# Patient Record
Sex: Female | Born: 1975 | Race: Black or African American | Hispanic: No | Marital: Single | Smoking: Never smoker
Health system: Southern US, Community
[De-identification: ages and names within clinical notes are randomized; demographics above are authoritative.]

## PROBLEM LIST (undated history)

## (undated) DIAGNOSIS — E559 Vitamin D deficiency, unspecified: Principal | ICD-10-CM

## (undated) DIAGNOSIS — T7840XA Allergy, unspecified, initial encounter: Secondary | ICD-10-CM

## (undated) DIAGNOSIS — I219 Acute myocardial infarction, unspecified: Secondary | ICD-10-CM

## (undated) DIAGNOSIS — D649 Anemia, unspecified: Secondary | ICD-10-CM

## (undated) DIAGNOSIS — E119 Type 2 diabetes mellitus without complications: Secondary | ICD-10-CM

## (undated) DIAGNOSIS — N289 Disorder of kidney and ureter, unspecified: Secondary | ICD-10-CM

## (undated) DIAGNOSIS — Z87442 Personal history of urinary calculi: Secondary | ICD-10-CM

## (undated) DIAGNOSIS — Z86718 Personal history of other venous thrombosis and embolism: Secondary | ICD-10-CM

## (undated) HISTORY — DX: Type 2 diabetes mellitus without complications: E11.9

## (undated) HISTORY — DX: Personal history of urinary calculi: Z87.442

## (undated) HISTORY — PX: WISDOM TOOTH EXTRACTION: SHX21

## (undated) HISTORY — DX: Anemia, unspecified: D64.9

## (undated) HISTORY — DX: Acute myocardial infarction, unspecified: I21.9

## (undated) HISTORY — DX: Vitamin D deficiency, unspecified: E55.9

## (undated) HISTORY — DX: Allergy, unspecified, initial encounter: T78.40XA

## (undated) HISTORY — DX: Personal history of other venous thrombosis and embolism: Z86.718

---

## 1999-07-10 HISTORY — PX: CHOLECYSTECTOMY: SHX55

## 2006-07-09 DIAGNOSIS — Z5189 Encounter for other specified aftercare: Secondary | ICD-10-CM

## 2006-07-09 HISTORY — DX: Encounter for other specified aftercare: Z51.89

## 2013-09-01 ENCOUNTER — Ambulatory Visit: Payer: Self-pay | Admitting: Internal Medicine

## 2013-09-15 ENCOUNTER — Ambulatory Visit: Payer: Self-pay | Admitting: Internal Medicine

## 2013-10-06 ENCOUNTER — Encounter: Payer: Self-pay | Admitting: Internal Medicine

## 2013-10-06 ENCOUNTER — Ambulatory Visit (INDEPENDENT_AMBULATORY_CARE_PROVIDER_SITE_OTHER): Payer: 59 | Admitting: Internal Medicine

## 2013-10-06 VITALS — BP 120/88 | HR 85 | Temp 98.1°F | Ht 66.5 in | Wt 191.0 lb

## 2013-10-06 DIAGNOSIS — E1059 Type 1 diabetes mellitus with other circulatory complications: Secondary | ICD-10-CM | POA: Insufficient documentation

## 2013-10-06 DIAGNOSIS — IMO0002 Reserved for concepts with insufficient information to code with codable children: Secondary | ICD-10-CM

## 2013-10-06 DIAGNOSIS — E1065 Type 1 diabetes mellitus with hyperglycemia: Secondary | ICD-10-CM | POA: Insufficient documentation

## 2013-10-06 LAB — LIPID PANEL
Cholesterol: 200 mg/dL (ref 0–200)
HDL: 56.4 mg/dL (ref >39.00)
LDL Cholesterol: 125 mg/dL — ABNORMAL HIGH (ref 0–99)
Total CHOL/HDL Ratio: 4
Triglycerides: 91 mg/dL (ref 0.0–149.0)
VLDL: 18.2 mg/dL (ref 0.0–40.0)

## 2013-10-06 LAB — COMPREHENSIVE METABOLIC PANEL
ALT: 12 U/L (ref 0–35)
AST: 19 U/L (ref 0–37)
Albumin: 3.5 g/dL (ref 3.5–5.2)
Alkaline Phosphatase: 126 U/L — ABNORMAL HIGH (ref 39–117)
BILIRUBIN TOTAL: 0.6 mg/dL (ref 0.3–1.2)
BUN: 11 mg/dL (ref 6–23)
CO2: 23 meq/L (ref 19–32)
Calcium: 8.7 mg/dL (ref 8.4–10.5)
Chloride: 100 mEq/L (ref 96–112)
Creatinine, Ser: 0.9 mg/dL (ref 0.4–1.2)
GFR: 87.41 mL/min (ref >60.00)
GLUCOSE: 351 mg/dL — AB (ref 70–99)
Potassium: 4.2 mEq/L (ref 3.5–5.1)
SODIUM: 131 meq/L — AB (ref 135–145)
TOTAL PROTEIN: 8 g/dL (ref 6.0–8.3)

## 2013-10-06 LAB — MICROALBUMIN / CREATININE URINE RATIO
CREATININE, U: 135.4 mg/dL
Microalb Creat Ratio: 1.8 mg/g (ref 0.0–30.0)
Microalb, Ur: 2.5 mg/dL — ABNORMAL HIGH (ref 0.0–1.9)

## 2013-10-06 LAB — TSH: TSH: 0.47 u[IU]/mL (ref 0.35–5.50)

## 2013-10-06 MED ORDER — INSULIN PEN NEEDLE 32G X 4 MM MISC: Status: DC

## 2013-10-06 MED ORDER — GLUCOSE BLOOD VI STRP: ORAL_STRIP | Status: DC

## 2013-10-06 MED ORDER — INSULIN ASPART 100 UNIT/ML ~~LOC~~ SOLN: SUBCUTANEOUS | Status: DC

## 2013-10-06 MED ORDER — INSULIN GLARGINE 100 UNIT/ML ~~LOC~~ SOLN: 26.0000 [IU] | Freq: Every day | SUBCUTANEOUS | Status: DC

## 2013-10-06 MED ORDER — FREESTYLE LANCETS MISC
Status: DC
Start: 2013-10-06 — End: 2020-09-28

## 2013-10-06 NOTE — Progress Notes (Signed)
Patient ID: Abigail Moran, female   DOB: 1975-12-25, 38 y.o.   MRN: [id number]  HPI: Abigail Moran is a 38 y.o.-year-old female, referred by ObGyn Dr Linda Hedges, for management of DM1, uncontrolled, without complications. She does not have a PCP yet. She had problems with the insurance in the past >> now [facility] since 09/06/2013.  Patient has been diagnosed with diabetes in 1997; she started insulin in 2001. She was on an insulin pump when pregnant >> sugars great but lost the baby at 6 months >> would not want to restart.  Last hemoglobin A1c was: 10% in 08/2013.  She had admissions for DKA multiple times as a teenager. 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 She was on Metformin >> severe N/V. + coriander + cinnamon + slippery elm  + digestive enzymes  Pt checks her sugars 1-2x a day and they are: - am: 85-150 - 2h after b'fast: n/c - before lunch: n/c - 2h after lunch: n/c - before dinner: n/c - 2h after dinner: n/c - bedtime: 220-320-HI - nighttime: n/c No lows. Lowest sugar was 85 in last 3 mo; she has hypoglycemia awareness at 60-70.  Highest sugar was HI.  Pt's meals are mostly vegetarian. Exercise: Zumba, African dance  - no CKD, but no records available. - no lipid panel available - last eye exam was in 8 years. No DR.  - + numbness and tingling in her feet. Cayenne pepper helps.  Pt has FH of DM in father and PGM.   ROS: Constitutional: no weight gain/loss, no fatigue, no subjective hyperthermia/hypothermia Eyes: no blurry vision, no xerophthalmia ENT: no sore throat, no nodules palpated in throat, no dysphagia/odynophagia, no hoarseness Cardiovascular: no CP/SOB/palpitations/leg swelling Respiratory: no cough/SOB Gastrointestinal: no  N/V/D/C Musculoskeletal: no muscle/joint aches Skin: no rashes Neurological: no tremors/numbness/tingling/dizziness Psychiatric: no depression/anxiety  PMH:  - kidney stones  History   Social History  . Marital Status: Single    Spouse Name: N/A    Number of Children: 0   Occupational History  . 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 Smoking status: Never Smoker   . Smokeless tobacco: Not on file  . Alcohol Use: No  . Drug Use: Cannabis occasionally   Meds: only on Lantus and NovoLog - see HPI  Allergies  Allergen Reactions  . Latex     Hives   PE: BP 120/88  Pulse 85  Temp(Src) 98.1 F (36.7 C) (Oral)  Ht 5' 6.5" (1.689 m)  Wt 191 lb (86.637 kg)  BMI 30.37 kg/m2  SpO2 98% Wt Readings from Last 3 Encounters:  10/06/13 191 lb (86.637 kg)   Constitutional: overweight, in NAD Eyes: PERRLA, EOMI, no exophthalmos ENT: moist mucous membranes, no thyromegaly, no cervical lymphadenopathy Cardiovascular: RRR, No MRG Respiratory: CTA B Gastrointestinal: abdomen soft, NT, ND, BS+ Musculoskeletal: no deformities, strength intact in all 4 Skin: moist, warm, no rashes Neurological: no tremor with outstretched hands, DTR normal in all 4  ASSESSMENT: 1. DM1, insulin-dependent, uncontrolled, without complications - refuses a pump  PLAN:  1. Patient with long-standing, uncontrolled diabetes, on basal insulin, but noncompliant with bolus insulin - We discussed about options for treatment, and I suggested  to:  Patient Instructions  Please decrease Lantus from 30 to 26 units qhs Please adjust the Novolog with meals as follows: - small meal: 6 units - medium meal: 8 units  - large meal: 10 units Add the following Sliding scale of NovoLog: - 150-175: + 1 unit  - 176-200: + 2 units  - 201-225: + 3 units  - 226-250: + 4 units  - > 251: + 5 units Please return in 1 month with your sugar log.  Please do not miss insulin doses. When injecting  insulin:  Inject in the abdomen  Rotate the injection sites around the belly button  Change needle for each injection  Keep needle in for 10 sec after last unit of insulin in  Keep the insulin in use out of the fridge  - Strongly advised her to start checking sugars at different times of the day - check 3-4 times a day, rotating checks - given sugar log and advised how to fill it and to bring it at next appt  - given foot care handout and explained the principles  - given instructions for hypoglycemia management "15-15 rule"  - advised for yearly eye exams - check the following today: Orders Placed This Encounter  Procedures  . Comp Met (CMET)  . Lipid Profile  . TSH  . Microalbumin / creatinine urine ratio  - Return to clinic in 1 mo with sugar log   Letter sent: Dear Ms. Westrich,  Below are the results from your recent visit:  COMPREHENSIVE METABOLIC PANEL      Result Value Ref Range   Sodium 131 (*) 135 - 145 mEq/L   Potassium 4.2  3.5 - 5.1 mEq/L   Chloride 100  96 - 112 mEq/L   CO2 23  19 - 32 mEq/L   Glucose, Bld 351 (*) 70 - 99 mg/dL   BUN 11  6 - 23 mg/dL   Creatinine, Ser 0.9  0.4 - 1.2 mg/dL   Total Bilirubin 0.6  0.3 - 1.2 mg/dL   Alkaline Phosphatase 126 (*) 39 - 117 U/L   AST 19  0 - 37 U/L   ALT 12  0 - 35 U/L   Total Protein 8.0  6.0 - 8.3 g/dL   Albumin 3.5  3.5 - 5.2 g/dL   Calcium 8.7  8.4 - 10.5 mg/dL   GFR 87.41  >60.00 mL/min  LIPID PANEL      Result Value Ref Range   Cholesterol 200  0 - 200 mg/dL   Triglycerides 91.0  0.0 - 149.0 mg/dL   HDL 56.40  >39.00 mg/dL   VLDL 18.2  0.0 - 40.0 mg/dL   LDL Cholesterol 125 (*) 0 - 99 mg/dL   Total CHOL/HDL Ratio 4    TSH      Result Value Ref Range   TSH 0.47  0.35 - 5.50 uIU/mL  MICROALBUMIN / CREATININE URINE RATIO      Result Value Ref Range   Microalb, Ur 2.5 (*) 0.0 - 1.9 mg/dL   Creatinine,U 135.4     Microalb Creat Ratio 1.8  0.0 - 30.0 mg/g   The test results show that your diabetes  is very poorly controlled, and the sugar is high, at 351. The urinary proteins are not high, which is great! The cholesterol level is OK, with the bad cholesterol LDL a little high. The thyroid test is great.  If you have any questions or concerns, please don't hesitate to call.  Sincerely,  Philemon Kingdom, MD

## 2013-10-06 NOTE — Patient Instructions (Signed)
Please decrease Lantus from 30 to 26 units qhs Please adjust the Novolog with meals as follows: - small meal: 6 units - medium meal: 8 units  - large meal: 10 units Add the following Sliding scale of NovoLog: - 150-175: + 1 unit  - 176-200: + 2 units  - 201-225: + 3 units  - 226-250: + 4 units  - > 251: + 5 units Please return in 1 month with your sugar log.  Please do not miss insulin doses. When injecting insulin:  Inject in the abdomen  Rotate the injection sites around the belly button  Change needle for each injection  Keep needle in for 10 sec after last unit of insulin in  Keep the insulin in use out of the fridge   Basic Carbohydrate Counting Basic carbohydrate counting is a way to plan meals. It is done by counting the amount of carbohydrate in foods. Foods that have carbohydrates are starches (grains, beans, starchy vegetables) and sweets. Eating carbohydrates increases blood glucose (sugar) levels. People with diabetes use carbohydrate counting to help keep their blood glucose at a normal level.  COUNTING CARBOHYDRATES IN FOODS The first step in counting carbohydrates is to learn how many carbohydrate servings you should have in every meal. A dietitian can plan this for you. After learning the amount of carbohydrates to include in your meal plan, you can start to choose the carbohydrate-containing foods you want to eat.  There are 2 ways to identify the amount of carbohydrates in the foods you eat.  Read the Nutrition Facts panel on food labels. You need 2 pieces of information from the Nutrition Facts panel to count carbohydrates this way:  Serving size.  Total carbohydrate (in grams). Decide how many servings you will be eating. If it is 1 serving, you will be eating the amount of carbohydrate listed on the panel. If you will be eating 2 servings, you will be eating double the amount of carbohydrate listed on the panel.   Learn serving sizes. A serving size of most  carbohydrate-containing foods is about 15 grams (g). Listed below are single serving sizes of common carbohydrate-containing foods:  1 slice bread.   cup unsweetened, dry cereal.   cup hot cereal.   cup rice.   cup mashed potatoes.   cup pasta.  1 cup fresh fruit.   cup canned fruit.  1 cup milk (whole, 2%, or skim).   cup starchy vegetables (peas, corn, or potatoes). Counting carbohydrates this way is similar to looking on the Nutrition Facts panel. Decide how many servings you will eat first. Multiply the number of servings you eat by 15 g. For example, if you have 2 cups of strawberries, you had 2 servings. That means you had 30 g of carbohydrate (2 servings x 15 g = 30 g). CALCULATING CARBOHYDRATES IN A MEAL Sample dinner  3 oz chicken breast.   cup brown rice.   cup corn.  1 cup fat-free milk.  1 cup strawberries with sugar-free whipped topping. Carbohydrate calculation First, identify the foods that contain carbohydrate:  Rice.  Corn.  Milk.  Strawberries. Calculate the number of servings eaten:  2 servings rice.  1 serving corn.  1 serving milk.  1 serving strawberries. Multiply the number of servings by 15 g:  2 servings rice x 15 g = 30 g.  1 serving corn x 15 g = 15 g.  1 serving milk x 15 g = 15 g.  1 serving strawberries x  15 g = 15 g. Add the amounts to find the total carbohydrates eaten: 30 g + 15 g + 15 g + 15 g = 75 g carbohydrate eaten at dinner. Document Released: 06/25/2005 Document Revised: 09/17/2011 Document Reviewed: 05/11/2011 [facility] Patient Information 2014 [place], [place].  Reading Food Labels Foods that are in packaging or containers will often have a Nutrition Facts panel on its side or back. The Nutrition Facts panel provides the nutritional value of the food. This information is helpful when determining healthy food choices. By reading food labels, you will find out the serving size of a food and how many  servings the package has. You will also find information about the calorie and fat content, as well as the amount of carbohydrate, and vitamins and minerals. Food labels are a great reference for you to use to learn about the food you are eating. BREAKING DOWN THE FOOD LABEL Serving Size: The serving size is an amount of food and is often listed in cups, weight, or units. All of the nutrition information about the food is listed according to the serving size. If you double the serving size, you must double the amounts on the label.  Servings per [employer] FoodsContainer or Package: The number of servings in the container is listed here.  Calories: The number of calories in one serving is listed here. Everyone needs a different amount of calories each day. Having calories listed on the label is helpful information for people who would like to keep track of the number of calories they eat to stay at a healthy weight. Calories from Fat:  The number of calories that come from fat in one serving are listed here.  NUTRIENTS THAT ARE LISTED ON THE FOOD LABEL.   Percent Daily Value: The food label helps you know if you are getting the amounts of nutrients you need each day by the percent daily value. It tells you how much of your daily values of each nutrient are provided by one serving of the food. The percent daily value is based on a 2000 calorie diet. You may need more or less than 2000 calories each day.  Total Fat: The total amount of fat in one serving is listed here. The number is shown in grams (g). This information is important for people who want to keep track of the amount of fat in their diet. Foods with high amounts of fat usually have higher calories and may lead to weight gain.  Saturated Fat:  The amount of saturated fat in one serving is listed here. It is also shown in grams. Saturated fat is one type of fat that is found in food. It increases the amount of blood cholesterol more than other types of fat  found in food. So saturated fat should be limited in the diet to less than 7 percent of total calories each day for most people. This means that if a person eats 2000 calories each day, they should eat less than 140 calories from saturated fat.  Trans Fat: The amount of trans fat in one serving is listed here. It is also shown in grams. Trans fat is another type of fat that is found in food. It should also be limited to less than 2 grams per day because it increases blood cholesterol.  Cholesterol: The amount of cholesterol in one serving is listed here. It is shown in milligrams (mg). Cholesterol should be limited to no more than 200 mg each day.  Sodium: The  amount of sodium in one serving is listed here. It is shown in milligrams. American Heart Association recommends that sodium should be limited to 1500mg /day. This recommended level of sodium was recently lowered from 2400mg /day.  Total Carbohydrate: The amount of carbohydrate in one serving is listed here. It is shown in grams. This information is important for people with diabetes because they need to manage the amount of carbohydrate they eat. Carbohydrate changes the amount of glucose or sugar in the blood and diabetics do not want that amount to be too high or too low.  Dietary Fiber:  The amount of dietary fiber in one serving is listed here. It is shown in grams. Fiber is a type of carbohydrate. Most people should eat 25 grams of dietary fiber each day.  Sugars: The amount of sugar in one serving is listed here. It is shown in grams. Sugars are also a type of carbohydrate. This value includes both naturally occurring sugars from fruit and milk and added sugars such as honey or table sugar.  Protein: The amount of protein in one serving is listed here. It is shown in grams.  Vitamins and Minerals: Food labels list vitamin A, vitamin C, calcium and iron. They are all shown as a percent of the daily need one serving of the food provides.  For example, if 15% is listed next to iron it means that one serving of that food will give you 15% of the total amount of iron you need for one day.  Calories per Gram: Some food labels will list the number of calories that are in each gram or protein, carbohydrate and fat. Protein has four calories per gram, carbohydrate has four calories per gram, and fat has 9 calories per gram.  Ingredients: Food labels will list each ingredient in the food. The first ingredient listed is the ingredient that the food has the most of. The ingredients are listed in the order of their amount from highest to lowest.  Contains: Food labels may also include this portion of the label as a food allergen warning. Listed here are ingredients that can cause allergies in some people. Examples of ingredients that are listed are wheat, dairy, eggs, soy and nuts. If a person knows that are allergic to one of these ingredients they will know not to eat the food in the container. Information from www.eatright.[place] Servant Nutritional Analysis Database, ADA Nutrition Care Manual. Document Released: 06/25/2005 Document Revised: 09/17/2011 Document Reviewed: 11/08/2008 [facility] Patient Information 2014 [place], [place].

## 2013-11-03 ENCOUNTER — Ambulatory Visit: Payer: 59 | Admitting: Internal Medicine

## 2013-11-17 ENCOUNTER — Encounter: Payer: Self-pay | Admitting: Internal Medicine

## 2013-11-17 ENCOUNTER — Ambulatory Visit (INDEPENDENT_AMBULATORY_CARE_PROVIDER_SITE_OTHER): Payer: 59 | Admitting: Internal Medicine

## 2013-11-17 VITALS — BP 112/68 | HR 88 | Temp 97.9°F | Resp 12 | Wt 194.0 lb

## 2013-11-17 DIAGNOSIS — E1065 Type 1 diabetes mellitus with hyperglycemia: Secondary | ICD-10-CM

## 2013-11-17 DIAGNOSIS — IMO0002 Reserved for concepts with insufficient information to code with codable children: Secondary | ICD-10-CM

## 2013-11-17 NOTE — Patient Instructions (Signed)
Continue Lantus 26 units qhs Continue Novolog with meals as follows: - small meal: 6 units - medium meal: 8 units  - large meal: 10 units Continue Sliding scale of NovoLog: - 150-175: + 1 unit  - 176-200: + 2 units  - 201-225: + 3 units  - 226-250: + 4 units  - > 251: + 5 units Please return in 1 month with your sugar log.  Please do not miss insulin doses or meals

## 2013-11-17 NOTE — Progress Notes (Addendum)
Patient ID: Abigail Moran, female   DOB: Oct 26, 1975, 38 y.o.   MRN: [id number]  HPI: Abigail Moran is a 38 y.o.-year-old female, initially referred by ObGyn Dr Mitchel Honour, for management of DM1, dx 1997, stated insulin in 2001, uncontrolled, without complications. She does not have a PCP yet. She had problems with the insurance in the past >> now [facility] since 09/06/2013. Last visit 1.5 mo ago.  She was on an insulin pump when pregnant >> sugars great but lost the baby at 6 months >> would not want to restart.   Last hemoglobin A1c was:  10% in 08/2013.  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 - small meal: 6 units - medium meal: 8 units  - large meal: 10 units Still taking the mealtime insulin 1-2x a day, rather than  - Sliding scale of NovoLog: - 150-175: + 1 unit  - 176-200: + 2 units  - 201-225: + 3 units  - 226-250: + 4 units  - > 251: + 5 units She was on Metformin >> severe N/V. + coriander + cinnamon + slippery elm  + digestive enzymes  Pt checks her sugars 1-2x a day and they are: - am: 85-150 >> 60-100 - 2h after b'fast: n/c - before lunch: n/c - 2h after lunch: n/c - before dinner: n/c - 2h after dinner: n/c - bedtime: 220-320-HI >> 200-HI (if HI, takes 10 units of NovoLog) - nighttime: n/c No lows. Lowest sugar was 60 in last 3 mo; she has hypoglycemia awareness at 60-70.  Highest sugar was HI.  Pt's meals are mostly vegetarian. Exercise: Zumba, African dance  - no CKD: Lab Results  Component Value Date   BUN 11 10/06/2013   CREATININE 0.9 10/06/2013  Last ACR in 09/2013 >> normal. - Lipids: Lab Results  Component Value Date   CHOL 200 10/06/2013   HDL 56.40 10/06/2013   LDLCALC 125* 10/06/2013   TRIG 91.0 10/06/2013   CHOLHDL 4 10/06/2013   - last eye exam was in 8 years. No DR.  - + numbness and tingling in her  feet. Cayenne pepper helps. She had admissions for DKA multiple times as a teenager. 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   I reviewed pt's medications, allergies, PMH, social hx, family hx and no changes required, except as mentioned above.  ROS: Constitutional: no weight gain/loss, no fatigue, no subjective hyperthermia/hypothermia Eyes: no blurry vision, no xerophthalmia ENT: no sore throat, no nodules palpated in throat, no dysphagia/odynophagia, no hoarseness Cardiovascular: no CP/SOB/palpitations/leg swelling Respiratory: no cough/SOB Gastrointestinal: no N/V/D/C Musculoskeletal: no muscle/joint aches Skin: no rashes Neurological: no tremors/numbness/tingling/dizziness  PE: BP 112/68  Pulse 88  Temp(Src) 97.9 F (36.6 C) (Oral)  Resp 12  Wt 194 lb (87.998 kg)  SpO2 98% Wt Readings from Last 3 Encounters:  11/17/13 194 lb (87.998 kg)  10/06/13 191 lb (86.637 kg)   Constitutional: overweight, in NAD Eyes: PERRLA, EOMI, no exophthalmos ENT: moist mucous membranes, no thyromegaly, no cervical lymphadenopathy Cardiovascular: RRR, No MRG Respiratory: CTA B Gastrointestinal: abdomen soft, NT, ND, BS+ Musculoskeletal: no deformities, strength intact in all 4 Skin: moist, warm, no rashes Neurological: no tremor with outstretched hands, DTR normal in all 4  ASSESSMENT: 1. DM1, insulin-dependent, uncontrolled, without  complications - refuses a pump  PLAN:  1. Patient with long-standing, uncontrolled diabetes, on basal insulin, but noncompliant with bolus insulin. She tells me she now makes her own schedule and will start leaving enough time for meals and CBG checks. - I suggested to:  Patient Instructions  Continue Lantus 26 units qhs Continue Novolog with meals as follows: - small meal: 6 units - medium meal: 8 units  - large meal: 10 units Continue Sliding scale of NovoLog: - 150-175: + 1 unit  - 176-200: + 2 units  -  201-225: + 3 units  - 226-250: + 4 units  - > 251: + 5 units Please return in 1 month with your sugar log.  Please do not miss insulin doses or meals - Strongly advised her to start checking sugars at different times of the day - check 3-4 times a day, rotating checks - advised for yearly eye exams  Received labs from ObGyn: - HbA1c in 08/03/2013: 12.2% - Hb 9.6/HT 30.6, MCV 81 (78-100) - iron 47 (42-145), TIBC 378 (250-470) - CMP with Glu 263, A phos 178 (39-117), BUN/Cr 16/0.98, OTW normal - TSH 1.059

## 2013-12-15 ENCOUNTER — Ambulatory Visit: Payer: 59 | Admitting: Internal Medicine

## 2013-12-29 ENCOUNTER — Ambulatory Visit: Payer: 59 | Admitting: Internal Medicine

## 2014-01-26 ENCOUNTER — Ambulatory Visit: Payer: 59 | Admitting: Internal Medicine

## 2014-02-08 ENCOUNTER — Ambulatory Visit: Payer: 59 | Admitting: Internal Medicine

## 2014-02-26 ENCOUNTER — Telehealth: Payer: Self-pay

## 2014-02-26 NOTE — Telephone Encounter (Signed)
LVM for pt to call and schedule follow up appointment with Dr. Elvera Lennox.  Diabetic Bundle pt.

## 2014-03-18 ENCOUNTER — Telehealth: Payer: Self-pay

## 2014-03-18 NOTE — Telephone Encounter (Signed)
Pt called concerning her insulin price. Pt states that she went to her pharmacy and was advised that her Novolog required a PA and her Lantus would cost her 300$. Pharmacy did not inform pt what covered alternatives were for the Novolog.   Please advise pt, Thanks!

## 2014-03-18 NOTE — Telephone Encounter (Signed)
Called pharmacy. Waiting on return call. Going to see if there is a alternative that pt's insurance will cover.

## 2014-03-18 NOTE — Telephone Encounter (Signed)
Yes, let's send those.

## 2014-03-18 NOTE — Telephone Encounter (Signed)
Pharmacy has not returned my call. They faxed a PA for Novolog for pt. Please read notes below. Is it ok to send in an rx for Humalog and Levemir to see if insurance will cover these? Please advise.

## 2014-03-19 ENCOUNTER — Other Ambulatory Visit: Payer: Self-pay | Admitting: *Deleted

## 2014-03-19 MED ORDER — INSULIN LISPRO 100 UNIT/ML (KWIKPEN): PEN_INJECTOR | SUBCUTANEOUS | Status: DC

## 2014-03-19 MED ORDER — INSULIN DETEMIR 100 UNIT/ML FLEXPEN: PEN_INJECTOR | SUBCUTANEOUS | Status: DC

## 2014-03-19 NOTE — Telephone Encounter (Signed)
Change to Humalog and Levemir.

## 2014-05-10 ENCOUNTER — Encounter: Payer: Self-pay | Admitting: Internal Medicine

## 2014-05-10 ENCOUNTER — Ambulatory Visit (INDEPENDENT_AMBULATORY_CARE_PROVIDER_SITE_OTHER): Payer: 59 | Admitting: Internal Medicine

## 2014-05-10 VITALS — BP 102/60 | HR 86 | Temp 98.2°F | Resp 12 | Wt 181.6 lb

## 2014-05-10 DIAGNOSIS — E1065 Type 1 diabetes mellitus with hyperglycemia: Secondary | ICD-10-CM

## 2014-05-10 DIAGNOSIS — IMO0002 Reserved for concepts with insufficient information to code with codable children: Secondary | ICD-10-CM

## 2014-05-10 LAB — HEMOGLOBIN A1C: Hgb A1c MFr Bld: 13 % — ABNORMAL HIGH (ref 4.6–6.5)

## 2014-05-10 MED ORDER — INSULIN NPH (HUMAN) (ISOPHANE) 100 UNIT/ML ~~LOC~~ SUSP: SUBCUTANEOUS | Status: DC

## 2014-05-10 MED ORDER — INSULIN REGULAR HUMAN 100 UNIT/ML IJ SOLN: 6.0000 [IU] | Freq: Three times a day (TID) | INTRAMUSCULAR | Status: DC

## 2014-05-10 NOTE — Patient Instructions (Addendum)
Please stop the Lantus and Humalog and start:  Insulin Before breakfast Before lunch Before dinner  Regular - small meal: 6 units  - medium meal: 8 units  - large meal: 10 units -  small meal: 6 units  - medium meal: 8 units  - large meal: 10 units - small meal: 6 units  - medium meal: 8 units  - large meal: 10 units  NPH 15  10   Please continue the Sliding scale for Regular insulin: - 150-175: + 1 unit  - 176-200: + 2 units  - 201-225: + 3 units  - 226-250: + 4 units  - > 251: + 5 units  Please inject the insulin 30 min before meals.  Please stop at the lab.

## 2014-05-10 NOTE — Progress Notes (Signed)
Patient ID: Abigail Moran, female   DOB: 07-25-1975, 38 y.o.   MRN: [id number]  HPI: Abigail Moran is a 38 y.o.-year-old female, initially referred by ObGyn Dr Mitchel Honour, for management of DM1, dx 1997, stated insulin in 2001, uncontrolled, without complications. She does not have a PCP yet. She had problems with the insurance in the past >> now [facility] since 09/06/2013. Last visit 6 mo ago.  Last hemoglobin A1c was:  10% in 08/2013.  Pt is on a regimen of: - Lantus 30 units qhs >> 26 units >> 35 units  - she needs to pay 300$/mo only for Lantus - she has been getting samples She ran out of Humalog 1 mo ago  - was taking 10 units ac >> mostly once a day! - small meal: 6 units - medium meal: 8 units  - large meal: 10 units Still taking the mealtime insulin 1-2x a day, rather than  - Sliding scale of NovoLog: - 150-175: + 1 unit  - 176-200: + 2 units  - 201-225: + 3 units  - 226-250: + 4 units  - > 251: + 5 units She was on Metformin >> severe N/V. + coriander + cinnamon + slippery elm  + digestive enzymes  She was on an insulin pump when pregnant >> sugars great but lost the baby at 6 months >> would not want to restart.   Pt checks her sugars 1-2x a day and they are: - am: 85-150 >> 60-100 >> 60-100 - 2h after b'fast: n/c - before lunch: n/c >> 170-180 - 2h after lunch: n/c - before dinner: n/c - 2h after dinner: n/c - bedtime: 220-320-HI >> 200-HI (if HI, takes 10 units of NovoLog) >> 270-300s - nighttime: n/c No lows. Lowest sugar was 50-60 in last 6 mo; she has hypoglycemia awareness at 60-70.  Highest sugar was HI.  Pt's meals are mostly vegetarian. Exercise: Zumba, African dance  - no CKD: Lab Results  Component Value Date   BUN 11 10/06/2013   CREATININE 0.9 10/06/2013  Last ACR in 09/2013 >> normal. - Lipids: Lab Results  Component Value Date   CHOL 200 10/06/2013   HDL 56.40 10/06/2013   LDLCALC 125* 10/06/2013   TRIG 91.0 10/06/2013   CHOLHDL 4  10/06/2013   - last eye exam was 8 years ago. No DR.  - + numbness and tingling in her feet. Cayenne pepper helps. She had admissions for DKA multiple times as a teenager. 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   I reviewed pt's medications, allergies, PMH, social hx, family hx and no changes required, except as mentioned above.  ROS: Constitutional: no weight gain/loss, no fatigue, no subjective hyperthermia/hypothermia Eyes: no blurry vision, no xerophthalmia ENT: no sore throat, no nodules palpated in throat, no dysphagia/odynophagia, no hoarseness Cardiovascular: no CP/SOB/palpitations/leg swelling Respiratory: no cough/SOB Gastrointestinal: no N/V/D/C Musculoskeletal: no muscle/joint aches Skin: no rashes Neurological: no tremors/numbness/tingling/dizziness  PE: BP 102/60 mmHg  Pulse 86  Temp(Src) 98.2 F (36.8 C) (Oral)  Resp 12  Wt 181 lb 9.6 oz (82.373 kg)  SpO2 98% Wt Readings from Last 3 Encounters:  05/10/14 181 lb 9.6 oz (82.373 kg)  11/17/13 194 lb (87.998 kg)  10/06/13 191 lb (86.637 kg)   Constitutional: overweight, in NAD Eyes: PERRLA, EOMI, no exophthalmos ENT: moist mucous membranes, no thyromegaly, no cervical lymphadenopathy Cardiovascular: RRR, No MRG Respiratory: CTA B Gastrointestinal: abdomen soft, NT, ND, BS+ Musculoskeletal:  no deformities, strength intact in all 4 Skin: moist, warm, no rashes Neurological: no tremor with outstretched hands, DTR normal in all 4  ASSESSMENT: 1. DM1, insulin-dependent, uncontrolled, without complications - refuses a pump  [facility] - HbA1c: 12.2% - Hb 9.6/HT 30.6, MCV 81 (78-100) - iron 47 (42-145), TIBC 378 (250-470) - CMP with Glu 263, A phos 178 (39-117), BUN/Cr 16/0.98, OTW normal - TSH 1.059  PLAN:  1. Patient with long-standing, uncontrolled diabetes, on basal insulin, off mealtime insulin >> cannot afford it. - I suggested to  switch to NPH-regular insulin - which is not ideal for DM1, but the most accessible one...:  Patient Instructions   Please stop the Lantus and Humalog and start:  Insulin Before breakfast Before lunch Before dinner  Regular - small meal: 6 units  - medium meal: 8 units  - large meal: 10 units -  small meal: 6 units  - medium meal: 8 units  - large meal: 10 units - small meal: 6 units  - medium meal: 8 units  - large meal: 10 units  NPH 15  10   Please continue the Sliding scale for Regular insulin: - 150-175: + 1 unit  - 176-200: + 2 units  - 201-225: + 3 units  - 226-250: + 4 units  - > 251: + 5 units  Please inject the insulin 30 min before meals.  Please stop at the lab.  - will check a HbA1c - Strongly advised her to start checking sugars at different times of the day - check 3-4 times a day, rotating checks - advised for yearly eye exams >> needs one! - refuses flu vaccine today Please return in 1.5 month with your sugar log.   Office Visit on 05/10/2014  Component Date Value Ref Range Status  . Hgb A1c MFr Bld 05/10/2014 13.0* 4.6 - 6.5 % Final   Glycemic Control Guidelines for People with Diabetes:Non Diabetic:  <6%Goal of Therapy: <7%Additional Action Suggested:  >8%    HbA1c very high, as expected

## 2014-06-21 ENCOUNTER — Ambulatory Visit (INDEPENDENT_AMBULATORY_CARE_PROVIDER_SITE_OTHER): Payer: 59 | Admitting: Internal Medicine

## 2014-06-21 ENCOUNTER — Encounter: Payer: Self-pay | Admitting: Internal Medicine

## 2014-06-21 VITALS — BP 104/68 | HR 104 | Temp 98.2°F | Resp 12 | Wt 179.0 lb

## 2014-06-21 DIAGNOSIS — E1065 Type 1 diabetes mellitus with hyperglycemia: Secondary | ICD-10-CM

## 2014-06-21 DIAGNOSIS — IMO0002 Reserved for concepts with insufficient information to code with codable children: Secondary | ICD-10-CM

## 2014-06-21 NOTE — Progress Notes (Signed)
Patient ID: Abigail Moran, female   DOB: Aug 17, 1975, 38 y.o.   MRN: [id number]  HPI: Abigail Moran is a 38 y.o.-year-old female, initially referred by ObGyn Dr Mitchel Honour, for management of DM1, dx 1997, stated insulin in 2001, uncontrolled, without complications. She does not have a PCP yet. She had problems with the insurance in the past >> now [facility] since 09/06/2013. Last visit 1 mo ago.  Last hemoglobin A1c was:  Lab Results  Component Value Date   HGBA1C 13.0* 05/10/2014  HbA1c 10% in 08/2013.  Pt was on a regimen of: - Lantus 30 units qhs >> 26 units >> 35 units  - she needs to pay 300$/mo only for Lantus - she has been getting samples She ran out of Humalog 1 mo ago  - was taking 10 units ac >> mostly once a day! - small meal: 6 units - medium meal: 8 units  - large meal: 10 units Still taking the mealtime insulin 1-2x a day, rather than  - Sliding scale of NovoLog: - 150-175: + 1 unit  - 176-200: + 2 units  - 201-225: + 3 units  - 226-250: + 4 units  - > 251: + 5 units She was on Metformin >> severe N/V. + coriander + cinnamon + slippery elm  + digestive enzymes  She was on an insulin pump when pregnant >> sugars great but lost the baby at 6 months >> would not want to restart.   Due to price, we switched to:  Insulin Before breakfast Before lunch Before dinner  Regular - small meal: 6 units  - medium meal: 8 units  - large meal: 10 units - small meal: 6 units  - medium meal: 8 units  - large meal: 10-12 units - small meal: 6 units  - medium meal: 8 units  - large meal: 10 units  NPH 15  10  Please continue the Sliding scale for Regular insulin: - 150-175: + 1 unit  - 176-200: + 2 units  - 201-225: + 3 units  - 226-250: + 4 units  - > 251: + 5 units She can take her doses of insulin later as she forgets... But does take it when she remembers.  Pt checks her sugars 3x a day and they are better - but no log or meter: - am: 85-150 >> 60-100 >> 60-100 >>  80-100 - 2h after b'fast: n/c - before lunch: n/c >> 170-180 >> smoothie: 150 - 2h after lunch: n/c - before dinner: n/c >> 200s - 2h after dinner: n/c - bedtime: 220-320-HI >> 200-HI (if HI, takes 10 units of NovoLog) >> 270-300s >> 90-140 - nighttime: n/c No lows. Lowest sugar was 50-60 in last 6 mo; she has hypoglycemia awareness at 60-70.  Highest sugar was HI.  Pt's meals are mostly vegetarian. She will start a new diet, reducing portions. Exercise: Zumba, African dance  - no CKD: Lab Results  Component Value Date   BUN 11 10/06/2013   CREATININE 0.9 10/06/2013  Last ACR in 09/2013 >> normal. - Lipids: Lab Results  Component Value Date   CHOL 200 10/06/2013   HDL 56.40 10/06/2013   LDLCALC 125* 10/06/2013   TRIG 91.0 10/06/2013   CHOLHDL 4 10/06/2013   - last eye exam was 8 years ago. No DR. She will switch to a new insurance in January.  - + numbness and tingling in her feet. Cayenne pepper helps. She had admissions for DKA multiple times as a  teenager. 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   I reviewed pt's medications, allergies, PMH, social hx, family hx and no changes required, except as mentioned above.  ROS: Constitutional: no weight gain/loss, no fatigue, no subjective hyperthermia/hypothermia Eyes: no blurry vision, no xerophthalmia ENT: no sore throat, no nodules palpated in throat, no dysphagia/odynophagia, no hoarseness Cardiovascular: no CP/SOB/palpitations/leg swelling Respiratory: no cough/SOB Gastrointestinal: no N/V/D/C Musculoskeletal: no muscle/joint aches Skin: no rashes Neurological: no tremors/numbness/tingling/dizziness  PE: BP 104/68 mmHg  Pulse 104  Temp(Src) 98.2 F (36.8 C) (Oral)  Resp 12  Wt 179 lb (81.194 kg)  SpO2 98% Wt Readings from Last 3 Encounters:  06/21/14 179 lb (81.194 kg)  05/10/14 181 lb 9.6 oz (82.373 kg)  11/17/13 194 lb (87.998 kg)   Constitutional: overweight, in  NAD Eyes: PERRLA, EOMI, no exophthalmos ENT: moist mucous membranes, no thyromegaly, no cervical lymphadenopathy Cardiovascular: RRR, No MRG Respiratory: CTA B Gastrointestinal: abdomen soft, NT, ND, BS+ Musculoskeletal: no deformities, strength intact in all 4 Skin: moist, warm, no rashes Neurological: no tremor with outstretched hands, DTR normal in all 4  ASSESSMENT: 1. DM1, insulin-dependent, uncontrolled, without complications - refuses a pump  Received labs from ObGyn 08/03/2013: - HbA1c: 12.2% - Hb 9.6/HT 30.6, MCV 81 (78-100) - iron 47 (42-145), TIBC 378 (250-470) - CMP with Glu 263, A phos 178 (39-117), BUN/Cr 16/0.98, OTW normal - TSH 1.059  PLAN:  1. Patient with long-standing, uncontrolled diabetes, on basal-bolus insulin, now with better control after switching to NPH-R which she can afford. She still forgets insulin inj >> takes them later than 30 min, but mostly, her compliance increased since last visit. She still has high sugars before dinner. Will increase the R a little, but not change the regimen completely especially since she has a new meal plan and plans to reduce portions - I suggested to: Patient Instructions   Please use the following R doses (underlined)  Insulin Before breakfast Before lunch Before dinner  Regular - small meal: 6 units  - medium meal: 8 units  - large meal: 10 units - small meal: 8 units  - medium meal: 10 units  - large meal: 12-14 units - small meal: 6 units  - medium meal: 8 units  - large meal: 10 units  NPH 15  10   Please continue the Sliding scale for Regular insulin: - 150-175: + 1 unit  - 176-200: + 2 units  - 201-225: + 3 units  - 226-250: + 4 units  - > 251: + 5 units  - Strongly advised her to start checking sugars at different times of the day - check 3-4 times a day, rotating checks - advised for yearly eye exams >> needs one! >> after Jan 1 - refuses flu vaccine today Please return in 1.5 month with your sugar  log.

## 2014-06-21 NOTE — Patient Instructions (Signed)
Patient Instructions   Please use the following R doses (underlined)  Insulin Before breakfast Before lunch Before dinner  Regular - small meal: 6 units  - medium meal: 8 units  - large meal: 10 units - small meal: 8 units  - medium meal: 10 units  - large meal: 12-14 units - small meal: 6 units  - medium meal: 8 units  - large meal: 10 units  NPH 15  10   Please continue the Sliding scale for Regular insulin: - 150-175: + 1 unit  - 176-200: + 2 units  - 201-225: + 3 units  - 226-250: + 4 units  - > 251: + 5 units

## 2014-07-09 HISTORY — PX: CARDIAC CATHETERIZATION: SHX172

## 2014-08-16 ENCOUNTER — Encounter: Payer: Self-pay | Admitting: Internal Medicine

## 2014-08-16 ENCOUNTER — Ambulatory Visit (INDEPENDENT_AMBULATORY_CARE_PROVIDER_SITE_OTHER): Payer: 59 | Admitting: Internal Medicine

## 2014-08-16 VITALS — BP 108/62 | HR 96 | Temp 97.6°F | Resp 12 | Wt 183.0 lb

## 2014-08-16 DIAGNOSIS — IMO0002 Reserved for concepts with insufficient information to code with codable children: Secondary | ICD-10-CM

## 2014-08-16 DIAGNOSIS — E1065 Type 1 diabetes mellitus with hyperglycemia: Secondary | ICD-10-CM

## 2014-08-16 LAB — HEMOGLOBIN A1C: HEMOGLOBIN A1C: 12.5 % — AB (ref 4.6–6.5)

## 2014-08-16 MED ORDER — INSULIN GLARGINE 100 UNIT/ML SOLOSTAR PEN: 30.0000 [IU] | PEN_INJECTOR | Freq: Every day | SUBCUTANEOUS | Status: DC

## 2014-08-16 MED ORDER — INSULIN ASPART 100 UNIT/ML FLEXPEN: 6.0000 [IU] | PEN_INJECTOR | Freq: Three times a day (TID) | SUBCUTANEOUS | Status: DC

## 2014-08-16 NOTE — Progress Notes (Signed)
Patient ID: Abigail Moran, female   DOB: [birth year], 39 y.o.   MRN: [id number]  HPI: Abigail Moran is a 39 y.o.-year-old female, initially referred by ObGyn Dr Mitchel Honour, for management of DM1, dx 1997, stated insulin in 2001, uncontrolled, without complications. She does not have a PCP yet. She had problems with the insurance in the past >> now [facility] since 09/06/2013. Last visit 2 mo ago.  She had gastroenteritis episode >> slowly started to eat solid foods.   Last hemoglobin A1c was:  Lab Results  Component Value Date   HGBA1C 13.0* 05/10/2014  HbA1c 10% in 08/2013.  Pt was on a regimen of: - Lantus 30 units qhs >> 26 units >> 35 units  - she needs to pay 300$/mo only for Lantus - she has been getting samples She ran out of Humalog 1 mo ago  - was taking 10 units ac >> mostly once a day! - small meal: 6 units - medium meal: 8 units  - large meal: 10 units Still taking the mealtime insulin 1-2x a day, rather than  - Sliding scale of NovoLog: - 150-175: + 1 unit  - 176-200: + 2 units  - 201-225: + 3 units  - 226-250: + 4 units  - > 251: + 5 units She was on Metformin >> severe N/V. + coriander + cinnamon + slippery elm  + digestive enzymes  She was on an insulin pump when pregnant >> sugars great but lost the baby at 6 months >> would not want to restart.   Due to price, we switched to:  Insulin Before breakfast Before lunch Before dinner  Regular - small meal: 6 units  - medium meal: 8 units  - large meal: 10 units - small meal: 8 units  - medium meal: 10 units  - large meal: 12-14 units - small meal: 6 units  - medium meal: 8 units  - large meal: 10 units  NPH 15  10   Please continue the Sliding scale for Regular insulin: - 150-175: + 1 unit  - 176-200: + 2 units  - 201-225: + 3 units  - 226-250: + 4 units  - > 251: + 5 units  However, she wants to go back to lantus and Novolog now that she has a better insurance plan.  Pt was checking her sugars 3x a day  (not lately) and they were higher recently - but no log or meter. We reviewed the sugars from last visit: - am: 85-150 >> 60-100 >> 60-100 >> 80-100 - 2h after b'fast: n/c - before lunch: n/c >> 170-180 >> smoothie: 150 - 2h after lunch: n/c - before dinner: n/c >> 200s - 2h after dinner: n/c - bedtime: 220-320-HI >> 200-HI (if HI, takes 10 units of NovoLog) >> 270-300s >> 90-140 - nighttime: n/c No lows. Lowest sugar was 50-60 in last 6 mo; she has hypoglycemia awareness at 60-70.   Pt's meals are mostly vegetarian. She will start a new diet, reducing portions. Exercise: Zumba, African dance  - no CKD: Lab Results  Component Value Date   BUN 11 10/06/2013   CREATININE 0.9 10/06/2013  Last ACR in 09/2013 >> normal. - Lipids: Lab Results  Component Value Date   CHOL 200 10/06/2013   HDL 56.40 10/06/2013   LDLCALC 125* 10/06/2013   TRIG 91.0 10/06/2013   CHOLHDL 4 10/06/2013   - last eye exam was 8 years ago. No DR. Now with her new insurance >> will get  a new eye exam. - + numbness and tingling in her feet. Cayenne pepper helps.  She had admissions for DKA multiple times as a teenager. 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   I reviewed pt's medications, allergies, PMH, social hx, family hx, and changes were documented in the history of present illness. Otherwise, unchanged from my initial visit note.  ROS: Constitutional: no weight gain/loss, no fatigue, no subjective hyperthermia/hypothermia Eyes: no blurry vision, no xerophthalmia ENT: no sore throat, no nodules palpated in throat, no dysphagia/odynophagia, no hoarseness Cardiovascular: no CP/SOB/palpitations/+ B leg swelling Respiratory: + cough/no SOB Gastrointestinal: + N/+ V/no D/C Musculoskeletal: no muscle/joint aches Skin: no rashes Neurological: no tremors/numbness/tingling/dizziness  PE: BP 108/62 mmHg  Pulse 96  Temp(Src) 97.6 F (36.4 C) (Oral)  Resp 12  Wt  183 lb (83.008 kg)  SpO2 99% Wt Readings from Last 3 Encounters:  08/16/14 183 lb (83.008 kg)  06/21/14 179 lb (81.194 kg)  05/10/14 181 lb 9.6 oz (82.373 kg)   Constitutional: overweight, in NAD Eyes: PERRLA, EOMI, no exophthalmos ENT: moist mucous membranes, no thyromegaly, no cervical lymphadenopathy Cardiovascular: RRR, No MRG, ++ pitting B LE edema Respiratory: CTA B Gastrointestinal: abdomen soft, NT, ND, BS+ Musculoskeletal: no deformities, strength intact in all 4 Skin: moist, warm, no rashes Neurological: no tremor with outstretched hands, DTR normal in all 4  ASSESSMENT: 1. DM1, insulin-dependent, uncontrolled, without complications - refuses a pump  Received labs from [facility] 08/03/2013: - HbA1c: 12.2% - Hb 9.6/HT 30.6, MCV 81 (78-100) - iron 47 (42-145), TIBC 378 (250-470) - CMP with Glu 263, A phos 178 (39-117), BUN/Cr 16/0.98, OTW normal - TSH 1.059  PLAN:  1. Patient with long-standing, uncontrolled diabetes, on basal-bolus insulin, with h/o noncompliance. She mostly relies on the basal insulin and does not bolus frequently - I suggested to: Patient Instructions  Please stop N and R insulin and start: - Lantus 30 units at bedtime. - NovoLog 15 min before each meal: - small meal: 6 units  - medium meal: 8 units  - large meal: 10 units Please continue the Sliding scale for NovoLog insulin: - 150-175: + 1 unit  - 176-200: + 2 units  - 201-225: + 3 units  - 226-250: + 4 units  - > 251: + 5 units  Please stop at the lab.  Please return in 1.5 month with your sugar log.   - Strongly advised her to start checking sugars at different times of the day - check 3-4 times a day, rotating checks - advised for yearly eye exams >> needs one!  - refused flu vaccine - check Hba1c today - Please return in 1.5 month with your sugar log. (goes on a cruise at the end of the mo)  Office Visit on 08/16/2014  Component Date Value Ref Range Status  . Hgb A1c MFr Bld  08/16/2014 12.5* 4.6 - 6.5 % Final   Glycemic Control Guidelines for People with Diabetes:Non Diabetic:  <6%Goal of Therapy: <7%Additional Action Suggested:  >8%    HbA1c terrible, as expected.

## 2014-08-16 NOTE — Patient Instructions (Signed)
Please stop N and R insulin and start: - Lantus 30 units at bedtime. - NovoLog 15 min before each meal: - small meal: 6 units  - medium meal: 8 units  - large meal: 10 units Please continue the Sliding scale for NovoLog insulin: - 150-175: + 1 unit  - 176-200: + 2 units  - 201-225: + 3 units  - 226-250: + 4 units  - > 251: + 5 units  Please stop at the lab.  Please return in 1.5 month with your sugar log.

## 2014-08-17 ENCOUNTER — Other Ambulatory Visit: Payer: Self-pay | Admitting: *Deleted

## 2014-08-17 MED ORDER — INSULIN LISPRO 100 UNIT/ML (KWIKPEN)
6.0000 [IU] | PEN_INJECTOR | Freq: Three times a day (TID) | SUBCUTANEOUS | Status: DC
Start: 2014-08-17 — End: 2014-09-03

## 2014-08-17 NOTE — Telephone Encounter (Signed)
Ins does not cover Novolog. Switching to Humalog pen.

## 2014-09-03 ENCOUNTER — Other Ambulatory Visit: Payer: Self-pay | Admitting: *Deleted

## 2014-09-03 MED ORDER — INSULIN LISPRO 100 UNIT/ML (KWIKPEN)
6.0000 [IU] | PEN_INJECTOR | Freq: Three times a day (TID) | SUBCUTANEOUS | Status: DC
Start: 2014-09-03 — End: 2015-05-19

## 2014-09-13 ENCOUNTER — Telehealth: Payer: Self-pay | Admitting: Family

## 2014-09-13 ENCOUNTER — Ambulatory Visit (INDEPENDENT_AMBULATORY_CARE_PROVIDER_SITE_OTHER): Payer: 59 | Admitting: Family

## 2014-09-13 ENCOUNTER — Encounter: Payer: Self-pay | Admitting: Family

## 2014-09-13 VITALS — BP 120/90 | HR 92 | Temp 97.9°F | Resp 16 | Ht 65.0 in | Wt 181.2 lb

## 2014-09-13 DIAGNOSIS — IMO0002 Reserved for concepts with insufficient information to code with codable children: Secondary | ICD-10-CM

## 2014-09-13 DIAGNOSIS — E1065 Type 1 diabetes mellitus with hyperglycemia: Secondary | ICD-10-CM

## 2014-09-13 DIAGNOSIS — E1165 Type 2 diabetes mellitus with hyperglycemia: Secondary | ICD-10-CM

## 2014-09-13 DIAGNOSIS — E559 Vitamin D deficiency, unspecified: Secondary | ICD-10-CM

## 2014-09-13 DIAGNOSIS — R609 Edema, unspecified: Secondary | ICD-10-CM | POA: Insufficient documentation

## 2014-09-13 MED ORDER — INSULIN PEN NEEDLE 31G X 8 MM MISC: Status: DC

## 2014-09-13 MED ORDER — FUROSEMIDE 20 MG PO TABS: 20.0000 mg | ORAL_TABLET | Freq: Every day | ORAL | Status: DC | PRN

## 2014-09-13 NOTE — Patient Instructions (Signed)
Add a prenatal vitamin once daily. You will be contacted about your 2D echo. Please complete lab work prior to leaving. Schedule fasting physical at the front desk.

## 2014-09-13 NOTE — Progress Notes (Signed)
Subjective:    Patient ID: Abigail Moran, female    DOB: [birth year], 39 y.o.   MRN: [id number]  HPI  Abigail Moran is a 39 yr old female who presents today to establish care. Her Chief complaint is bilateral LE edema. Reports that her edema has been present since January when he had episode of acute nausea and vomiting. She reports edema is improved in the AM and worse in the PM.  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  She denies SOB. Reports some reflux chest pain when she has nausea/vomitting.    Diabetes- diagnosed with DM at Age 22. Reports hx of non-compliance but that she is wanting to become pregnant and is now motivated to take better care of herself. She is working with Endo- Dr. Elvera Lennox, who is helping her with her sugars.  Lab Results  Component Value Date   HGBA1C 12.5* 08/16/2014     Review of Systems  Constitutional: Negative for unexpected weight change.  HENT: Negative for rhinorrhea.   Respiratory: Negative for cough.   Cardiovascular: Positive for leg swelling. Negative for chest pain.  Gastrointestinal: Negative for nausea.  Genitourinary: Negative for dysuria and frequency.  Musculoskeletal: Negative for myalgias and arthralgias.  Skin: Negative for rash.  Neurological: Negative for headaches.  Hematological: Negative for adenopathy.  Psychiatric/Behavioral:       Denies depression/anxiety   Past Medical History  Diagnosis Date  . Diabetes mellitus without complication   . History of kidney stones   . Allergy     History   Social History  . Marital Status: Single    Spouse Name: N/A  . Number of Children: N/A  . Years of Education: N/A   Occupational History  . Not on file.   Social History Main Topics  . Smoking status: Never Smoker   . Smokeless tobacco: Never Used  . Alcohol Use: Yes     Comment: social  . Drug Use: Yes     Comment: occasional marijuana  for nausea  . Sexual Activity: Not on file   Other Topics Concern  . 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 Cholecystectomy  2001    Family History  Problem Relation Age of Onset  . Diabetes Father   . Hypertension Father   . Hyperlipidemia Father   . Cancer Maternal Grandfather     liver and ? colon  . Kidney disease Neg Hx   . Heart disease Neg Hx     Allergies  Allergen Reactions  . Latex     Hives    Current Outpatient Prescriptions on File Prior to Visit  Medication Sig Dispense Refill  . glucose blood (FREESTYLE LITE) test strip Use 3x a day 300 each 11  . Insulin Glargine (LANTUS SOLOSTAR) 100 UNIT/ML Solostar Pen Inject 30 Units into the skin daily at 10 pm. 5 pen 2  . insulin lispro (HUMALOG KWIKPEN) 100 UNIT/ML KiwkPen Inject 0.06-0.15 mLs (6-15 Units total) into the skin 3 (three) times daily. 15 mL 2  . Lancets (FREESTYLE) lancets Use 4x a day 300 each 11   No current facility-administered medications on file prior to visit.    BP 120/90 mmHg  Pulse 92  Temp(Src) 97.9 F (36.6 C) (  Oral)  Resp 16  Ht  (1.651 m)  Wt 181 lb 3.2 oz (82.192 kg)  BMI 30.15 kg/m2  SpO2 99%  LMP 07/29/2014       Objective:   Physical Exam  Constitutional: She is oriented to person, place, and time. She appears well-developed and well-nourished. No distress.  HENT:  Head: Normocephalic and atraumatic.  Right Ear: Tympanic membrane and ear canal normal.  Left Ear: Tympanic membrane normal.  Mouth/Throat: No oropharyngeal exudate or posterior oropharyngeal edema.  Cardiovascular: Normal rate and regular rhythm.   No murmur heard. Pulmonary/Chest: Effort normal and breath sounds normal. No respiratory distress. She has no wheezes. She has no rales. She exhibits no tenderness.  Musculoskeletal:  2+ bilateral LE edema  Lymphadenopathy:    She has no cervical adenopathy.   Neurological: She is alert and oriented to person, place, and time.  Skin: Skin is warm and dry.  Psychiatric: She has a normal mood and affect. Her behavior is normal. Judgment and thought content normal.          Assessment & Plan:  One Touch ultra2 machine given to pt. Lot:  [id number] x

## 2014-09-13 NOTE — Telephone Encounter (Signed)
Please let pt know that for her swelling I have sent rx for lasix one tab by mouth once daily as needed for swelling. Will need to repeat bmet in 2 weeks at her cpx.

## 2014-09-13 NOTE — Assessment & Plan Note (Signed)
Reinforced importance of compliance with diet and meds. We did discuss adding prenatal vitamin since she is trying to conceive.

## 2014-09-13 NOTE — Progress Notes (Signed)
Pre visit review using our clinic review tool, if applicable. No additional management support is needed unless otherwise documented below in the visit note. 

## 2014-09-13 NOTE — Assessment & Plan Note (Signed)
?   Secondary to chronic venous stasis?  Will obtain 2D echo to evaluate cardiac function.  Will add lasix as needed.

## 2014-09-14 ENCOUNTER — Encounter: Payer: Self-pay | Admitting: Family

## 2014-09-14 ENCOUNTER — Telehealth: Payer: Self-pay | Admitting: *Deleted

## 2014-09-14 ENCOUNTER — Telehealth: Payer: Self-pay | Admitting: Family

## 2014-09-14 DIAGNOSIS — E559 Vitamin D deficiency, unspecified: Secondary | ICD-10-CM

## 2014-09-14 HISTORY — DX: Vitamin D deficiency, unspecified: E55.9

## 2014-09-14 LAB — BASIC METABOLIC PANEL
BUN: 13 mg/dL (ref 6–23)
CHLORIDE: 98 meq/L (ref 96–112)
CO2: 28 mEq/L (ref 19–32)
Calcium: 9.2 mg/dL (ref 8.4–10.5)
Creatinine, Ser: 0.88 mg/dL (ref 0.40–1.20)
GFR: 91.56 mL/min (ref >60.00)
Glucose, Bld: 515 mg/dL (ref 70–99)
Potassium: 4.5 mEq/L (ref 3.5–5.1)
Sodium: 130 mEq/L — ABNORMAL LOW (ref 135–145)

## 2014-09-14 LAB — VITAMIN D 25 HYDROXY (VIT D DEFICIENCY, FRACTURES): VITD: 13.5 ng/mL — ABNORMAL LOW (ref 30.00–100.00)

## 2014-09-14 LAB — MICROALBUMIN / CREATININE URINE RATIO
Creatinine,U: 59.3 mg/dL
Microalb Creat Ratio: 1.5 mg/g (ref 0.0–30.0)
Microalb, Ur: 0.9 mg/dL (ref 0.0–1.9)

## 2014-09-14 MED ORDER — VITAMIN D (ERGOCALCIFEROL) 1.25 MG (50000 UNIT) PO CAPS: 50000.0000 [IU] | ORAL_CAPSULE | ORAL | Status: DC

## 2014-09-14 NOTE — Telephone Encounter (Signed)
Left detailed message on cell and to call if any questions. 

## 2014-09-14 NOTE — Telephone Encounter (Signed)
Pt states her blood sugar is currently 489, but she just ate and has taken 10 units of Humalog.  She refuses to go to ER.  Stating she's doing better with taking insulin.  Please advise.

## 2014-09-14 NOTE — Telephone Encounter (Signed)
Also, please let pt know that her vit D is very low. I would like her to start weekly supplement. Repeat level in 12 weeks.

## 2014-09-14 NOTE — Telephone Encounter (Signed)
Notified pt and she voices understanding.  Lab order entered for 12/13/14 at 11am and lab appt scheduled.

## 2014-09-14 NOTE — Telephone Encounter (Signed)
Called patient and left message on voicemail asking patient to please call back quickly.  eal

## 2014-09-14 NOTE — Telephone Encounter (Signed)
Patient CBG is [phone].  (Notified Dawn, FNP student).

## 2014-09-14 NOTE — Telephone Encounter (Signed)
Dr. Ulla Gallo- see discussion below.

## 2014-09-14 NOTE — Telephone Encounter (Signed)
Called and d/w pt >> sugar now down to 311 >> preparing to take another 10 units of NovoLog for dinner. If sugar starting to increase again 2h after dinner >> I advised her to take another 5 units. The reason for her high sugars is that she forgot her Lantus dose.

## 2014-09-14 NOTE — Telephone Encounter (Signed)
Please contact pt and let her know that sugar was critically high yesterday.  Please ask her to check sugar now.  If >400, she should proceed to the ED. Reinforce importance of compliance with insulin.

## 2014-09-15 ENCOUNTER — Ambulatory Visit (HOSPITAL_BASED_OUTPATIENT_CLINIC_OR_DEPARTMENT_OTHER): Payer: 59

## 2014-09-22 ENCOUNTER — Ambulatory Visit (HOSPITAL_BASED_OUTPATIENT_CLINIC_OR_DEPARTMENT_OTHER)
Admission: RE | Admit: 2014-09-22 | Discharge: 2014-09-22 | Disposition: A | Discharge status: Home / Self Care | Payer: 59 | Source: Ambulatory Visit | Attending: Family | Admitting: Family

## 2014-09-22 DIAGNOSIS — R600 Localized edema: Secondary | ICD-10-CM

## 2014-09-22 DIAGNOSIS — R609 Edema, unspecified: Secondary | ICD-10-CM

## 2014-09-22 NOTE — Progress Notes (Signed)
  Echocardiogram 2D Echocardiogram has been performed.  Danil Wedge R 09/22/2014, 11:48 AM 

## 2014-09-22 NOTE — Progress Notes (Signed)
  Echocardiogram 2D Echocardiogram has been performed.  Janalyn Harder 09/22/2014, 11:48 AM

## 2014-09-24 ENCOUNTER — Telehealth: Payer: Self-pay

## 2014-09-24 NOTE — Telephone Encounter (Signed)
See speciality notes 

## 2014-09-27 ENCOUNTER — Encounter: Payer: Self-pay | Admitting: Internal Medicine

## 2014-09-27 ENCOUNTER — Ambulatory Visit (HOSPITAL_BASED_OUTPATIENT_CLINIC_OR_DEPARTMENT_OTHER)
Admission: RE | Admit: 2014-09-27 | Discharge: 2014-09-27 | Disposition: A | Discharge status: Home / Self Care | Payer: 59 | Source: Ambulatory Visit | Attending: Family | Admitting: Family

## 2014-09-27 ENCOUNTER — Ambulatory Visit (INDEPENDENT_AMBULATORY_CARE_PROVIDER_SITE_OTHER): Payer: 59 | Admitting: Internal Medicine

## 2014-09-27 ENCOUNTER — Ambulatory Visit (INDEPENDENT_AMBULATORY_CARE_PROVIDER_SITE_OTHER): Payer: 59 | Admitting: Family

## 2014-09-27 ENCOUNTER — Encounter: Payer: Self-pay | Admitting: Family

## 2014-09-27 VITALS — BP 102/74 | HR 101 | Temp 97.7°F | Ht 65.0 in | Wt 184.6 lb

## 2014-09-27 VITALS — BP 104/70 | HR 90 | Temp 97.9°F | Resp 16 | Ht 65.0 in | Wt 183.0 lb

## 2014-09-27 DIAGNOSIS — Z23 Encounter for immunization: Secondary | ICD-10-CM | POA: Diagnosis not present

## 2014-09-27 DIAGNOSIS — Z1231 Encounter for screening mammogram for malignant neoplasm of breast: Secondary | ICD-10-CM | POA: Insufficient documentation

## 2014-09-27 DIAGNOSIS — R609 Edema, unspecified: Secondary | ICD-10-CM

## 2014-09-27 DIAGNOSIS — Z0000 Encounter for general adult medical examination without abnormal findings: Secondary | ICD-10-CM

## 2014-09-27 DIAGNOSIS — E1065 Type 1 diabetes mellitus with hyperglycemia: Secondary | ICD-10-CM

## 2014-09-27 DIAGNOSIS — IMO0002 Reserved for concepts with insufficient information to code with codable children: Secondary | ICD-10-CM

## 2014-09-27 LAB — CBC WITH DIFFERENTIAL/PLATELET
Basophils Absolute: 0 10*3/uL (ref 0.0–0.1)
Basophils Relative: 0.7 % (ref 0.0–3.0)
Eosinophils Absolute: 0.1 10*3/uL (ref 0.0–0.7)
Eosinophils Relative: 1.6 % (ref 0.0–5.0)
HCT: 29 % — ABNORMAL LOW (ref 36.0–46.0)
Hemoglobin: 9.6 g/dL — ABNORMAL LOW (ref 12.0–15.0)
LYMPHS ABS: 1.2 10*3/uL (ref 0.7–4.0)
LYMPHS PCT: 28.6 % (ref 12.0–46.0)
MCHC: 33 g/dL (ref 30.0–36.0)
MCV: 78 fl (ref 78.0–100.0)
MONOS PCT: 9.8 % (ref 3.0–12.0)
Monocytes Absolute: 0.4 10*3/uL (ref 0.1–1.0)
NEUTROS PCT: 59.3 % (ref 43.0–77.0)
Neutro Abs: 2.6 10*3/uL (ref 1.4–7.7)
Platelets: 330 10*3/uL (ref 150.0–400.0)
RBC: 3.71 Mil/uL — ABNORMAL LOW (ref 3.87–5.11)
RDW: 18.9 % — AB (ref 11.5–15.5)
WBC: 4.4 10*3/uL (ref 4.0–10.5)

## 2014-09-27 LAB — URINALYSIS, ROUTINE W REFLEX MICROSCOPIC
BILIRUBIN URINE: NEGATIVE
Hgb urine dipstick: NEGATIVE
Ketones, ur: NEGATIVE
NITRITE: POSITIVE — AB
RBC / HPF: NONE SEEN (ref 0–?)
Specific Gravity, Urine: 1.025 (ref 1.000–1.030)
Total Protein, Urine: NEGATIVE
URINE GLUCOSE: NEGATIVE
UROBILINOGEN UA: 0.2 (ref 0.0–1.0)
pH: 6 (ref 5.0–8.0)

## 2014-09-27 LAB — HEPATIC FUNCTION PANEL
ALK PHOS: 184 U/L — AB (ref 39–117)
ALT: 17 U/L (ref 0–35)
AST: 25 U/L (ref 0–37)
Albumin: 3.7 g/dL (ref 3.5–5.2)
Bilirubin, Direct: 0.1 mg/dL (ref 0.0–0.3)
TOTAL PROTEIN: 7.7 g/dL (ref 6.0–8.3)
Total Bilirubin: 0.5 mg/dL (ref 0.2–1.2)

## 2014-09-27 LAB — LIPID PANEL
CHOL/HDL RATIO: 3
Cholesterol: 162 mg/dL (ref 0–200)
HDL: 60.9 mg/dL (ref >39.00)
LDL Cholesterol: 91 mg/dL (ref 0–99)
NONHDL: 101.1
Triglycerides: 50 mg/dL (ref 0.0–149.0)
VLDL: 10 mg/dL (ref 0.0–40.0)

## 2014-09-27 LAB — TSH: TSH: 1.57 u[IU]/mL (ref 0.35–4.50)

## 2014-09-27 MED ORDER — INSULIN GLARGINE 100 UNIT/ML SOLOSTAR PEN: 26.0000 [IU] | PEN_INJECTOR | Freq: Every day | SUBCUTANEOUS | Status: DC

## 2014-09-27 MED ORDER — HYDROCHLOROTHIAZIDE 25 MG PO TABS: ORAL_TABLET | ORAL | Status: DC

## 2014-09-27 NOTE — Addendum Note (Signed)
Addended by: Mervin Kung A on: 09/27/2014 09:43 AM   Modules accepted: Orders

## 2014-09-27 NOTE — Patient Instructions (Signed)
Stop lasix, start hctz in place of lasix as needed for swelling. Purchase compression hose and wear during the day. You will be contacted about your mammogram. Follow up in 2 weeks for Blood pressure check and blood work (bmet dx edema).

## 2014-09-27 NOTE — Progress Notes (Signed)
Pre visit review using our clinic review tool, if applicable. No additional management support is needed unless otherwise documented below in the visit note. 

## 2014-09-27 NOTE — Progress Notes (Signed)
Subjective:    Patient ID: Abigail Moran, female    DOB: 1976/06/30, 39 y.o.   MRN: [id number]  HPI  Patient presents today for complete physical.  Immunizations: can't remember last tetanus Diet: eats rice/noodles Exercise: water aerobics 2x a week Scheduled for Pap tomorrow with GYN  Edema- reports LE edema worse as the day wears on- on her feet, hair stylist.    Review of Systems  Constitutional: Negative for unexpected weight change.  HENT: Negative for hearing loss and rhinorrhea.   Eyes:       Has apt in May for eye exam- needs reading glasses  Respiratory: Negative for cough.   Cardiovascular: Negative for palpitations.       Chronic LE edema, worsens with standing  Gastrointestinal: Negative for nausea, abdominal pain, diarrhea and constipation.  Genitourinary: Negative for dysuria, frequency and menstrual problem.  Musculoskeletal: Negative for myalgias and arthralgias.  Skin: Negative for rash.  Neurological: Negative for headaches.       Some numbness right small finger  Hematological: Negative for adenopathy.  Psychiatric/Behavioral: Negative for dysphoric mood and agitation.   Past Medical History  Diagnosis Date  . Diabetes mellitus without complication   . History of kidney stones   . Allergy   . Vitamin D deficiency 09/14/2014    History   Social History  . Marital Status: Single    Spouse Name: N/A  . Number of Children: N/A  . Years of Education: N/A   Occupational History  . Not on file.   Social History Main Topics  . Smoking status: Never Smoker   . Smokeless tobacco: Never Used  . Alcohol Use: Yes     Comment: social  . Drug Use: Yes     Comment: occasional marijuana  for nausea  . Sexual Activity: Not on file   Other Topics Concern  . Not on file   Social History Narrative   Works as Social worker    Lives with common law husband   No children   Hair school   Grew up in [place]       Past Surgical History  Procedure  Laterality Date  . Cholecystectomy  2001    Family History  Problem Relation Age of Onset  . Diabetes Father   . Hypertension Father   . Hyperlipidemia Father   . Cancer Maternal Grandfather     liver and ? colon  . Kidney disease Neg Hx   . Heart disease Neg Hx     Allergies  Allergen Reactions  . Latex     Hives  . Tape     Current Outpatient Prescriptions on File Prior to Visit  Medication Sig Dispense Refill  . furosemide (LASIX) 20 MG tablet Take 1 tablet (20 mg total) by mouth daily as needed. 30 tablet 0  . glucose blood (FREESTYLE LITE) test strip Use 3x a day 300 each 11  . Insulin Glargine (LANTUS SOLOSTAR) 100 UNIT/ML Solostar Pen Inject 30 Units into the skin daily at 10 pm. 5 pen 2  . insulin lispro (HUMALOG KWIKPEN) 100 UNIT/ML KiwkPen Inject 0.06-0.15 mLs (6-15 Units total) into the skin 3 (three) times daily. 15 mL 2  . Insulin Pen Needle 31G X 8 MM MISC Use as directed 100 each 3  . Lancets (FREESTYLE) lancets Use 4x a day 300 each 11  . Vitamin D, Ergocalciferol, (DRISDOL) 50000 UNITS CAPS capsule Take 1 capsule (50,000 Units total) by mouth every 7 (seven) days. 12 capsule  0   No current facility-administered medications on file prior to visit.    BP 104/70 mmHg  Pulse 90  Temp(Src) 97.9 F (36.6 C) (Oral)  Resp 16  Ht  (1.651 m)  Wt 183 lb (83.008 kg)  BMI 30.45 kg/m2  SpO2 99%  LMP 08/28/2014       Objective:   Physical Exam  Physical Exam  Constitutional: She is oriented to person, place, and time. She appears well-developed and well-nourished. No distress.  HENT:  Head: Normocephalic and atraumatic.  Right Ear: Tympanic membrane and ear canal normal.  Left Ear: Tympanic membrane and ear canal normal.  Mouth/Throat: Oropharynx is clear and moist.  Eyes: Pupils are equal, round, and reactive to light. No scleral icterus.  Neck: Normal range of motion. No thyromegaly present.  Cardiovascular: Normal rate and regular rhythm.   No  murmur heard. Pulmonary/Chest: Effort normal and breath sounds normal. No respiratory distress. He has no wheezes. She has no rales. She exhibits no tenderness.  Abdominal: Soft. Bowel sounds are normal. He exhibits no distension and no mass. There is no tenderness. There is no rebound and no guarding.  Musculoskeletal: She exhibits no edema.  Lymphadenopathy:    She has no cervical adenopathy.  Neurological: She is alert and oriented to person, place, and time. She has normal patellar reflexes. She exhibits normal muscle tone. Coordination normal.  Skin: Skin is warm and dry.  Psychiatric: She has a normal mood and affect. Her behavior is normal. Judgment and thought content normal.  Breasts: Examined lying Right: Without masses, retractions, discharge or axillary adenopathy.  Left: Without masses, retractions, discharge or axillary adenopathy.           Assessment & Plan:         Assessment & Plan:

## 2014-09-27 NOTE — Assessment & Plan Note (Signed)
Pt is trying to conceive. She is on lasix which is preg cat C, will change to hctz which is category B. We discussed use of compression hose.

## 2014-09-27 NOTE — Patient Instructions (Signed)
Please decrease Lantus to 26 units in am - Humalog 15 min before meal - small meal: 6 units  - medium meal: 8 units  - large meal: 10 units Please continue the Sliding scale for Humalog insulin: - 150-175: + 1 unit  - 176-200: + 2 units  - 201-225: + 3 units  - 226-250: + 4 units  - > 251: + 5 units  Please bolus 15 min before each meal!  Please do not skip Lantus or Humalog.  Please return in 3 weeks with your log!

## 2014-09-27 NOTE — Progress Notes (Signed)
Patient ID: Abigail Moran, female   DOB: 07/04/1976, 39 y.o.   MRN: [id number]  HPI: Abigail Moran is a 39 y.o.-year-old female, initially referred by ObGyn Dr Mitchel Honour, for management of DM1, dx 1997, stated insulin in 2001, uncontrolled, without complications. She had problems with the insurance in the past >> now [facility] since 09/06/2013. Last visit 1.5 mo ago.  She is thinking about trying to conceive.  Since last visit, she saw PCP for leg swelling >> 2D Echo ordered >> 2D Echo  Last hemoglobin A1c was:  Lab Results  Component Value Date   HGBA1C 12.5* 08/16/2014   HGBA1C 13.0* 05/10/2014  HbA1c 10% in 08/2013.  Pt was on a regimen of: - Lantus 30 units qhs >> 26 units >> 35 units  - she needs to pay 300$/mo only for Lantus - she has been getting samples She ran out of Humalog 1 mo ago  - was taking 10 units ac >> mostly once a day! - small meal: 6 units - medium meal: 8 units  - large meal: 10 units Still taking the mealtime insulin 1-2x a day, rather than  - Sliding scale of NovoLog: - 150-175: + 1 unit  - 176-200: + 2 units  - 201-225: + 3 units  - 226-250: + 4 units  - > 251: + 5 units She was on Metformin >> severe N/V. + coriander + cinnamon + slippery elm  + digestive enzymes  She was on an insulin pump when pregnant >> sugars great but lost the baby at 6 months >> would not want to restart.   Due to price, we switched to:  Insulin Before breakfast Before lunch Before dinner  Regular - small meal: 6 units  - medium meal: 8 units  - large meal: 10 units - small meal: 8 units  - medium meal: 10 units  - large meal: 12-14 units - small meal: 6 units  - medium meal: 8 units  - large meal: 10 units  NPH 15  10   Please continue the Sliding scale for Regular insulin: - 150-175: + 1 unit  - 176-200: + 2 units  - 201-225: + 3 units  - 226-250: + 4 units  - > 251: + 5 units  However, she wanted to go back to lantus and Novolog now that she has a better  insurance plan >> started at last visit: - Lantus 30 units mid-day - may miss it - NovoLog 15 min before meal - takes it sporadically, and takes it after meal.  - small meal: 6 units  - medium meal: 8 units  - large meal: 10 units Please continue the Sliding scale for NovoLog insulin: - 150-175: + 1 unit  - 176-200: + 2 units  - 201-225: + 3 units  - 226-250: + 4 units  - > 251: + 5 units  Pt was checking her sugars 3x a day (not lately) and they are - but no log - brings meter. [address] for last month. - am: 85-150 >> 60-100 >> 60-100 >> 80-100 >> 57 x1, 122 - 2h after b'fast: n/c >> 270 - before lunch: n/c >> 170-180 >> smoothie: 150 >> 300 - 2h after lunch: n/c >> 55 x1, 489 - before dinner: n/c >> 200s >> 311, 505 - 2h after dinner: n/c >> 195, 289, 462, 531 - bedtime: 220-320-HI >> 200-HI (if HI, takes 10 units of NovoLog) >> 270-300s >> 90-140 >> n/c  - nighttime: n/c  No lows. Lowest sugar was 50s; she has hypoglycemia awareness at 60-70.   Meter: OneTouch Ultra 2  Pt's meals are mostly vegetarian.  Exercise: Zumba, African dance  - no CKD: Lab Results  Component Value Date   BUN 13 09/13/2014   CREATININE 0.88 09/13/2014  Last ACR in 09/2013 >> normal. - Lipids: Lab Results  Component Value Date   CHOL 162 09/27/2014   HDL 60.90 09/27/2014   LDLCALC 91 09/27/2014   TRIG 50.0 09/27/2014   CHOLHDL 3 09/27/2014   - last eye exam was 8 years ago. No DR. Now with her new insurance >> will get a new eye exam in 11/2014. - + numbness and tingling in her feet. Cayenne pepper helps.  She had admissions for DKA multiple times as a teenager. 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   I reviewed pt's medications, allergies, PMH, social hx, family hx, and changes were documented in the history of present illness. Otherwise, unchanged from my initial visit note.  ROS: Constitutional: no weight gain/loss, no fatigue, no subjective  hyperthermia/hypothermia Eyes: no blurry vision, no xerophthalmia ENT: no sore throat, no nodules palpated in throat, no dysphagia/odynophagia, no hoarseness Cardiovascular: no CP/SOB/palpitations/+ B leg swelling Respiratory: no cough/no SOB Gastrointestinal: no N/V/D/C Musculoskeletal: no muscle/joint aches Skin: no rashes Neurological: no tremors/numbness/tingling/dizziness  PE: BP 102/74 mmHg  Pulse 101  Temp(Src) 97.7 F (36.5 C) (Oral)  Ht 5\' 5"  (1.651 m)  Wt 184 lb 9.6 oz (83.734 kg)  BMI 30.72 kg/m2  LMP 08/29/2014 Wt Readings from Last 3 Encounters:  09/27/14 184 lb 9.6 oz (83.734 kg)  09/27/14 183 lb (83.008 kg)  09/13/14 181 lb 3.2 oz (82.192 kg)   Constitutional: overweight, in NAD Eyes: PERRLA, EOMI, no exophthalmos ENT: moist mucous membranes, no thyromegaly, no cervical lymphadenopathy Cardiovascular: RRR, No MRG, + pitting B LE edema Respiratory: CTA B Gastrointestinal: abdomen soft, NT, ND, BS+ Musculoskeletal: no deformities, strength intact in all 4 Skin: moist, warm, no rashes Neurological: no tremor with outstretched hands, DTR normal in all 4  ASSESSMENT: 1. DM1, insulin-dependent, uncontrolled, without complications - refuses a pump  Received labs from ObGyn 08/03/2013: - HbA1c: 12.2% - Hb 9.6/HT 30.6, MCV 81 (78-100) - iron 47 (42-145), TIBC 378 (250-470) - CMP with Glu 263, A phos 178 (39-117), BUN/Cr 16/0.98, OTW normal - TSH 1.059  PLAN:  1. Patient with long-standing, uncontrolled diabetes, on basal-bolus insulin, but noncompliant with her insulin doses and sugar checks. She mostly relies on the basal insulin and does not bolus frequently. Even the Lantus she may forget... She has lows in am and get's very high throughout the day as a consequence of not enough mealtime insulin. She is also injecting after a meal, if she is high!. We discuss again about proper mealtime insulin dosing 15 min before a meal, and I again underlined compliance.  Given a new log. Will also decrease Lantus. - I suggested to: Patient Instructions  Please decrease Lantus to 26 units in am - Humalog 15 min before meal - small meal: 6 units  - medium meal: 8 units  - large meal: 10 units Please continue the Sliding scale for Humalog insulin: - 150-175: + 1 unit  - 176-200: + 2 units  - 201-225: + 3 units  - 226-250: + 4 units  - > 251: + 5 units  Please bolus 15 min before each meal!  Please do not skip Lantus  or Humalog.  Please return in 3 weeks with your log!  - Strongly advised her to start checking sugars at different times of the day - check 3-4 times a day, rotating checks - advised for yearly eye exams >> needs one!  - she knows that HbA1c needs to be normal to conceive.  - Please return in 3 weeks with your sugar log.

## 2014-09-27 NOTE — Assessment & Plan Note (Signed)
Discussed diabetic diet, exercise, obtain routine lab work refer for mammo- has pap scheduled.

## 2014-09-28 ENCOUNTER — Encounter: Payer: Self-pay | Admitting: Internal Medicine

## 2014-09-28 ENCOUNTER — Telehealth: Payer: Self-pay | Admitting: Family

## 2014-09-28 NOTE — Telephone Encounter (Signed)
Please ask lab to add on serum iron, ferritin, TIBC. Dx anemia.

## 2014-10-01 ENCOUNTER — Telehealth: Payer: Self-pay | Admitting: Family

## 2014-10-01 MED ORDER — NITROFURANTOIN MACROCRYSTAL 100 MG PO CAPS: 100.0000 mg | ORAL_CAPSULE | Freq: Four times a day (QID) | ORAL | Status: DC

## 2014-10-01 NOTE — Telephone Encounter (Signed)
Please contact pt and let her know that she is very Anemic.  I would recommend that she add iron 321m twice daily. Also, complete ifob dx anemia (I asked lab to add on iron studies to labs that were drawn) I would also recommend that she avoid becoming pregnant until her blood count is improved and her sugars are under better control.  Her alk phos is elevated as well.  Next visit we should check a vit D level.

## 2014-10-01 NOTE — Telephone Encounter (Signed)
Also, please ask pt to add macrodantin for UTI.

## 2014-10-04 NOTE — Telephone Encounter (Signed)
Notified patient of lab results.  Patient stated understanding of results and medication changes.  Instructed patient on how to pick up and obtain IFOB.  Patient stated understanding and said she will pick up kit tomorrow.

## 2014-10-05 NOTE — Telephone Encounter (Signed)
LMOVM to return to lab

## 2014-10-06 ENCOUNTER — Other Ambulatory Visit (INDEPENDENT_AMBULATORY_CARE_PROVIDER_SITE_OTHER): Payer: 59

## 2014-10-06 DIAGNOSIS — D649 Anemia, unspecified: Secondary | ICD-10-CM | POA: Diagnosis not present

## 2014-10-06 LAB — IRON AND TIBC
%SAT: 7 % — ABNORMAL LOW (ref 20–55)
Iron: 28 ug/dL — ABNORMAL LOW (ref 42–145)
TIBC: 399 ug/dL (ref 250–470)
UIBC: 371 ug/dL (ref 125–400)

## 2014-10-06 LAB — FERRITIN: FERRITIN: 16 ng/mL (ref 10–291)

## 2014-10-11 ENCOUNTER — Ambulatory Visit (INDEPENDENT_AMBULATORY_CARE_PROVIDER_SITE_OTHER): Payer: 59 | Admitting: Family

## 2014-10-11 ENCOUNTER — Encounter: Payer: Self-pay | Admitting: Family

## 2014-10-11 VITALS — BP 118/72 | HR 87 | Temp 98.0°F | Resp 16 | Ht 65.0 in | Wt 184.2 lb

## 2014-10-11 DIAGNOSIS — D649 Anemia, unspecified: Secondary | ICD-10-CM

## 2014-10-11 DIAGNOSIS — R609 Edema, unspecified: Secondary | ICD-10-CM | POA: Diagnosis not present

## 2014-10-11 MED ORDER — HYDROCHLOROTHIAZIDE 25 MG PO TABS: ORAL_TABLET | ORAL | Status: DC

## 2014-10-11 NOTE — Progress Notes (Signed)
Subjective:    Patient ID: Abigail Moran, female    DOB: [birth year], 39 y.o.   MRN: [id number]  HPI  Abigail Moran is a 39 yr old female who presents today for follow up.   Edema- Last visit she was placed on hctz  for LE edema.  Reports that her LE edema has improved considerably, has worked sever 12 hour shifts in a row recently and has some LE edema.    Anemia- Reports mild constipation on iron. Reports periods can be heavy.  Was 7 days but now 4, sometimes she passes blood clots.    Review of Systems See HPI  Past Medical History  Diagnosis Date  . Diabetes mellitus without complication   . History of kidney stones   . Allergy   . Vitamin D deficiency 09/14/2014    History   Social History  . Marital Status: Single    Spouse Name: N/A  . Number of Children: N/A  . Years of Education: N/A   Occupational History  . Not on file.   Social History Main Topics  . Smoking status: Never Smoker   . Smokeless tobacco: Never Used  . Alcohol Use: Yes     Comment: social  . Drug Use: Yes     Comment: occasional marijuana  for nausea  . Sexual Activity: Not on file   Other Topics Concern  . Not on file   Social History Narrative   Works as Social worker    Lives with common law husband   No children   Hair school   Grew up in [place]       Past Surgical History  Procedure Laterality Date  . Cholecystectomy  2001    Family History  Problem Relation Age of Onset  . Diabetes Father   . Hypertension Father   . Hyperlipidemia Father   . Cancer Maternal Grandfather     liver and ? colon  . Kidney disease Neg Hx   . Heart disease Neg Hx     Allergies  Allergen Reactions  . Latex     itching  . Tape     hives    Current Outpatient Prescriptions on File Prior to Visit  Medication Sig Dispense Refill  . glucose blood (FREESTYLE LITE) test strip Use 3x a day 300 each 11  . hydrochlorothiazide (HYDRODIURIL) 25 MG tablet One tab by mouth once daily as  needed for swelling 30 tablet 2  . Insulin Glargine (LANTUS SOLOSTAR) 100 UNIT/ML Solostar Pen Inject 26 Units into the skin daily at 10 pm. 5 pen 2  . insulin lispro (HUMALOG KWIKPEN) 100 UNIT/ML KiwkPen Inject 0.06-0.15 mLs (6-15 Units total) into the skin 3 (three) times daily. 15 mL 2  . Insulin Pen Needle 31G X 8 MM MISC Use as directed 100 each 3  . Lancets (FREESTYLE) lancets Use 4x a day 300 each 11  . Vitamin D, Ergocalciferol, (DRISDOL) 50000 UNITS CAPS capsule Take 1 capsule (50,000 Units total) by mouth every 7 (seven) days. 12 capsule 0   No current facility-administered medications on file prior to visit.    BP 118/72 mmHg  Pulse 87  Temp(Src) 98 F (36.7 C) (Oral)  Resp 16  Ht  (1.651 m)  Wt 184 lb 3.2 oz (83.553 kg)  BMI 30.65 kg/m2  SpO2 99%  LMP 09/28/2014       Objective:   Physical Exam  Constitutional: She appears well-developed and well-nourished. No distress.  Cardiovascular: Normal  rate and regular rhythm.   No murmur heard. Pulmonary/Chest: Effort normal and breath sounds normal. No respiratory distress. She has no wheezes. She has no rales. She exhibits no tenderness.  Musculoskeletal:  1-2+ bilateral LE edema          Assessment & Plan:

## 2014-10-11 NOTE — Patient Instructions (Signed)
Please complete lab work prior to leaving. Continue iron supplement, return stool kit at your earliest convenience. Follow up in 3 months.

## 2014-10-11 NOTE — Assessment & Plan Note (Signed)
Improved on hctz once daily.  Continue same, obtain bmet.

## 2014-10-11 NOTE — Progress Notes (Signed)
Pre visit review using our clinic review tool, if applicable. No additional management support is needed unless otherwise documented below in the visit note. 

## 2014-10-11 NOTE — Assessment & Plan Note (Signed)
Continue iron, pt advised to complete ifob and return. Plan repeat cbc in 3 months.

## 2014-10-12 ENCOUNTER — Encounter: Payer: Self-pay | Admitting: Family

## 2014-10-12 LAB — BASIC METABOLIC PANEL
BUN: 16 mg/dL (ref 6–23)
CO2: 24 mEq/L (ref 19–32)
Calcium: 9 mg/dL (ref 8.4–10.5)
Chloride: 104 mEq/L (ref 96–112)
Creatinine, Ser: 0.89 mg/dL (ref 0.40–1.20)
GFR: 90.34 mL/min (ref >60.00)
Glucose, Bld: 118 mg/dL — ABNORMAL HIGH (ref 70–99)
Potassium: 4 mEq/L (ref 3.5–5.1)
Sodium: 134 mEq/L — ABNORMAL LOW (ref 135–145)

## 2014-10-20 ENCOUNTER — Encounter: Payer: Self-pay | Admitting: Internal Medicine

## 2014-10-20 ENCOUNTER — Ambulatory Visit (INDEPENDENT_AMBULATORY_CARE_PROVIDER_SITE_OTHER): Payer: 59 | Admitting: Internal Medicine

## 2014-10-20 VITALS — BP 108/68 | HR 98 | Temp 98.2°F | Resp 12 | Wt 180.0 lb

## 2014-10-20 DIAGNOSIS — E1065 Type 1 diabetes mellitus with hyperglycemia: Secondary | ICD-10-CM

## 2014-10-20 DIAGNOSIS — IMO0002 Reserved for concepts with insufficient information to code with codable children: Secondary | ICD-10-CM

## 2014-10-20 MED ORDER — INSULIN GLARGINE 100 UNIT/ML SOLOSTAR PEN
24.0000 [IU] | PEN_INJECTOR | Freq: Every day | SUBCUTANEOUS | Status: DC
Start: 2014-10-20 — End: 2015-04-11

## 2014-10-20 NOTE — Progress Notes (Signed)
Patient ID: Abigail Moran, female   DOB: February 27, 1976, 39 y.o.   MRN: [id number]  HPI: Abigail Moran is a 39 y.o.-year-old female, initially referred by ObGyn Dr Mitchel Honour, for management of DM1, dx 1997, stated insulin in 2001, uncontrolled, without complications. She had problems with the insurance in the past >> now [facility] since 09/06/2013. Last visit 1.5 mo ago.  Last hemoglobin A1c was:  Lab Results  Component Value Date   HGBA1C 12.5* 08/16/2014   HGBA1C 13.0* 05/10/2014  HbA1c 10% in 08/2013.  She was on an insulin pump when pregnant >> sugars great but lost the baby at 6 months >> would not want to restart.   She is now on: - Lantus 30 >> 26 units mid-day >> hs - rarely miss it - NovoLog 15 min before meal - forgets many doses >> sugars increase from 70s to 400s if she skips the dose with 1 meal) - small meal: 6 units  - medium meal: 8 units  - large meal: 10 units Please continue the Sliding scale for NovoLog insulin: - 150-175: + 1 unit  - 176-200: + 2 units  - 201-225: + 3 units  - 226-250: + 4 units  - > 251: + 5 units She was on Metformin >> severe N/V. + coriander + cinnamon + slippery elm  + digestive enzymes  Pt was checking her sugars 3x a day (not lately) and they are close to target when she takes her insulin but very high if she forgets - reviewed log: - am: 85-150 >> 60-100 >> 60-100 >> 80-100 >> 57 x1, 122 >> 40 x1 (after overcorrection of bedtime high, 353, with 6 units!) 75-130, 263 - 2h after b'fast: n/c >> 270 >> n/c - before lunch: n/c >> 170-180 >> smoothie: 150 >> 300 >> 76-189, 452 (if no Humalog) - 2h after lunch: n/c >> 55 x1, 489 >> 292, 416 - before dinner: n/c >> 200s >> 311, 505 >> 67, 300s and 547 (forgets insulin) - 2h after dinner: n/c >> 195, 289, 462, 531 >> 193-353 - bedtime: 220-320-HI >> 200-HI (if HI, takes 10 units of NovoLog) >> 270-300s >> 90-140 >> n/c  - nighttime: n/c No lows. Lowest sugar was 50s >> 40x1 no (overcorrection); she  has hypoglycemia awareness at 60-70.   Meter: OneTouch Ultra 2  Pt's meals are mostly vegetarian.  Exercise: Zumba, African dance  - no CKD: Lab Results  Component Value Date   BUN 16 10/11/2014   CREATININE 0.89 10/11/2014  Last ACR in 09/2013 >> normal. - Lipids: Lab Results  Component Value Date   CHOL 162 09/27/2014   HDL 60.90 09/27/2014   LDLCALC 91 09/27/2014   TRIG 50.0 09/27/2014   CHOLHDL 3 09/27/2014   - last eye exam was 8 years ago. No DR. Now with her new insurance >> will get a new eye exam in 11/2014. - + numbness and tingling in her feet. Cayenne pepper helps.  She had admissions for DKA multiple times as a teenager. 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   I reviewed pt's medications, allergies, PMH, social hx, family hx, and changes were documented in the history of present illness. Otherwise, unchanged from my initial visit note.  ROS: Constitutional: no weight gain/loss, no fatigue, no subjective hyperthermia/hypothermia Eyes: no blurry vision, no xerophthalmia ENT: no sore throat, no nodules palpated in throat, no dysphagia/odynophagia, no hoarseness Cardiovascular: no CP/SOB/palpitations/+ B leg  swelling >> improved Respiratory: no cough/no SOB Gastrointestinal: no N/V/D/C Musculoskeletal: no muscle/joint aches Skin: no rashes Neurological: no tremors/numbness/tingling/dizziness  PE: BP 108/68 mmHg  Pulse 98  Temp(Src) 98.2 F (36.8 C) (Oral)  Resp 12  Wt 180 lb (81.647 kg)  SpO2 94%  LMP 09/28/2014 Wt Readings from Last 3 Encounters:  10/20/14 180 lb (81.647 kg)  10/11/14 184 lb 3.2 oz (83.553 kg)  09/27/14 184 lb 9.6 oz (83.734 kg)   Constitutional: overweight, in NAD Eyes: PERRLA, EOMI, no exophthalmos ENT: moist mucous membranes, no thyromegaly, no cervical lymphadenopathy Cardiovascular: RRR, No MRG, + pitting B LE edema Respiratory: CTA B Gastrointestinal: abdomen soft, NT, ND,  BS+ Musculoskeletal: no deformities, strength intact in all 4 Skin: moist, warm, no rashes Neurological: no tremor with outstretched hands, DTR normal in all 4  ASSESSMENT: 1. DM1, insulin-dependent, uncontrolled, without complications - refuses a pump  Received labs from ObGyn 08/03/2013: - HbA1c: 12.2% - Hb 9.6/HT 30.6, MCV 81 (78-100) - iron 47 (42-145), TIBC 378 (250-470) - CMP with Glu 263, A phos 178 (39-117), BUN/Cr 16/0.98, OTW normal - TSH 1.059  PLAN:  1. Patient with long-standing, uncontrolled diabetes, on basal-bolus insulin, but noncompliant with her insulin doses and sugar checks. She mostly relies on the basal insulin and does not bolus frequently. We again discussed about the need or compliance with boluses and checks. Will also decrease Lantus since she has lows b/w meals. She can bolus at bedtime if sugars >200 >> low sugars in am >> advised to only bolus if >300 and only few units - see below: - I suggested to: Patient Instructions  Please decrease Lantus to 24 units in am Do not use Humalog correction at bedtime unless your sugars are 300 or higher, and then only use 3 units >> try to wake up once at night to check the sugars in that case. - Humalog: - small meal: 6 units  - medium meal: 8 units  - large meal: 10 units - Sliding scale for Humalog insulin: - 150-175: + 1 unit  - 176-200: + 2 units  - 201-225: + 3 units  - 226-250: + 4 units  - > 251: + 5 units  Please bolus 15 min before each meal!  Please return in 1.5 months with your log.  - Strongly advised her to start checking sugars and bolusing at least 3-4 times a day, rotating checks - advised for yearly eye exams >> needs one!!!! - Please return in 1.5 with your sugar log.

## 2014-10-20 NOTE — Patient Instructions (Signed)
Please decrease Lantus to 24 units in am Do not use Humalog correction at bedtime unless your sugars are 300 or higher, and then only use 3 units >> try to wake up once at night to check the sugars in that case. - Humalog: - small meal: 6 units  - medium meal: 8 units  - large meal: 10 units - Sliding scale for Humalog insulin: - 150-175: + 1 unit  - 176-200: + 2 units  - 201-225: + 3 units  - 226-250: + 4 units  - > 251: + 5 units  Please bolus 15 min before each meal!  Please return in 1.5 months with your log.

## 2014-11-29 ENCOUNTER — Ambulatory Visit (INDEPENDENT_AMBULATORY_CARE_PROVIDER_SITE_OTHER): Payer: 59 | Admitting: Internal Medicine

## 2014-11-29 ENCOUNTER — Encounter: Payer: Self-pay | Admitting: Internal Medicine

## 2014-11-29 VITALS — BP 114/68 | HR 93 | Temp 98.5°F | Resp 12 | Wt 181.8 lb

## 2014-11-29 DIAGNOSIS — E1065 Type 1 diabetes mellitus with hyperglycemia: Secondary | ICD-10-CM | POA: Diagnosis not present

## 2014-11-29 DIAGNOSIS — IMO0002 Reserved for concepts with insufficient information to code with codable children: Secondary | ICD-10-CM

## 2014-11-29 LAB — HEMOGLOBIN A1C: HEMOGLOBIN A1C: 10.5 % — AB (ref 4.6–6.5)

## 2014-11-29 NOTE — Progress Notes (Signed)
Patient ID: Abigail Moran, female   DOB: January 18, 1976, 39 y.o.   MRN: [id number]  HPI: Abigail Moran is a 39 y.o.-year-old female, initially referred by ObGyn Dr Mitchel Honour, for management of DM1, dx 1997, stated insulin in 2001, uncontrolled, with complications (DR). She had problems with the insurance in the past >> now [facility] since 09/06/2013. Last visit 1.5 mo ago.  Last hemoglobin A1c was:  Lab Results  Component Value Date   HGBA1C 12.5* 08/16/2014   HGBA1C 13.0* 05/10/2014  HbA1c 10% in 08/2013.  She was on an insulin pump when pregnant >> sugars great but lost the baby at 6 months >> would not want to restart.   She is now on: - Lantus to 24 units in am Do not use Humalog correction at bedtime unless your sugars are 300 or higher, and then only use 3 units >> try to wake up once at night to check the sugars in that case. - Humalog: - small meal: 6 units  - medium meal: 8 units  - large meal: 10 units - Sliding scale for Humalog insulin: - 150-175: + 1 unit  - 176-200: + 2 units  - 201-225: + 3 units  - 226-250: + 4 units     - > 251: + 5 units She was on Metformin >> severe N/V. + coriander + cinnamon + slippery elm  + digestive enzymes  Pt was checking her sugars 3x a day (not lately) and they are close to target when she takes her insulin but very high if she forgets - reviewed log: - am: 80-100 >> 57 x1, 122 >> 40 x1 (after overcorrection of bedtime high, 353, with 6 units!) 75-130, 263 >> 59-207 - 2h after b'fast: n/c >> 270 >> n/c - before lunch: n/c >> 170-180 >> smoothie: 150 >> 300 >> 76-189, 452 (if no Humalog) >> 102-160, 400s - 2h after lunch: n/c >> 55 x1, 489 >> 292, 416 >> n/c - before dinner: n/c >> 200s >> 311, 505 >> 67, 300s and 547 (forgets insulin) >> 100-200, 400 - 2h after dinner: n/c >> 195, 289, 462, 531 >> 193-353 - bedtime: 220-320-HI >> 200-HI (if HI, takes 10 units of NovoLog) >> 270-300s >> 90-140 >> n/c >> 112-286 - nighttime: n/c No lows.  Lowest sugar was 50s >> 40x1 no (overcorrection) >> 59; she has hypoglycemia awareness at 60-70.   Meter: OneTouch Ultra 2  Pt's meals are mostly vegetarian.  Exercise: Zumba, African dance  - no CKD: Lab Results  Component Value Date   BUN 16 10/11/2014   CREATININE 0.89 10/11/2014  Last ACR in 09/2013 >> normal. - Lipids: Lab Results  Component Value Date   CHOL 162 09/27/2014   HDL 60.90 09/27/2014   LDLCALC 91 09/27/2014   TRIG 50.0 09/27/2014   CHOLHDL 3 09/27/2014   - last eye exam was 11/2014. + DR. Next appt 6 mo. - + numbness and tingling in her feet. Cayenne pepper helps.  She had admissions for DKA multiple times as a teenager. 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   I reviewed pt's medications, allergies, PMH, social hx, family hx, and changes were documented in the history of present illness. Otherwise, unchanged from my initial visit note.  ROS: Constitutional: no weight gain/loss, no fatigue, no subjective hyperthermia/hypothermia Eyes: no blurry vision, no xerophthalmia ENT: no sore throat, no nodules palpated in throat, no dysphagia/odynophagia, no hoarseness Cardiovascular: no  CP/SOB/palpitations/+ B leg swelling  Respiratory: no cough/no SOB Gastrointestinal: + N/+ V/no D/C Musculoskeletal: no muscle/joint aches Skin: no rashes Neurological: no tremors/numbness/tingling/dizziness  PE: BP 114/68 mmHg  Pulse 93  Temp(Src) 98.5 F (36.9 C) (Oral)  Resp 12  Wt 181 lb 12.8 oz (82.464 kg)  SpO2 98% Body mass index is 30.25 kg/(m^2). Wt Readings from Last 3 Encounters:  11/29/14 181 lb 12.8 oz (82.464 kg)  10/20/14 180 lb (81.647 kg)  10/11/14 184 lb 3.2 oz (83.553 kg)   Constitutional: overweight, in NAD Eyes: PERRLA, EOMI, no exophthalmos ENT: moist mucous membranes, no thyromegaly, no cervical lymphadenopathy Cardiovascular: RRR, No MRG, + pitting B LE edema Respiratory: CTA B Gastrointestinal:  abdomen soft, NT, ND, BS+ Musculoskeletal: no deformities, strength intact in all 4 Skin: moist, warm, no rashes Neurological: no tremor with outstretched hands, DTR normal in all 4  ASSESSMENT: 1. DM1, insulin-dependent, uncontrolled, without complications - refuses a pump - DR  Received labs from [facility] 08/03/2013: - HbA1c: 12.2% - Hb 9.6/HT 30.6, MCV 81 (78-100) - iron 47 (42-145), TIBC 378 (250-470) - CMP with Glu 263, A phos 178 (39-117), BUN/Cr 16/0.98, OTW normal - TSH 1.059  PLAN:  1. Patient with long-standing, uncontrolled diabetes, on basal-bolus insulin, a little more compliant with her insulin doses and sugar checks after she started to set alarms for meals. She still gets to 300-400 if she forgets a bolus, but average of her sugars is better: 190's. Again advised her to not correct bedtime sugars up to 300s. - I suggested to stay on the same regimen and continue to work on compliance: Patient Instructions  Please continue Lantus to 24 units in am  Do not use Humalog correction at bedtime unless your sugars are 300 or higher, and then only use 3 units >> try to wake up once at night to check the sugars in that case.  - Humalog: - small meal: 6 units  - medium meal: 8 units  - large meal: 10 units  - Sliding scale for Humalog insulin: - 150-175: + 1 unit  - 176-200: + 2 units  - 201-225: + 3 units  - 226-250: + 4 units  - > 251: + 5 units  Please bolus 15 min before each meal!  Please stop at the lab.  Please return in 3 months with your log.  - Strongly advised her to start checking sugars and bolusing at least 3-4 times a day, rotating checks - advised for yearly eye exams >> she is UTD - check HbA1c - RTC in 3 mo.  Office Visit on 11/29/2014  Component Date Value Ref Range Status  . Hgb A1c MFr Bld 11/29/2014 10.5* 4.6 - 6.5 % Final   Glycemic Control Guidelines for People with Diabetes:Non Diabetic:  <6%Goal of Therapy: <7%Additional Action Suggested:   >8%   HbA1c is improved by 2%.

## 2014-11-29 NOTE — Patient Instructions (Signed)
Please continue Lantus to 24 units in am  Do not use Humalog correction at bedtime unless your sugars are 300 or higher, and then only use 3 units >> try to wake up once at night to check the sugars in that case.  - Humalog: - small meal: 6 units  - medium meal: 8 units  - large meal: 10 units  - Sliding scale for Humalog insulin: - 150-175: + 1 unit  - 176-200: + 2 units  - 201-225: + 3 units  - 226-250: + 4 units  - > 251: + 5 units  Please bolus 15 min before each meal!  Please stop at the lab.  Please return in 3 months with your log.

## 2014-12-13 ENCOUNTER — Other Ambulatory Visit (INDEPENDENT_AMBULATORY_CARE_PROVIDER_SITE_OTHER): Payer: Self-pay

## 2014-12-13 DIAGNOSIS — E559 Vitamin D deficiency, unspecified: Secondary | ICD-10-CM

## 2014-12-13 DIAGNOSIS — R609 Edema, unspecified: Secondary | ICD-10-CM

## 2014-12-13 LAB — BASIC METABOLIC PANEL
BUN: 17 mg/dL (ref 6–23)
CALCIUM: 8.9 mg/dL (ref 8.4–10.5)
CO2: 25 mEq/L (ref 19–32)
CREATININE: 0.89 mg/dL (ref 0.40–1.20)
Chloride: 104 mEq/L (ref 96–112)
GFR: 90.27 mL/min (ref >60.00)
GLUCOSE: 62 mg/dL — AB (ref 70–99)
POTASSIUM: 3.8 meq/L (ref 3.5–5.1)
Sodium: 136 mEq/L (ref 135–145)

## 2014-12-13 LAB — VITAMIN D 25 HYDROXY (VIT D DEFICIENCY, FRACTURES): VITD: 18.99 ng/mL — AB (ref 30.00–100.00)

## 2014-12-16 ENCOUNTER — Telehealth: Payer: Self-pay | Admitting: Family

## 2014-12-16 DIAGNOSIS — E559 Vitamin D deficiency, unspecified: Secondary | ICD-10-CM

## 2014-12-16 NOTE — Telephone Encounter (Signed)
Vit D is still low.  Is she taking the weekly vit D supplement? If not, I would recommend she start.  If she is taking I would rec that she continue for another 12 weeks repeat vit D in 12 weeks, dx vit d deficiency.

## 2014-12-17 MED ORDER — VITAMIN D (ERGOCALCIFEROL) 1.25 MG (50000 UNIT) PO CAPS: 50000.0000 [IU] | ORAL_CAPSULE | ORAL | Status: DC

## 2014-12-17 NOTE — Telephone Encounter (Signed)
Left detailed message on cell# to check mychart message that I sent [id number].

## 2015-01-03 ENCOUNTER — Ambulatory Visit: Payer: Self-pay | Admitting: Family

## 2015-01-03 DIAGNOSIS — Z0289 Encounter for other administrative examinations: Secondary | ICD-10-CM

## 2015-01-04 ENCOUNTER — Telehealth: Payer: Self-pay | Admitting: Family

## 2015-01-04 ENCOUNTER — Encounter: Payer: Self-pay | Admitting: Family

## 2015-01-04 NOTE — Telephone Encounter (Signed)
Yes please

## 2015-01-04 NOTE — Telephone Encounter (Signed)
Pt was no show 01/03/15 10:45am, follow up 15, pt has not rescheduled, mailing letter, charge?

## 2015-02-21 ENCOUNTER — Ambulatory Visit: Payer: 59 | Admitting: Internal Medicine

## 2015-04-11 ENCOUNTER — Encounter: Payer: Self-pay | Admitting: Internal Medicine

## 2015-04-11 ENCOUNTER — Ambulatory Visit (INDEPENDENT_AMBULATORY_CARE_PROVIDER_SITE_OTHER): Payer: 59 | Admitting: Internal Medicine

## 2015-04-11 VITALS — BP 126/82 | HR 102 | Temp 98.5°F | Ht 65.5 in | Wt 161.0 lb

## 2015-04-11 DIAGNOSIS — E10649 Type 1 diabetes mellitus with hypoglycemia without coma: Secondary | ICD-10-CM | POA: Diagnosis not present

## 2015-04-11 LAB — POCT GLYCOSYLATED HEMOGLOBIN (HGB A1C): HEMOGLOBIN A1C: 8.5

## 2015-04-11 MED ORDER — INSULIN GLARGINE 100 UNIT/ML SOLOSTAR PEN: 20.0000 [IU] | PEN_INJECTOR | Freq: Every day | SUBCUTANEOUS | Status: DC

## 2015-04-11 NOTE — Progress Notes (Signed)
Patient ID: Abigail Moran, female   DOB: January 27, 1976, 39 y.o.   MRN: [id number]  HPI: Abigail Moran is a 39 y.o.-year-old female, initially referred by ObGyn Dr Mitchel Honour, for management of DM1, dx 1997, stated insulin in 2001, uncontrolled, with complications (DR). She had problems with the insurance in the past >> now [facility] since 09/06/2013. Last visit 4.5 mo ago.  She lost 20 lbs since last visit.   She had pancreatitis in the past >> feels she has this again >> she cannot eat meat. She manages this by herself, plans to see PCP for this.  Last hemoglobin A1c was:  Lab Results  Component Value Date   HGBA1C 10.5* 11/29/2014   HGBA1C 12.5* 08/16/2014   HGBA1C 13.0* 05/10/2014  HbA1c 10% in 08/2013.  She was on an insulin pump when pregnant >> sugars great but lost the baby at 6 months >> would not want to restart.   She is now on: - Lantus 20-22 units in am Do not use Humalog correction at bedtime unless your sugars are 300 or higher, and then only use 3 units >> try to wake up once at night to check the sugars in that case. - Humalog: 0-6 units - Sliding scale for Humalog insulin: - 150-175: + 1 unit  - 176-200: + 2 units  - 201-225: + 3 units  - 226-250: + 4 units  - > 251: + 5 units  She was on Metformin >> severe N/V. + coriander + cinnamon + slippery elm  + digestive enzymes  Pt was checking her sugars 3x a day (not lately) reviewed downloaded meter report - am:40 x1 (after overcorrection of bedtime high, 353, with 6 units!) 75-130, 263 >> 59-207 >> 52 x1 (on Lantus 24 units), 284-447 - 2h after b'fast: n/c >> 270 >> n/c >> 111, 155 - before lunch: 170-180 >> smoothie: 150 >> 300 >> 76-189, 452 (if no Humalog) >> 102-160, 400s >> 263, 316 - 2h after lunch: n/c >> 55 x1, 489 >> 292, 416 >> n/c >> 107-168, 400 - before dinner: n/c >> 200s >> 311, 505 >> 67, 300s and 547 (forgets insulin) >> 100-200, 400 >> 50x1, 95-152, 257 - 2h after dinner: n/c >> 195, 289, 462, 531 >>  193-353 >> 145-422 - bedtime: 200-HI (if HI, takes 10 units of NovoLog) >> 270-300s >> 90-140 >> n/c >> 112-286 >> see above  - nighttime: n/c >> 182-447 No lows. Lowest sugar was 50s >> 40x1 no (overcorrection) >> 59 >> 40s; she has hypoglycemia awareness at 60-70.   Meter: OneTouch Ultra 2  Pt's meals are mostly vegetarian.  Exercise: Zumba, African dance  - no CKD: Lab Results  Component Value Date   BUN 17 12/13/2014   CREATININE 0.89 12/13/2014  Last ACR in 09/2014 >> normal. - Lipids: Lab Results  Component Value Date   CHOL 162 09/27/2014   HDL 60.90 09/27/2014   LDLCALC 91 09/27/2014   TRIG 50.0 09/27/2014   CHOLHDL 3 09/27/2014   - last eye exam was 11/2014. + DR.  - + numbness and tingling in her feet. Cayenne pepper helps.  She had admissions for DKA multiple times as a teenager. 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   I reviewed pt's medications, allergies, PMH, social hx, family hx, and changes were documented in the history of present illness. Otherwise, unchanged from my initial visit note.  ROS: Constitutional: + weight loss,  no fatigue, no subjective hyperthermia/hypothermia Eyes: no blurry vision, no xerophthalmia ENT: no sore throat, no nodules palpated in throat, no dysphagia/odynophagia, no hoarseness Cardiovascular: no CP/SOB/palpitations/B leg swelling  Respiratory: no cough/no SOB Gastrointestinal: + N/no V/no D/C Musculoskeletal: no muscle/joint aches Skin: no rashes Neurological: no tremors/numbness/tingling/dizziness  PE: BP 126/82 mmHg  Pulse 102  Temp(Src) 98.5 F (36.9 C) (Oral)  Ht 5' 5.5" (1.664 m)  Wt 161 lb (73.029 kg)  BMI 26.37 kg/m2  SpO2 92%  LMP 04/04/2015 Body mass index is 26.37 kg/(m^2). Wt Readings from Last 3 Encounters:  04/11/15 161 lb (73.029 kg)  11/29/14 181 lb 12.8 oz (82.464 kg)  10/20/14 180 lb (81.647 kg)   Constitutional: overweight, in NAD Eyes: PERRLA, EOMI,  no exophthalmos ENT: moist mucous membranes, no thyromegaly, no cervical lymphadenopathy Cardiovascular: RRR, No MRG, no LE edema Respiratory: CTA B Gastrointestinal: abdomen soft, NT, ND, BS+ Musculoskeletal: no deformities, strength intact in all 4 Skin: moist, warm, no rashes Neurological: no tremor with outstretched hands, DTR normal in all 4  ASSESSMENT: 1. DM1, insulin-dependent, uncontrolled, without complications - refuses a pump - DR  Received labs from [facility] 08/03/2013: - HbA1c: 12.2% - Hb 9.6/HT 30.6, MCV 81 (78-100) - iron 47 (42-145), TIBC 378 (250-470) - CMP with Glu 263, A phos 178 (39-117), BUN/Cr 16/0.98, OTW normal - TSH 1.059  PLAN:  1. Patient with long-standing, uncontrolled diabetes, on basal-bolus insulin, a little more compliant with her insulin doses and sugar checks. Sugars are better especially after she changed her diet to a mostly vegan one and she also lost weight since last visit. - I suggested to stay on the same regimen and continue to work on compliance: Patient Instructions  Please continue Lantus 20-22 units at bedtime.  - Humalog: - small meal: 4 units  - larger meal: 6 units   - Sliding scale for Humalog insulin: - 150-175: + 1 unit  - 176-200: + 2 units  - 201-225: + 3 units  - 226-250: + 4 units  - > 251: + 5 units  Please bolus 15 min before each meal!  Please return in 3 months with your log.  - Strongly advised her to start checking sugars and bolusing at least 3-4 times a day, rotating checks - advised for yearly eye exams >> she is UTD - check HbA1c today >> 8.5% (much better!) - RTC in 3 mo.

## 2015-04-11 NOTE — Patient Instructions (Signed)
Please continue Lantus 20-22 units at bedtime.  - Humalog: - small meal: 4 units  - larger meal: 6 units   - Sliding scale for Humalog insulin: - 150-175: + 1 unit  - 176-200: + 2 units  - 201-225: + 3 units  - 226-250: + 4 units  - > 251: + 5 units  Please bolus 15 min before each meal!  Please return in 3 months with your log.

## 2015-05-04 DIAGNOSIS — I219 Acute myocardial infarction, unspecified: Secondary | ICD-10-CM

## 2015-05-04 HISTORY — DX: Acute myocardial infarction, unspecified: I21.9

## 2015-05-18 ENCOUNTER — Encounter: Payer: Self-pay | Admitting: Family

## 2015-05-18 ENCOUNTER — Telehealth: Payer: Self-pay | Admitting: Family

## 2015-05-18 NOTE — Telephone Encounter (Signed)
Caller name: Self   Can be reached: [phone]  Pharmacy: [address], [place], [place] [address]  Phone: [phone]   Reason for call: Patient was d/c from [facility] on Monday 11/07 after having a heart attack. Needs to get her diabetic meds and need to have them changed as follow. Lantus needs to be Levemir Humalog needs to be Novalog

## 2015-05-18 NOTE — Telephone Encounter (Signed)
Please contact pt to arrange hospital follow up. 

## 2015-05-18 NOTE — Telephone Encounter (Signed)
Left msg for pt to call in and schedule hospital f/u appt (30 min)

## 2015-05-19 NOTE — Telephone Encounter (Signed)
[name]-- Pt is needing hospital follow up with Melissa. She was discharged from [facility] on 05/16/15 (see 05/18/15) phone note. Do you need to do TCM call?  Refill has been sent for each medication above.

## 2015-05-19 NOTE — Telephone Encounter (Signed)
Please see subsequent mychart message from pt asking that we disregard this message.

## 2015-05-23 ENCOUNTER — Telehealth: Payer: Self-pay

## 2015-05-23 NOTE — Telephone Encounter (Signed)
Admit date:  05/04/15 Discharge date: 05/16/15  Hospital: [facility]   Reason for admission: Ketoacidosis and MI   Pt had been without insurance and ran out of her Lantus for 2 days.  On Wednesday, Oct 26, husband found her on the floor and she was rushed to the hospital.  Per patient, her blood sugar was very high when she arrive.  She was diagnosed with Ketoacidosis and later a MI per patient.  Since discharge, she has been able to reinstate her insurance, she now has Lantus insulin, and has started on heart medication with the exception of lipitor.  Since discharge, pt states he blood sugars have ranged from 64-200 something.  Pt states it took her a few days to get back on medication, therefore numbers maybe due to that.  Pt's only complaint at this time is having trouble sleeping.  States sleep pattern is off.  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     Hospital Follow Up appt scheduled with Sandford Craze on 05/30/15.  Discussed symptoms that would prompt patient to return to ER.  Pt stated understanding and agreed to comply.

## 2015-05-24 MED ORDER — INSULIN GLARGINE 100 UNIT/ML SOLOSTAR PEN: 20.0000 [IU] | PEN_INJECTOR | Freq: Every day | SUBCUTANEOUS | Status: DC

## 2015-05-24 MED ORDER — INSULIN LISPRO 100 UNIT/ML (KWIKPEN)
6.0000 [IU] | PEN_INJECTOR | Freq: Three times a day (TID) | SUBCUTANEOUS | Status: DC
Start: 2015-05-24 — End: 2016-08-08

## 2015-05-30 ENCOUNTER — Ambulatory Visit: Payer: 59 | Admitting: Family

## 2015-06-06 ENCOUNTER — Encounter: Payer: Self-pay | Admitting: Family

## 2015-06-06 ENCOUNTER — Ambulatory Visit (INDEPENDENT_AMBULATORY_CARE_PROVIDER_SITE_OTHER): Payer: 59 | Admitting: Family

## 2015-06-06 VITALS — BP 117/78 | HR 88 | Temp 98.2°F | Resp 16 | Ht 65.0 in | Wt 164.0 lb

## 2015-06-06 DIAGNOSIS — I5020 Unspecified systolic (congestive) heart failure: Secondary | ICD-10-CM

## 2015-06-06 DIAGNOSIS — I25119 Atherosclerotic heart disease of native coronary artery with unspecified angina pectoris: Secondary | ICD-10-CM

## 2015-06-06 DIAGNOSIS — Z114 Encounter for screening for human immunodeficiency virus [HIV]: Secondary | ICD-10-CM | POA: Diagnosis not present

## 2015-06-06 DIAGNOSIS — E559 Vitamin D deficiency, unspecified: Secondary | ICD-10-CM

## 2015-06-06 DIAGNOSIS — I10 Essential (primary) hypertension: Secondary | ICD-10-CM

## 2015-06-06 DIAGNOSIS — D649 Anemia, unspecified: Secondary | ICD-10-CM

## 2015-06-06 NOTE — Progress Notes (Signed)
Pre visit review using our clinic review tool, if applicable. No additional management support is needed unless otherwise documented below in the visit note. 

## 2015-06-06 NOTE — Progress Notes (Signed)
Subjective:    Patient ID: Abigail Moran, female    DOB: 01-23-1976, 39 y.o.   MRN: [id number]  HPI  Abigail Moran is a 39 yr old female who presents today for hospital follow up. Pt was admitted to [facility] medical center 05/04/15 with DKA and NSTEMI. Reviewed records in Care Everywhere.  She was transfused 2 units of PRBC's due to anemia and insulin was adjusted.  She ultimately had an abnormal nuclear stress test which was followed by a cardiac cath at [facility].  Cath showed moderate LAD disease.  LVEF 35% She was advised to follow up with Abigail Moran in 2-3 weeks. She has cardiology follow up on 12/19.    DM2- Pt reports that she ran out of insulin prior to her admission which precipitated her DKA.  She briefly had a lapse in her insurance and reports that during this time she could not afford her insulin. Her coverage has been restored and she is back on her medicatio.  She reports that her sugars are 100- to low 200's. She isusing lantus 22 units.  Using 3-4 units of humalog with her meals.  She reports that she does get intermittent chest discomfort "when my sugar is low."  Denies current chest pain.  Lab Results  Component Value Date   HGBA1C 8.5 04/11/2015   HGBA1C 10.5* 11/29/2014   HGBA1C 12.5* 08/16/2014   Lab Results  Component Value Date   MICROALBUR 0.9 09/13/2014   LDLCALC 91 09/27/2014   CREATININE 0.89 12/13/2014   She reports that she has intermittent nausea/vomitting which she attributes to "pancreatitis." Reports that her last episode was prior to her admission in October.  Denies current GI issues.     Review of Systems See HPI  Past Medical History  Diagnosis Date  . Diabetes mellitus without complication (HCC)   . History of kidney stones   . Allergy   . Vitamin D deficiency 09/14/2014  . Heart attack (HCC) 05/04/15    Social History   Social History  . Marital Status: Single    Spouse Name: N/A  . Number of Children: N/A  . Years of  Education: N/A   Occupational History  . Not on file.   Social History Main Topics  . Smoking status: Never Smoker   . Smokeless tobacco: Never Used  . Alcohol Use: 0.0 oz/week    0 Standard drinks or equivalent per week     Comment: social  . Drug Use: Yes     Comment: occasional marijuana  for nausea  . Sexual Activity: Yes   Other Topics Concern  . 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 Cholecystectomy  2001  . Cardiac catheterization  2016    Family History  Problem Relation Age of Onset  . Diabetes Father   . Hypertension Father   . Hyperlipidemia Father   . Cancer Maternal Grandfather     liver and ? colon  . Kidney disease Neg Hx   . Heart disease Neg Hx     Allergies  Allergen Reactions  . Latex     itching  . Tape     hives    Current Outpatient Prescriptions on File Prior to Visit  Medication Sig  Dispense Refill  . aspirin EC 81 MG tablet Take 81 mg by mouth daily.    Marland Kitchen atorvastatin (LIPITOR) 20 MG tablet Take 20 mg by mouth daily.    . clopidogrel (PLAVIX) 75 MG tablet Take 75 mg by mouth daily.    Marland Kitchen glucose blood (FREESTYLE LITE) test strip Use 3x a day 300 each 11  . Insulin Glargine (LANTUS SOLOSTAR) 100 UNIT/ML Solostar Pen Inject 20-22 Units into the skin daily at 10 pm. 5 pen 0  . insulin lispro (HUMALOG KWIKPEN) 100 UNIT/ML KiwkPen Inject 0.06-0.15 mLs (6-15 Units total) into the skin 3 (three) times daily. 15 mL 0  . Insulin Pen Needle 31G X 8 MM MISC Use as directed 100 each 3  . Lancets (FREESTYLE) lancets Use 4x a day 300 each 11  . lisinopril (PRINIVIL,ZESTRIL) 2.5 MG tablet Take 2.5 mg by mouth at bedtime.    . metoprolol succinate (TOPROL-XL) 25 MG 24 hr tablet Take 25 mg by mouth daily.    . Multiple Vitamin (MULTIVITAMIN) tablet Take 1 tablet by mouth daily.      . hydrochlorothiazide (HYDRODIURIL) 25 MG tablet One tab by mouth once daily as needed for swelling (Patient not taking: Reported on 06/06/2015) 30 tablet 5  . Vitamin D, Ergocalciferol, (DRISDOL) 50000 UNITS CAPS capsule Take 1 capsule (50,000 Units total) by mouth every 7 (seven) days. (Patient not taking: Reported on 06/06/2015) 12 capsule 0   No current facility-administered medications on file prior to visit.    BP 117/78 mmHg  Pulse 88  Temp(Src) 98.2 F (36.8 C) (Oral)  Resp 16  Ht  (1.651 m)  Wt 164 lb (74.39 kg)  BMI 27.29 kg/m2  SpO2 100%  LMP 05/06/2015       Objective:   Physical Exam  Constitutional: She is oriented to person, place, and time. She appears well-developed and well-nourished.  HENT:  Head: Normocephalic and atraumatic.  Eyes: No scleral icterus.  Cardiovascular: Normal rate, regular rhythm and normal heart sounds.   No murmur heard. Pulmonary/Chest: Effort normal and breath sounds normal. No respiratory distress. She has no wheezes.  Musculoskeletal:  1+ bilateral LE edema  Neurological: She is alert and oriented to person, place, and time.  Skin: Skin is warm and dry.  Psychiatric: She has a normal mood and affect. Her behavior is normal. Judgment and thought content normal.          Assessment & Plan:  Advised pt to seek care if she develops recurrent GI issues such as her "pancreatitis"

## 2015-06-06 NOTE — Patient Instructions (Signed)
Please complete lab work prior to leaving. Follow up in 3 months.  

## 2015-06-07 ENCOUNTER — Encounter: Payer: Self-pay | Admitting: Family

## 2015-06-07 DIAGNOSIS — I2510 Atherosclerotic heart disease of native coronary artery without angina pectoris: Secondary | ICD-10-CM | POA: Insufficient documentation

## 2015-06-07 DIAGNOSIS — I509 Heart failure, unspecified: Secondary | ICD-10-CM | POA: Insufficient documentation

## 2015-06-07 LAB — CBC WITH DIFFERENTIAL/PLATELET
BASOS ABS: 0 10*3/uL (ref 0.0–0.1)
BASOS PCT: 0.7 % (ref 0.0–3.0)
EOS ABS: 0.1 10*3/uL (ref 0.0–0.7)
Eosinophils Relative: 1.6 % (ref 0.0–5.0)
HCT: 32.4 % — ABNORMAL LOW (ref 36.0–46.0)
Hemoglobin: 10.6 g/dL — ABNORMAL LOW (ref 12.0–15.0)
LYMPHS ABS: 1.7 10*3/uL (ref 0.7–4.0)
Lymphocytes Relative: 30.3 % (ref 12.0–46.0)
MCHC: 32.6 g/dL (ref 30.0–36.0)
MCV: 85.9 fl (ref 78.0–100.0)
Monocytes Absolute: 0.4 10*3/uL (ref 0.1–1.0)
Monocytes Relative: 6.5 % (ref 3.0–12.0)
NEUTROS ABS: 3.4 10*3/uL (ref 1.4–7.7)
NEUTROS PCT: 60.9 % (ref 43.0–77.0)
PLATELETS: 350 10*3/uL (ref 150.0–400.0)
RBC: 3.77 Mil/uL — ABNORMAL LOW (ref 3.87–5.11)
RDW: 17.4 % — AB (ref 11.5–15.5)
WBC: 5.6 10*3/uL (ref 4.0–10.5)

## 2015-06-07 LAB — BASIC METABOLIC PANEL
BUN: 13 mg/dL (ref 6–23)
CO2: 23 mEq/L (ref 19–32)
Calcium: 9.4 mg/dL (ref 8.4–10.5)
Chloride: 102 mEq/L (ref 96–112)
Creatinine, Ser: 0.82 mg/dL (ref 0.40–1.20)
GFR: 98.98 mL/min (ref >60.00)
Glucose, Bld: 166 mg/dL — ABNORMAL HIGH (ref 70–99)
Potassium: 4.1 mEq/L (ref 3.5–5.1)
Sodium: 134 mEq/L — ABNORMAL LOW (ref 135–145)

## 2015-06-07 LAB — HIV ANTIBODY (ROUTINE TESTING W REFLEX): HIV 1&2 Ab, 4th Generation: NONREACTIVE

## 2015-06-07 LAB — VITAMIN D 25 HYDROXY (VIT D DEFICIENCY, FRACTURES): VITD: 21.63 ng/mL — ABNORMAL LOW (ref 30.00–100.00)

## 2015-06-07 NOTE — Assessment & Plan Note (Signed)
Advised pt to keep her upcoming appointment with cardiology.

## 2015-06-07 NOTE — Assessment & Plan Note (Signed)
Pt is considering pregnancy- advised pt to discuss this with her cardiologist prior to trying to become pregnant as I am concerned her heart is not strong/healthy enough for pregnancy.

## 2015-06-07 NOTE — Assessment & Plan Note (Signed)
Improved now that she is back on her insulin. I advised pt to continue current insulin doses and keep upcoming endo appointment.

## 2015-06-07 NOTE — Assessment & Plan Note (Signed)
S/p transfusion, obtain follow up cbc.

## 2015-06-08 ENCOUNTER — Telehealth: Payer: Self-pay | Admitting: Family

## 2015-06-08 DIAGNOSIS — R7989 Other specified abnormal findings of blood chemistry: Secondary | ICD-10-CM

## 2015-06-08 NOTE — Telephone Encounter (Signed)
Vitamin D level is low.  Advise patient to begin vit D 50000 units once weekly for 12 weeks, then repeat vit D level (dx Vit D deficiency).    Anemia is stable.

## 2015-06-09 ENCOUNTER — Telehealth: Payer: Self-pay | Admitting: Family

## 2015-06-09 DIAGNOSIS — Z09 Encounter for follow-up examination after completed treatment for conditions other than malignant neoplasm: Secondary | ICD-10-CM

## 2015-06-09 MED ORDER — VITAMIN D (ERGOCALCIFEROL) 1.25 MG (50000 UNIT) PO CAPS: 50000.0000 [IU] | ORAL_CAPSULE | ORAL | Status: DC

## 2015-06-09 NOTE — Telephone Encounter (Signed)
Pt called and states that her appt is already scheduled with another provider. She said she told us before. It doesn't look like it was relayed before. Appt 06/27/15 with....   Thomes Cake., MD   Affiliated with [facility]  Specialties: Cardiology  More Details > [facility] [place]  [phone]

## 2015-06-09 NOTE — Telephone Encounter (Signed)
Caller name: Self   Can be reached: [phone]    Reason for call: Patient needs referral sent to Cardiologist for appointment on 12/19. States that Efraim Kaufmann was to send it at her last visit.

## 2015-06-09 NOTE — Telephone Encounter (Signed)
Referral placed. Done. Pt notified.

## 2015-06-09 NOTE — Telephone Encounter (Addendum)
rx sent  Pt notified. 

## 2015-07-18 ENCOUNTER — Ambulatory Visit: Payer: 59 | Admitting: Internal Medicine

## 2015-09-12 ENCOUNTER — Telehealth: Payer: Self-pay | Admitting: Family

## 2015-09-12 ENCOUNTER — Ambulatory Visit: Payer: Self-pay | Admitting: Family

## 2015-09-13 ENCOUNTER — Encounter: Payer: Self-pay | Admitting: Family

## 2015-09-13 NOTE — Telephone Encounter (Signed)
Pt was no show 09/12/15 2:00pm for follow up appt, 2nd no show w/in 12 months, charge or no charge?

## 2015-09-13 NOTE — Telephone Encounter (Signed)
Marked to charge and mailing no show letter °

## 2015-09-13 NOTE — Telephone Encounter (Signed)
Yes please

## 2015-12-09 ENCOUNTER — Telehealth: Payer: Self-pay | Admitting: Family

## 2015-12-09 NOTE — Telephone Encounter (Signed)
Please let pt know that we do not have any samples of lantus. Does she see an endocrinologist for her blood sugars?  If so, she could see if they have any available. I see she requested appt through mychart and she is past due. Was supposed to have followed up in February. Thanks!

## 2015-12-09 NOTE — Telephone Encounter (Signed)
Replied to my chart appt request notifying pt we do not have samples and to contact her endocrinologist and again telling her to call the office for appt with our phone# as calls are not being accepted when I dial her.

## 2015-12-09 NOTE — Telephone Encounter (Signed)
Pt requested appt thru mychart. Phone # is not accepting calls. Do we have samples of Lantus?    >','<< Less Detail',event)" href="javascript:;">More Detail >>   Appointment Request   Abigail Moran   Sent: Caleen Essex December 09, 2015 3:07 PM   To: [facility] Nihitha Hewey   MRN: [id number] DOB: [birth year]   Pt Work: [phone] Pt Home: [phone]   Entered: [phone]      Message    Appointment Request From: Estell Harpin      With Provider: Lemont Fillers., NP [[facility]]      Preferred Date Range: From 12/12/2015 To 12/13/2015      Reason for visit: Office Visit      Comments:   Check up and prescription assistance have paperwork need approval ASAP out of Lantus. Do you samples

## 2015-12-11 ENCOUNTER — Telehealth: Payer: Self-pay | Admitting: Family

## 2015-12-11 NOTE — Telephone Encounter (Signed)
Received note from [facility]- pt was recently hospitalized with mild DKA. Needs OV. Please contact pt to arrange OV this week.

## 2015-12-12 ENCOUNTER — Telehealth: Payer: Self-pay | Admitting: Family

## 2015-12-12 ENCOUNTER — Encounter: Payer: Self-pay | Admitting: Family

## 2015-12-12 ENCOUNTER — Ambulatory Visit (INDEPENDENT_AMBULATORY_CARE_PROVIDER_SITE_OTHER): Payer: Self-pay | Admitting: Family

## 2015-12-12 ENCOUNTER — Encounter (HOSPITAL_BASED_OUTPATIENT_CLINIC_OR_DEPARTMENT_OTHER): Payer: Self-pay | Admitting: Emergency Medicine

## 2015-12-12 ENCOUNTER — Ambulatory Visit: Payer: Self-pay | Admitting: Family

## 2015-12-12 ENCOUNTER — Emergency Department (HOSPITAL_BASED_OUTPATIENT_CLINIC_OR_DEPARTMENT_OTHER)
Admission: EM | Admit: 2015-12-12 | Discharge: 2015-12-12 | Disposition: A | Discharge status: Home / Self Care | Payer: Self-pay | Attending: Emergency Medicine | Admitting: Emergency Medicine

## 2015-12-12 VITALS — BP 115/68 | HR 112 | Temp 98.3°F | Resp 14 | Ht 65.0 in | Wt 160.8 lb

## 2015-12-12 DIAGNOSIS — R112 Nausea with vomiting, unspecified: Secondary | ICD-10-CM | POA: Insufficient documentation

## 2015-12-12 DIAGNOSIS — R109 Unspecified abdominal pain: Secondary | ICD-10-CM | POA: Insufficient documentation

## 2015-12-12 DIAGNOSIS — Z794 Long term (current) use of insulin: Secondary | ICD-10-CM | POA: Insufficient documentation

## 2015-12-12 DIAGNOSIS — E119 Type 2 diabetes mellitus without complications: Secondary | ICD-10-CM | POA: Insufficient documentation

## 2015-12-12 DIAGNOSIS — E10649 Type 1 diabetes mellitus with hypoglycemia without coma: Secondary | ICD-10-CM

## 2015-12-12 DIAGNOSIS — Z7982 Long term (current) use of aspirin: Secondary | ICD-10-CM | POA: Insufficient documentation

## 2015-12-12 LAB — BASIC METABOLIC PANEL
Anion gap: 11 (ref 5–15)
BUN: 17 mg/dL (ref 6–20)
CALCIUM: 9.4 mg/dL (ref 8.9–10.3)
CO2: 25 mmol/L (ref 22–32)
CREATININE: 0.96 mg/dL (ref 0.44–1.00)
Chloride: 99 mmol/L — ABNORMAL LOW (ref 101–111)
GFR calc Af Amer: >60 mL/min (ref >60)
GFR calc non Af Amer: >60 mL/min (ref >60)
GLUCOSE: 83 mg/dL (ref 65–99)
Potassium: 3.8 mmol/L (ref 3.5–5.1)
Sodium: 135 mmol/L (ref 135–145)

## 2015-12-12 LAB — CBC WITH DIFFERENTIAL/PLATELET
Basophils Absolute: 0 10*3/uL (ref 0.0–0.1)
Basophils Relative: 0 %
EOS PCT: 2 %
Eosinophils Absolute: 0.1 10*3/uL (ref 0.0–0.7)
HEMATOCRIT: 34.1 % — AB (ref 36.0–46.0)
Hemoglobin: 11.1 g/dL — ABNORMAL LOW (ref 12.0–15.0)
LYMPHS PCT: 26 %
Lymphs Abs: 1.2 10*3/uL (ref 0.7–4.0)
MCH: 26.7 pg (ref 26.0–34.0)
MCHC: 32.6 g/dL (ref 30.0–36.0)
MCV: 82 fL (ref 78.0–100.0)
MONO ABS: 0.4 10*3/uL (ref 0.1–1.0)
MONOS PCT: 9 %
NEUTROS ABS: 2.9 10*3/uL (ref 1.7–7.7)
Neutrophils Relative %: 63 %
PLATELETS: 407 10*3/uL — AB (ref 150–400)
RBC: 4.16 MIL/uL (ref 3.87–5.11)
RDW: 16.3 % — AB (ref 11.5–15.5)
WBC: 4.7 10*3/uL (ref 4.0–10.5)

## 2015-12-12 LAB — I-STAT VENOUS BLOOD GAS, ED
ACID-BASE EXCESS: 2 mmol/L (ref 0.0–2.0)
BICARBONATE: 27.8 meq/L — AB (ref 20.0–24.0)
O2 SAT: 15 %
TCO2: 29 mmol/L (ref 0–100)
pCO2, Ven: 49 mmHg (ref 45.0–50.0)
pH, Ven: 7.361 — ABNORMAL HIGH (ref 7.250–7.300)
pO2, Ven: 14 mmHg — ABNORMAL LOW (ref 31.0–45.0)

## 2015-12-12 LAB — HEPATIC FUNCTION PANEL
ALT: 30 U/L (ref 14–54)
AST: 34 U/L (ref 15–41)
Albumin: 3.3 g/dL — ABNORMAL LOW (ref 3.5–5.0)
Alkaline Phosphatase: 325 U/L — ABNORMAL HIGH (ref 38–126)
BILIRUBIN TOTAL: 0.6 mg/dL (ref 0.3–1.2)
Total Protein: 8.8 g/dL — ABNORMAL HIGH (ref 6.5–8.1)

## 2015-12-12 LAB — URINALYSIS, ROUTINE W REFLEX MICROSCOPIC
GLUCOSE, UA: 500 mg/dL — AB
HGB URINE DIPSTICK: NEGATIVE
KETONES UR: 15 mg/dL — AB
Leukocytes, UA: NEGATIVE
NITRITE: NEGATIVE
PH: 6.5 (ref 5.0–8.0)
Protein, ur: NEGATIVE mg/dL
SPECIFIC GRAVITY, URINE: 1.02 (ref 1.005–1.030)

## 2015-12-12 LAB — LIPASE, BLOOD: LIPASE: 30 U/L (ref 11–51)

## 2015-12-12 LAB — GLUCOSE, POCT (MANUAL RESULT ENTRY): POC Glucose: 194 mg/dl — AB (ref 70–99)

## 2015-12-12 LAB — CBG MONITORING, ED: Glucose-Capillary: 75 mg/dL (ref 65–99)

## 2015-12-12 MED ORDER — MORPHINE SULFATE (PF) 4 MG/ML IV SOLN
4.0000 mg | Freq: Once | INTRAVENOUS | Status: AC
  Administered 2015-12-12: 4 mg via INTRAVENOUS
  Filled 2015-12-12: qty 1

## 2015-12-12 MED ORDER — ONDANSETRON HCL 4 MG/2ML IJ SOLN
4.0000 mg | Freq: Once | INTRAMUSCULAR | Status: AC
  Administered 2015-12-12: 4 mg via INTRAVENOUS
  Filled 2015-12-12: qty 2

## 2015-12-12 MED ORDER — SODIUM CHLORIDE 0.9 % IV BOLUS (SEPSIS)
1000.0000 mL | Freq: Once | INTRAVENOUS | Status: AC
  Administered 2015-12-12: 1000 mL via INTRAVENOUS

## 2015-12-12 MED ORDER — HYDROCODONE-ACETAMINOPHEN 5-325 MG PO TABS: 1.0000 | ORAL_TABLET | Freq: Four times a day (QID) | ORAL | Status: DC | PRN

## 2015-12-12 MED ORDER — ONDANSETRON HCL 4 MG PO TABS: 4.0000 mg | ORAL_TABLET | Freq: Four times a day (QID) | ORAL | Status: DC

## 2015-12-12 NOTE — Discharge Instructions (Signed)

## 2015-12-12 NOTE — ED Notes (Signed)
Pt rang out wanting more medication for pain and nausea.  Provider notified.  Pt has not vomited since arriving and she was asleep prior to getting up to the bathroom.

## 2015-12-12 NOTE — ED Notes (Signed)
Sent from office upstairs  vomiting

## 2015-12-12 NOTE — ED Provider Notes (Signed)
CSN: [id number]     Arrival date & time 12/12/15  1718 History   First MD Initiated Contact with Patient 12/12/15 1733     Chief Complaint  Patient presents with  . Emesis     (Consider location/radiation/quality/duration/timing/severity/associated sxs/prior Treatment) HPI Comments: Patient presents to the emergency department with chief complaint of nausea and vomiting. She was recently admitted to the hospital for nausea and vomiting and mild DKA. She states that she is an insulin-controlled diabetic. She states that she has run out of her Lantus, and has been using her Humalog only. She states that she was discharged from the hospital a few days ago, and was feeling better until today when she began vomiting again. She denies any associated fevers or chills. Denies any chest pain or shortness of breath. She states that she does have some epigastric abdominal pain, which she believes is secondary to vomiting. She denies any dysuria. Denies any diarrhea or constipation. There are no modifying factors. There are no other associated symptoms.  The history is provided by the patient. No language interpreter was used.    Past Medical History  Diagnosis Date  . Diabetes mellitus without complication (HCC)   . History of kidney stones   . Allergy   . Vitamin D deficiency 09/14/2014  . Heart attack (HCC) 05/04/15   Past Surgical History  Procedure Laterality Date  . Cholecystectomy  2001  . Cardiac catheterization  2016   Family History  Problem Relation Age of Onset  . Diabetes Father   . Hypertension Father   . Hyperlipidemia Father   . Cancer Maternal Grandfather     liver and ? colon  . Kidney disease Neg Hx   . Heart disease Neg Hx    Social History  Substance Use Topics  . Smoking status: Never Smoker   . Smokeless tobacco: Never Used  . Alcohol Use: 0.0 oz/week    0 Standard drinks or equivalent per week     Comment: social   OB History    No data available     Review  of Systems  Constitutional: Negative for fever and chills.  Respiratory: Negative for shortness of breath.   Cardiovascular: Negative for chest pain.  Gastrointestinal: Positive for nausea, vomiting and abdominal pain. Negative for diarrhea and constipation.  Genitourinary: Negative for dysuria.      Allergies  Latex and Tape  Home Medications   Prior to Admission medications   Medication Sig Start Date End Date Taking? Authorizing Provider  aspirin EC 81 MG tablet Take 81 mg by mouth daily.    Historical Provider, MD  atorvastatin (LIPITOR) 20 MG tablet Take 20 mg by mouth daily. Reported on 12/12/2015    Historical Provider, MD  clopidogrel (PLAVIX) 75 MG tablet Take 75 mg by mouth daily. Reported on 12/12/2015    Historical Provider, MD  glucose blood (FREESTYLE LITE) test strip Use 3x a day Patient not taking: Reported on 12/12/2015 10/06/13   Carlus Pavlov, MD  hydrochlorothiazide (HYDRODIURIL) 25 MG tablet One tab by mouth once daily as needed for swelling Patient not taking: Reported on 06/06/2015 10/11/14   Sandford Craze, NP  Insulin Glargine (LANTUS SOLOSTAR) 100 UNIT/ML Solostar Pen Inject 20-22 Units into the skin daily at 10 pm. 05/24/15   Sandford Craze, NP  insulin lispro (HUMALOG KWIKPEN) 100 UNIT/ML KiwkPen Inject 0.06-0.15 mLs (6-15 Units total) into the skin 3 (three) times daily. 05/24/15   Sandford Craze, NP  Insulin Pen Needle 31G X  8 MM MISC Use as directed 09/13/14   Sandford Craze, NP  Lancets (FREESTYLE) lancets Use 4x a day Patient not taking: Reported on 12/12/2015 10/06/13   Carlus Pavlov, MD  lisinopril (PRINIVIL,ZESTRIL) 2.5 MG tablet Take 2.5 mg by mouth at bedtime. Reported on 12/12/2015    Historical Provider, MD  metoprolol succinate (TOPROL-XL) 25 MG 24 hr tablet Take 25 mg by mouth daily. Reported on 12/12/2015    Historical Provider, MD  Multiple Vitamin (MULTIVITAMIN) tablet Take 1 tablet by mouth daily. Reported on 12/12/2015    Historical  Provider, MD  Vitamin D, Ergocalciferol, (DRISDOL) 50000 UNITS CAPS capsule Take 1 capsule (50,000 Units total) by mouth every 7 (seven) days. Patient not taking: Reported on 12/12/2015 06/09/15   Sandford Craze, NP   BP 112/93 mmHg  Pulse 101  Temp(Src) 98.3 F (36.8 C) (Oral)  Resp 18  Ht 5' 5.5" (1.664 m)  Wt 72.576 kg  BMI 26.21 kg/m2  SpO2 100%  LMP 11/27/2015 Physical Exam  Constitutional: She is oriented to person, place, and time. She appears well-developed and well-nourished.  HENT:  Head: Normocephalic and atraumatic.  Eyes: Conjunctivae and EOM are normal. Pupils are equal, round, and reactive to light.  Neck: Normal range of motion. Neck supple.  Cardiovascular: Normal rate and regular rhythm.  Exam reveals no gallop and no friction rub.   No murmur heard. Pulmonary/Chest: Effort normal and breath sounds normal. No respiratory distress. She has no wheezes. She has no rales. She exhibits no tenderness.  Abdominal: Soft. Bowel sounds are normal. She exhibits no distension and no mass. There is no tenderness. There is no rebound and no guarding.  No focal abdominal tenderness, no RLQ tenderness or pain at McBurney's point, no RUQ tenderness or Murphy's sign, no left-sided abdominal tenderness, no fluid wave, or signs of peritonitis   Musculoskeletal: Normal range of motion. She exhibits no edema or tenderness.  Neurological: She is alert and oriented to person, place, and time.  Skin: Skin is warm and dry.  Psychiatric: She has a normal mood and affect. Her behavior is normal. Judgment and thought content normal.  Nursing note and vitals reviewed.   ED Course  Procedures (including critical care time)  Results for orders placed or performed during the hospital encounter of 12/12/15  CBC with Differential/Platelet  Result Value Ref Range   WBC 4.7 4.0 - 10.5 K/uL   RBC 4.16 3.87 - 5.11 MIL/uL   Hemoglobin 11.1 (L) 12.0 - 15.0 g/dL   HCT 16.1 (L) 09.6 - 04.5 %   MCV  82.0 78.0 - 100.0 fL   MCH 26.7 26.0 - 34.0 pg   MCHC 32.6 30.0 - 36.0 g/dL   RDW 40.9 (H) 81.1 - 91.4 %   Platelets 407 (H) 150 - 400 K/uL   Neutrophils Relative % 63 %   Neutro Abs 2.9 1.7 - 7.7 K/uL   Lymphocytes Relative 26 %   Lymphs Abs 1.2 0.7 - 4.0 K/uL   Monocytes Relative 9 %   Monocytes Absolute 0.4 0.1 - 1.0 K/uL   Eosinophils Relative 2 %   Eosinophils Absolute 0.1 0.0 - 0.7 K/uL   Basophils Relative 0 %   Basophils Absolute 0.0 0.0 - 0.1 K/uL  Basic metabolic panel  Result Value Ref Range   Sodium 135 135 - 145 mmol/L   Potassium 3.8 3.5 - 5.1 mmol/L   Chloride 99 (L) 101 - 111 mmol/L   CO2 25 22 - 32 mmol/L  Glucose, Bld 83 65 - 99 mg/dL   BUN 17 6 - 20 mg/dL   Creatinine, Ser 1.61 0.44 - 1.00 mg/dL   Calcium 9.4 8.9 - 09.6 mg/dL   GFR calc non Af Amer >60 >60 mL/min   GFR calc Af Amer >60 >60 mL/min   Anion gap 11 5 - 15  Urinalysis, Routine w reflex microscopic (not at [facility])  Result Value Ref Range   Color, Urine YELLOW YELLOW   APPearance CLEAR CLEAR   Specific Gravity, Urine 1.020 1.005 - 1.030   pH 6.5 5.0 - 8.0   Glucose, UA 500 (A) NEGATIVE mg/dL   Hgb urine dipstick NEGATIVE NEGATIVE   Bilirubin Urine SMALL (A) NEGATIVE   Ketones, ur 15 (A) NEGATIVE mg/dL   Protein, ur NEGATIVE NEGATIVE mg/dL   Nitrite NEGATIVE NEGATIVE   Leukocytes, UA NEGATIVE NEGATIVE  Hepatic function panel  Result Value Ref Range   Total Protein 8.8 (H) 6.5 - 8.1 g/dL   Albumin 3.3 (L) 3.5 - 5.0 g/dL   AST 34 15 - 41 U/L   ALT 30 14 - 54 U/L   Alkaline Phosphatase 325 (H) 38 - 126 U/L   Total Bilirubin 0.6 0.3 - 1.2 mg/dL   Bilirubin, Direct <0.4 (L) 0.1 - 0.5 mg/dL   Indirect Bilirubin NOT CALCULATED 0.3 - 0.9 mg/dL  Lipase, blood  Result Value Ref Range   Lipase 30 11 - 51 U/L  POC CBG, ED  Result Value Ref Range   Glucose-Capillary 75 65 - 99 mg/dL  I-Stat venous blood gas, ED  Result Value Ref Range   pH, Ven 7.361 (H) 7.250 - 7.300   pCO2, Ven 49.0 45.0 -  50.0 mmHg   pO2, Ven 14.0 (L) 31.0 - 45.0 mmHg   Bicarbonate 27.8 (H) 20.0 - 24.0 mEq/L   TCO2 29 0 - 100 mmol/L   O2 Saturation 15.0 %   Acid-Base Excess 2.0 0.0 - 2.0 mmol/L   Patient temperature HIDE    Sample type VENOUS    Comment VALUES EXPECTED, NO REPEAT    No results found.   I have personally reviewed and evaluated these images and lab results as part of my medical decision-making.    MDM   Final diagnoses:  Non-intractable vomiting with nausea, vomiting of unspecified type   Patient with recent hospitalization for nausea, vomiting, and mild DKA, was referred to the emergency department by her primary care provider because of persistent vomiting. Will check labs, give fluids, treat pain and nausea, and reassess.  Venous pH is 7.361.  Potassium is 3.8, anion gap is 11. Patient is not vomiting, but still feels nauseated. No focal abdominal tenderness. Patient discussed with Dr. Clayborne Dana, who agrees with plan for discharge to home. Recommend close follow-up with her primary care provider. Patient understands and agrees to plan. She is stable and ready for discharge.     Roxy Horseman, PA-C 12/12/15 2132  Marily Memos, MD 12/13/15 [phone]

## 2015-12-12 NOTE — Patient Instructions (Signed)
Please proceed to the first floor Emergency departement for further evaluation.

## 2015-12-12 NOTE — ED Notes (Signed)
PA at bedside.

## 2015-12-12 NOTE — ED Notes (Signed)
Reports being out of her lantus but has been using humalog. States she has been nauseated for a while but vomiting has just began.

## 2015-12-12 NOTE — Telephone Encounter (Signed)
Coming in today

## 2015-12-12 NOTE — ED Notes (Signed)
Pt asking again for something for nausea and pain, provider aware

## 2015-12-12 NOTE — Progress Notes (Signed)
Subjective:    Patient ID: Abigail Moran, female    DOB: [birth year], 40 y.o.   MRN: [id number]  HPI  Abigail Moran is a 40 yr old female who presents today for hospital follow up of her "mild DKA."  Record is reviewed from [place]. Pt was evaluated on 12/06/15 with abdominal pain, nausea/vomiting, hypokalemia and UTI.  K+ was as low as 2.4 during her hospitalization.  K+ was 3.7 at the time of discharge.  She was given IV fluids. She was hospitalized x 6 days.  She has been home for 10 days. Reports that she was ok until the last few days.  She reports ongoing nausea.  Began with nausea today.  She reports + PO intake.  Reports that she is drinking water.    Review of Systems See HPI  Past Medical History  Diagnosis Date  . Diabetes mellitus without complication (HCC)   . History of kidney stones   . Allergy   . Vitamin D deficiency 09/14/2014  . Heart attack (HCC) 05/04/15     Social History   Social History  . Marital Status: Single    Spouse Name: N/A  . Number of Children: N/A  . Years of Education: N/A   Occupational History  . Not on file.   Social History Main Topics  . Smoking status: Never Smoker   . Smokeless tobacco: Never Used  . Alcohol Use: 0.0 oz/week    0 Standard drinks or equivalent per week     Comment: social  . Drug Use: Yes     Comment: occasional marijuana  for nausea  . Sexual Activity: Yes   Other Topics Concern  . 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 Cholecystectomy  2001  . Cardiac catheterization  2016    Family History  Problem Relation Age of Onset  . Diabetes Father   . Hypertension Father   . Hyperlipidemia Father   . Cancer Maternal Grandfather     liver and ? colon  . Kidney disease Neg Hx   . Heart disease Neg Hx     Allergies  Allergen Reactions  .  Latex     itching  . Tape     hives    Current Outpatient Prescriptions on File Prior to Visit  Medication Sig Dispense Refill  . aspirin EC 81 MG tablet Take 81 mg by mouth daily.    . Insulin Glargine (LANTUS SOLOSTAR) 100 UNIT/ML Solostar Pen Inject 20-22 Units into the skin daily at 10 pm. 5 pen 0  . insulin lispro (HUMALOG KWIKPEN) 100 UNIT/ML KiwkPen Inject 0.06-0.15 mLs (6-15 Units total) into the skin 3 (three) times daily. 15 mL 0  . Insulin Pen Needle 31G X 8 MM MISC Use as directed 100 each 3  . atorvastatin (LIPITOR) 20 MG tablet Take 20 mg by mouth daily. Reported on 12/12/2015    . clopidogrel (PLAVIX) 75 MG tablet Take 75 mg by mouth daily. Reported on 12/12/2015    . glucose blood (FREESTYLE LITE) test strip Use 3x a day (Patient not taking: Reported on 12/12/2015) 300 each 11  . hydrochlorothiazide (HYDRODIURIL) 25 MG tablet One tab by mouth once daily as needed for swelling (Patient not taking: Reported on  06/06/2015) 30 tablet 5  . Lancets (FREESTYLE) lancets Use 4x a day (Patient not taking: Reported on 12/12/2015) 300 each 11  . lisinopril (PRINIVIL,ZESTRIL) 2.5 MG tablet Take 2.5 mg by mouth at bedtime. Reported on 12/12/2015    . metoprolol succinate (TOPROL-XL) 25 MG 24 hr tablet Take 25 mg by mouth daily. Reported on 12/12/2015    . Multiple Vitamin (MULTIVITAMIN) tablet Take 1 tablet by mouth daily. Reported on 12/12/2015    . Vitamin D, Ergocalciferol, (DRISDOL) 50000 UNITS CAPS capsule Take 1 capsule (50,000 Units total) by mouth every 7 (seven) days. (Patient not taking: Reported on 12/12/2015) 12 capsule 0   No current facility-administered medications on file prior to visit.    BP 115/68 mmHg  Pulse 112  Temp(Src) 98.3 F (36.8 C) (Oral)  Resp 14  Ht 5\' 5"  (1.651 m)  Wt 160 lb 12.8 oz (72.938 kg)  BMI 26.76 kg/m2  SpO2 100%  LMP 11/27/2015       Objective:   Physical Exam  Constitutional: She is oriented to person, place, and time.  Ill appearing AA female  vomiting clear emesis throughout the visit.   HENT:  Head: Normocephalic and atraumatic.  Cardiovascular: Normal rate, regular rhythm and normal heart sounds.   No murmur heard. Pulmonary/Chest: Effort normal and breath sounds normal. No respiratory distress. She has no wheezes.  Soft right sided expiratory wheeze.   Abdominal: Soft. Bowel sounds are normal.  Mild generalized abdominal tenderness.   Musculoskeletal: She exhibits no edema.  Neurological: She is alert and oriented to person, place, and time.  Psychiatric: She has a normal mood and affect. Her behavior is normal. Judgment and thought content normal.          Assessment & Plan:  Nausea/vomiting- sugar is OK today.  Pt brings with her today forms for pt assist for her insulins.  We will fill and return to patient.  I am concerned about possibility of dehydration and hypokalemia. She is advised to go directly to the ED for further evaluation.

## 2015-12-13 NOTE — Telephone Encounter (Signed)
Pt was late 12/12/15 for 3:15pm appt and stayed for appt at 4:15pm, charge or no charge? 2nd or 3rd no show appt in 12 months.

## 2015-12-13 NOTE — Telephone Encounter (Signed)
No charge. 

## 2015-12-14 ENCOUNTER — Telehealth: Payer: Self-pay | Admitting: Family

## 2015-12-14 NOTE — Telephone Encounter (Signed)
Left detailed message on cell# that forms are ready for pick up at the front desk. Pt will need to complete her portion and attach appropriate documentation to mail to companies. Copies sent for scanning.

## 2015-12-14 NOTE — Telephone Encounter (Signed)
Patient assist forms filled for humalog pen and lantus solostar.

## 2015-12-19 ENCOUNTER — Telehealth: Payer: Self-pay | Admitting: Family

## 2015-12-19 NOTE — Telephone Encounter (Signed)
°  Self   Can be [phone]  Reason for call: Patient called stating that her pharmacist from [facility] told her to take 70/30 insulin until she can get the rx that was given to her. States she needs to know how much to take.

## 2016-02-20 ENCOUNTER — Telehealth: Payer: Self-pay | Admitting: *Deleted

## 2016-02-20 MED ORDER — INSULIN GLARGINE 100 UNIT/ML ~~LOC~~ SOLN: 20.0000 [IU] | Freq: Every day | SUBCUTANEOUS | 1 refills | Status: DC

## 2016-02-20 NOTE — Telephone Encounter (Signed)
Ok to send vial of lantus please, same dose as pens.

## 2016-02-20 NOTE — Telephone Encounter (Signed)
Received fax from [facility] requesting rx for lantus vial as it would be cheaper since pt doesn't have insurance. Spoke with pt re: status of patient assistance for her insulin. Pt states she was just able to get the papers picked up last Friday due to problems getting transportation. Please advise request for vial?

## 2016-02-20 NOTE — Telephone Encounter (Signed)
Rx sent 

## 2016-08-01 ENCOUNTER — Telehealth: Payer: Self-pay | Admitting: Family

## 2016-08-01 NOTE — Telephone Encounter (Signed)
Pt has not been seen since 12/12/15 and was admitted to hospital 06/2016.  Please advise Lantus request?

## 2016-08-01 NOTE — Telephone Encounter (Signed)
Rx sent.  Pt is due for follow up please.

## 2016-08-02 NOTE — Telephone Encounter (Signed)
Please call patient and schedule a follow up visit.

## 2016-08-02 NOTE — Telephone Encounter (Signed)
Patient scheduled her physical for 08/08/16 at 7:15am with NP

## 2016-08-08 ENCOUNTER — Ambulatory Visit (INDEPENDENT_AMBULATORY_CARE_PROVIDER_SITE_OTHER): Payer: BLUE CROSS/BLUE SHIELD | Admitting: Family

## 2016-08-08 ENCOUNTER — Encounter: Payer: Self-pay | Admitting: Family

## 2016-08-08 VITALS — BP 130/94 | HR 89 | Temp 98.5°F | Ht 65.0 in | Wt 174.6 lb

## 2016-08-08 DIAGNOSIS — D649 Anemia, unspecified: Secondary | ICD-10-CM

## 2016-08-08 DIAGNOSIS — G5601 Carpal tunnel syndrome, right upper limb: Secondary | ICD-10-CM

## 2016-08-08 DIAGNOSIS — G5600 Carpal tunnel syndrome, unspecified upper limb: Secondary | ICD-10-CM | POA: Insufficient documentation

## 2016-08-08 DIAGNOSIS — R7989 Other specified abnormal findings of blood chemistry: Secondary | ICD-10-CM | POA: Diagnosis not present

## 2016-08-08 DIAGNOSIS — Z0000 Encounter for general adult medical examination without abnormal findings: Secondary | ICD-10-CM | POA: Diagnosis not present

## 2016-08-08 DIAGNOSIS — I2510 Atherosclerotic heart disease of native coronary artery without angina pectoris: Secondary | ICD-10-CM

## 2016-08-08 DIAGNOSIS — E10649 Type 1 diabetes mellitus with hypoglycemia without coma: Secondary | ICD-10-CM

## 2016-08-08 DIAGNOSIS — I5020 Unspecified systolic (congestive) heart failure: Secondary | ICD-10-CM

## 2016-08-08 DIAGNOSIS — E559 Vitamin D deficiency, unspecified: Secondary | ICD-10-CM | POA: Diagnosis not present

## 2016-08-08 DIAGNOSIS — E109 Type 1 diabetes mellitus without complications: Secondary | ICD-10-CM | POA: Diagnosis not present

## 2016-08-08 DIAGNOSIS — K3184 Gastroparesis: Secondary | ICD-10-CM

## 2016-08-08 LAB — URINALYSIS, ROUTINE W REFLEX MICROSCOPIC
Bilirubin Urine: NEGATIVE
Ketones, ur: NEGATIVE
Leukocytes, UA: NEGATIVE
Nitrite: NEGATIVE
PH: 6.5 (ref 5.0–8.0)
SPECIFIC GRAVITY, URINE: 1.01 (ref 1.000–1.030)
TOTAL PROTEIN, URINE-UPE24: NEGATIVE
UROBILINOGEN UA: 0.2 (ref 0.0–1.0)

## 2016-08-08 LAB — LIPID PANEL
CHOL/HDL RATIO: 2
Cholesterol: 154 mg/dL (ref 0–200)
HDL: 65.5 mg/dL (ref >39.00)
LDL CALC: 77 mg/dL (ref 0–99)
NONHDL: 88.3
Triglycerides: 58 mg/dL (ref 0.0–149.0)
VLDL: 11.6 mg/dL (ref 0.0–40.0)

## 2016-08-08 LAB — BASIC METABOLIC PANEL
BUN: 16 mg/dL (ref 6–23)
CHLORIDE: 104 meq/L (ref 96–112)
CO2: 24 mEq/L (ref 19–32)
Calcium: 8.8 mg/dL (ref 8.4–10.5)
Creatinine, Ser: 0.9 mg/dL (ref 0.40–1.20)
GFR: 88.38 mL/min (ref >60.00)
Glucose, Bld: 276 mg/dL — ABNORMAL HIGH (ref 70–99)
POTASSIUM: 4.5 meq/L (ref 3.5–5.1)
Sodium: 133 mEq/L — ABNORMAL LOW (ref 135–145)

## 2016-08-08 LAB — TSH: TSH: 1.89 u[IU]/mL (ref 0.35–4.50)

## 2016-08-08 LAB — CBC WITH DIFFERENTIAL/PLATELET
Basophils Absolute: 0 10*3/uL (ref 0.0–0.1)
Basophils Relative: 1 % (ref 0.0–3.0)
EOS ABS: 0.1 10*3/uL (ref 0.0–0.7)
EOS PCT: 3.6 % (ref 0.0–5.0)
HEMATOCRIT: 29.6 % — AB (ref 36.0–46.0)
HEMOGLOBIN: 9.7 g/dL — AB (ref 12.0–15.0)
LYMPHS PCT: 35.5 % (ref 12.0–46.0)
Lymphs Abs: 1.3 10*3/uL (ref 0.7–4.0)
MCHC: 32.9 g/dL (ref 30.0–36.0)
MCV: 87 fl (ref 78.0–100.0)
MONO ABS: 0.3 10*3/uL (ref 0.1–1.0)
Monocytes Relative: 8.4 % (ref 3.0–12.0)
Neutro Abs: 1.9 10*3/uL (ref 1.4–7.7)
Neutrophils Relative %: 51.5 % (ref 43.0–77.0)
Platelets: 284 10*3/uL (ref 150.0–400.0)
RBC: 3.4 Mil/uL — AB (ref 3.87–5.11)
RDW: 15.2 % (ref 11.5–15.5)
WBC: 3.6 10*3/uL — AB (ref 4.0–10.5)

## 2016-08-08 LAB — HEPATIC FUNCTION PANEL
ALT: 15 U/L (ref 0–35)
AST: 22 U/L (ref 0–37)
Albumin: 3.7 g/dL (ref 3.5–5.2)
Alkaline Phosphatase: 128 U/L — ABNORMAL HIGH (ref 39–117)
BILIRUBIN DIRECT: 0.1 mg/dL (ref 0.0–0.3)
BILIRUBIN TOTAL: 0.6 mg/dL (ref 0.2–1.2)
TOTAL PROTEIN: 7.3 g/dL (ref 6.0–8.3)

## 2016-08-08 LAB — IRON: IRON: 47 ug/dL (ref 42–145)

## 2016-08-08 LAB — VITAMIN D 25 HYDROXY (VIT D DEFICIENCY, FRACTURES): VITD: 12.92 ng/mL — ABNORMAL LOW (ref 30.00–100.00)

## 2016-08-08 LAB — HEMOGLOBIN A1C: Hgb A1c MFr Bld: 9.6 % — ABNORMAL HIGH (ref 4.6–6.5)

## 2016-08-08 MED ORDER — INSULIN GLARGINE 100 UNIT/ML SOLOSTAR PEN: 18.0000 [IU] | PEN_INJECTOR | Freq: Every day | SUBCUTANEOUS | 3 refills | Status: DC

## 2016-08-08 MED ORDER — INSULIN LISPRO 100 UNIT/ML (KWIKPEN): PEN_INJECTOR | SUBCUTANEOUS | 5 refills | Status: DC

## 2016-08-08 NOTE — Assessment & Plan Note (Signed)
Will refer to hand surgeon for further evaluation.  

## 2016-08-08 NOTE — Assessment & Plan Note (Signed)
Has chronic symptoms. Will refer to GI for further evaluation.

## 2016-08-08 NOTE — Progress Notes (Signed)
Subjective:    Patient ID: Abigail Moran, female    DOB: [birth year], 41 y.o.   MRN: [id number]  HPI   Patient presents today for complete physical and follow up of multiple medical problems.  She has been uninsured for some time and recently obtained health insurance.    Immunizations: declines flu shot, declines pneumovax Diet: diet is healthy Exercise: plans to restart at the pool. Dances Pap Smear: (has menses today) Mammogram: 09/27/14 Last eye exam was 2016- she will schedule Dental- up to date.    DM2- reports that she is taking 18 units of lantus.  Using 2-3 units of humalog at each meal.  Notes that she sometimes does not take the humalog on time.  She has seen Dr. Elvera Lennox in the past but is overdue for follow up.   Lab Results  Component Value Date   HGBA1C 8.5 04/11/2015   HGBA1C 10.5 (H) 11/29/2014   HGBA1C 12.5 (H) 08/16/2014   Lab Results  Component Value Date   MICROALBUR 0.9 09/13/2014   LDLCALC 91 09/27/2014   CREATININE 0.96 12/12/2015   Anemia-  Lab Results  Component Value Date   WBC 4.7 12/12/2015   HGB 11.1 (L) 12/12/2015   HCT 34.1 (L) 12/12/2015   MCV 82.0 12/12/2015   PLT 407 (H) 12/12/2015    CAD/CHF- had mod LAD disease on cardiac cath at [facility] 2016 with LVEF 35%.  She reports that she has not seen cardiology recently. Would like to see a cardiologist here.  She states that she never took plavix, lisinopril or metoprolol. Wants opinion from a new cardiologist prior to starting these medications.   Chronic nausea/vomitting- She is s/p cholecystectomy. Was told that she has gastroparesis.   She reports that she is followed by Urology- was told that she had a kidney stone (reports non-obstructive kidney stone) - in [place].   She complains about decreased grip strength in her right hand.  Notes some numbness in bilateral 5th fingers R>L.   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  Has  tried wrist braces in the past without improvement.  Review of Systems  Constitutional: Negative for unexpected weight change.  HENT: Negative for hearing loss and rhinorrhea.   Eyes: Negative for visual disturbance.  Respiratory: Negative for cough and shortness of breath.   Cardiovascular: Negative for chest pain.       Notes some mild pedal edema  Gastrointestinal: Positive for vomiting. Negative for constipation and diarrhea.       Notes some chronic nausea  Genitourinary: Negative for dysuria, frequency and menstrual problem.  Musculoskeletal: Negative for myalgias.       Notes some knee pain  Skin: Negative for rash.  Neurological: Negative for headaches.  Hematological: Negative for adenopathy.  Psychiatric/Behavioral:       Denies depression/anxiety   Past Medical History:  Diagnosis Date  . Allergy   . Diabetes mellitus without complication (HCC)   . Heart attack 05/04/15  . History of kidney stones   . Vitamin D deficiency 09/14/2014     Social History   Social History  . Marital status: Single    Spouse name: N/A  . Number of children: N/A  . Years of education: N/A   Occupational History  . Not on file.   Social History Main Topics  . Smoking status: Never Smoker  . Smokeless tobacco: Never Used  . Alcohol use 0.0 oz/week  Comment: social  . Drug use: Yes     Comment: occasional marijuana  for nausea  . Sexual activity: Yes   Other Topics Concern  . 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 CARDIAC CATHETERIZATION  2016  . CHOLECYSTECTOMY  2001    Family History  Problem Relation Age of Onset  . Diabetes Father   . Hypertension Father   . Hyperlipidemia Father   . Cancer Maternal Grandfather     liver and ? colon  . Kidney disease Neg Hx   . Heart disease Neg Hx      Allergies  Allergen Reactions  . Latex     itching  . Tape     hives    Current Outpatient Prescriptions on File Prior to Visit  Medication Sig Dispense Refill  . aspirin EC 81 MG tablet Take 81 mg by mouth daily.    Marland Kitchen glucose blood (FREESTYLE LITE) test strip Use 3x a day 300 each 11  . Insulin Pen Needle 31G X 8 MM MISC Use as directed 100 each 3  . Lancets (FREESTYLE) lancets Use 4x a day 300 each 11  . Multiple Vitamin (MULTIVITAMIN) tablet Take 1 tablet by mouth daily. Reported on 12/12/2015    . Vitamin D, Ergocalciferol, (DRISDOL) 50000 UNITS CAPS capsule Take 1 capsule (50,000 Units total) by mouth every 7 (seven) days. 12 capsule 0  . atorvastatin (LIPITOR) 20 MG tablet Take 20 mg by mouth daily. Reported on 12/12/2015     No current facility-administered medications on file prior to visit.     BP (!) 130/94 (BP Location: Left Arm, Patient Position: Sitting, Cuff Size: Normal)   Pulse 89   Temp 98.5 F (36.9 C) (Oral)   Ht 5\' 5"  (1.651 m)   Wt 174 lb 9.6 oz (79.2 kg)   LMP 08/04/2016 (Exact Date)   SpO2 100%   BMI 29.05 kg/m        Objective:   Physical Exam  Physical Exam  Constitutional: She is oriented to person, place, and time. She appears well-developed and well-nourished. No distress.  HENT:  Head: Normocephalic and atraumatic.  Right Ear: Tympanic membrane and ear canal normal.  Left Ear: Tympanic membrane and ear canal normal.  Mouth/Throat: Oropharynx is clear and moist.  Eyes: Pupils are equal, round, and reactive to light. No scleral icterus.  Neck: Normal range of motion. No thyromegaly present.  Cardiovascular: Normal rate and regular rhythm.   No murmur heard. Pulmonary/Chest: Effort normal and breath sounds normal. No respiratory distress. He has no wheezes. She has no rales. She exhibits no tenderness.  Abdominal: Soft. Bowel sounds are normal. She exhibits no distension and no mass. There is no tenderness. There is no rebound and no  guarding.  Musculoskeletal: She exhibits 2+ bilateral edema.  Lymphadenopathy:    She has no cervical adenopathy.  Neurological: She is alert and oriented to person, place, and time. She has normal patellar reflexes. She exhibits normal muscle tone. Coordination normal.  Neg Tinels, + Phalans on right, decreased hand grip on right Skin: Skin is warm and dry.  Psychiatric: She has a normal mood and affect. Her behavior is normal. Judgment and thought content normal.  Breast/pelvic: deferred  Assessment & Plan:         Assessment & Plan:

## 2016-08-08 NOTE — Patient Instructions (Addendum)
Please schedule a follow up appointment with Dr. Elvera Lennox. You will be contacted about your referral to cardiology and the hand doctory. Please schedule a follow up eye exam with your eye doctor. Complete lab work prior to leaving.

## 2016-08-08 NOTE — Assessment & Plan Note (Signed)
Discussed healthy diet, exercise. She declines pneumovax and flu shot,though I strongly recommended both. Will obtain routine lab work. Refer for mammogram, plan pap/breast exam next visit. Pt has menses today.

## 2016-08-08 NOTE — Assessment & Plan Note (Signed)
No longer taking vit D supplement. Will check follow up vit D level.

## 2016-08-08 NOTE — Assessment & Plan Note (Signed)
Obtain follow up cbc and serum iron level.

## 2016-08-08 NOTE — Progress Notes (Signed)
Pre visit review using our clinic review tool, if applicable. No additional management support is needed unless otherwise documented below in the visit note. 

## 2016-08-08 NOTE — Assessment & Plan Note (Signed)
Reports most recent a1c was 9.  Advised pt to arrange a follow up with Dr. Elvera Lennox.

## 2016-08-08 NOTE — Assessment & Plan Note (Signed)
Will refer to Dr. Jens Som, clinically stable. Advised pt to continue aspirin.

## 2016-08-09 MED ORDER — VITAMIN D (ERGOCALCIFEROL) 1.25 MG (50000 UNIT) PO CAPS: 50000.0000 [IU] | ORAL_CAPSULE | ORAL | 0 refills | Status: DC

## 2016-08-09 NOTE — Addendum Note (Signed)
Addended by: Crissie Sickles A on: 08/09/2016 08:27 AM   Modules accepted: Orders

## 2016-08-10 ENCOUNTER — Telehealth: Payer: Self-pay | Admitting: *Deleted

## 2016-08-10 NOTE — Telephone Encounter (Signed)
Received fax from [employer] not covered by insurance. Plan prefers Novolog and requesting to change Rx to Novolog. Please advise?

## 2016-08-11 MED ORDER — INSULIN ASPART 100 UNIT/ML FLEXPEN: PEN_INJECTOR | SUBCUTANEOUS | 5 refills | Status: DC

## 2016-08-11 NOTE — Telephone Encounter (Signed)
Rx sent. Please notify patient of change.

## 2016-08-13 ENCOUNTER — Ambulatory Visit (HOSPITAL_BASED_OUTPATIENT_CLINIC_OR_DEPARTMENT_OTHER)
Admission: RE | Admit: 2016-08-13 | Discharge: 2016-08-13 | Disposition: A | Discharge status: Home / Self Care | Payer: BLUE CROSS/BLUE SHIELD | Source: Ambulatory Visit | Attending: Family | Admitting: Family

## 2016-08-13 ENCOUNTER — Telehealth: Payer: Self-pay | Admitting: *Deleted

## 2016-08-13 DIAGNOSIS — N6489 Other specified disorders of breast: Secondary | ICD-10-CM | POA: Insufficient documentation

## 2016-08-13 DIAGNOSIS — Z1231 Encounter for screening mammogram for malignant neoplasm of breast: Secondary | ICD-10-CM | POA: Insufficient documentation

## 2016-08-13 DIAGNOSIS — Z0000 Encounter for general adult medical examination without abnormal findings: Secondary | ICD-10-CM

## 2016-08-13 NOTE — Telephone Encounter (Signed)
Attempted to reach pt and left message to check mychart acct. Message sent. 

## 2016-08-13 NOTE — Telephone Encounter (Signed)
-----   Message from Sandford Craze, NP sent at 08/13/2016  4:39 PM EST ----- Please let pt know that the radiologist would like her to complete some additional breast images for further evaluation. Let me know if she has not been contacted by them about a follow up appointment in 1 week.

## 2016-08-13 NOTE — Telephone Encounter (Signed)
Left detailed message on pt's voicemail and to call if any questions. 

## 2016-08-14 ENCOUNTER — Other Ambulatory Visit: Payer: Self-pay | Admitting: Family

## 2016-08-14 DIAGNOSIS — R928 Other abnormal and inconclusive findings on diagnostic imaging of breast: Secondary | ICD-10-CM

## 2016-08-16 ENCOUNTER — Telehealth: Payer: Self-pay | Admitting: Internal Medicine

## 2016-08-16 NOTE — Telephone Encounter (Signed)
Called patient to schedule appt the number she has I think is the wrong phone number, it is a different name. Didn't want to leave a message.

## 2016-08-16 NOTE — Telephone Encounter (Signed)
Thank you. I sent her a mychart message to verify phone number on file.

## 2016-08-17 ENCOUNTER — Telehealth: Payer: Self-pay | Admitting: Family

## 2016-08-17 ENCOUNTER — Other Ambulatory Visit: Payer: Self-pay | Admitting: Family

## 2016-08-17 DIAGNOSIS — R928 Other abnormal and inconclusive findings on diagnostic imaging of breast: Secondary | ICD-10-CM

## 2016-08-17 NOTE — Telephone Encounter (Signed)
Caller name:[facility] Relationship to patient: Can be reached:[phone] ext [phone] Pharmacy:  Reason for call:Please sign off on Mammogram and breast US orders

## 2016-08-17 NOTE — Telephone Encounter (Signed)
Thank you :)

## 2016-08-17 NOTE — Telephone Encounter (Signed)
I believe I just signed them in EPIC.

## 2016-08-20 ENCOUNTER — Other Ambulatory Visit: Payer: BLUE CROSS/BLUE SHIELD

## 2016-08-20 ENCOUNTER — Ambulatory Visit
Admission: RE | Admit: 2016-08-20 | Discharge: 2016-08-20 | Disposition: A | Discharge status: Home / Self Care | Payer: BLUE CROSS/BLUE SHIELD | Source: Ambulatory Visit | Attending: Family | Admitting: Family

## 2016-08-24 ENCOUNTER — Encounter: Payer: Self-pay | Admitting: Internal Medicine

## 2016-09-01 NOTE — Progress Notes (Deleted)
New Outpatient Visit Date: 09/03/2016  Referring Provider: Sandford Craze, NP [address] [place], [place] [address]  Chief Complaint: Establish care with history of CAD and NICM  HPI:  Ms. Douthat is a 41 y.o. year-old female with history of non-obstructive CAD, NICM, and type 1 diabetes mellitus, who has been referred by Dr. Peggyann Juba to establish cardiovascular care. She was hospitalized at [facility] with DKA and NSTEMI in 04/2015. She was noted to have moderate to severe LV dysfunction and stress test demonstrating anteroapical and septal ischemia. Subsequent LHC at [facility] showed moderate LAD disease that was negative by FFR. She was treated medically and follow-up once with Dr. Willeen Cass at [facility] in 06/2015.  --------------------------------------------------------------------------------------------------  Cardiovascular History & Procedures: Cardiovascular Problems:  Coronary artery disease s/p NSTEMI (05/2015)  Non-ischemic cardiomyopathy  Risk Factors:  Known coronary artery disease and diabetes mellitus  Cath/PCI:  LHC (05/16/15, [facility]): LMCA normal. LAD with 40-50% ostial disease (FFR 0.89). LCx normal. RCA normal. LVEF 40% with 1+ MR.  CV Surgery:  None  EP Procedures and Devices:  None  Non-Invasive Evaluation(s):  Pharmacologic myocardial perfusion stress test (05/13/15): Moderate anteroapical and septal reversible perfusion defect with LVEF 34%.  TEE (05/10/15): LV grossly normal in size with borderline LVH. LVEF 35-45%. RV with likely thrombus. No significant valvular abnormalities.  TTE (05/06/15): LV normal size with mild LVH. LVEF 25-30%. Grade 1 diastolic dysfunction. RV normal size with mildly reduced contraction. There eis a 1.2 x 0.9 cm mass in the RV attached to the septal wall. Mild to moderate MR and TR.  Recent CV Pertinent Labs: Lab Results  Component Value Date   CHOL 154  08/08/2016   HDL 65.50 08/08/2016   LDLCALC 77 08/08/2016   TRIG 58.0 08/08/2016   CHOLHDL 2 08/08/2016   K 4.5 08/08/2016   BUN 16 08/08/2016   CREATININE 0.90 08/08/2016    --------------------------------------------------------------------------------------------------  Past Medical History:  Diagnosis Date  . Allergy   . Diabetes mellitus without complication (HCC)   . Heart attack 05/04/15  . History of kidney stones   . Vitamin D deficiency 09/14/2014    Past Surgical History:  Procedure Laterality Date  . CARDIAC CATHETERIZATION  2016  . CHOLECYSTECTOMY  2001    Outpatient Encounter Prescriptions as of 09/03/2016  Medication Sig  . aspirin EC 81 MG tablet Take 81 mg by mouth daily.  Marland Kitchen atorvastatin (LIPITOR) 20 MG tablet Take 20 mg by mouth daily. Reported on 12/12/2015  . glucose blood (FREESTYLE LITE) test strip Use 3x a day  . insulin aspart (NOVOLOG) 100 UNIT/ML FlexPen 2-15 units SQ three times daily prior to meals  . Insulin Glargine (LANTUS SOLOSTAR) 100 UNIT/ML Solostar Pen Inject 18 Units into the skin daily at 10 pm.  . Insulin Pen Needle 31G X 8 MM MISC Use as directed  . Lancets (FREESTYLE) lancets Use 4x a day  . Multiple Vitamin (MULTIVITAMIN) tablet Take 1 tablet by mouth daily. Reported on 12/12/2015  . Vitamin D, Ergocalciferol, (DRISDOL) 50000 units CAPS capsule Take 1 capsule (50,000 Units total) by mouth every 7 (seven) days.   No facility-administered encounter medications on file as of 09/03/2016.     Allergies: Latex and Tape  Social History   Social History  . Marital status: Single    Spouse name: N/A  . Number of children: N/A  . Years of education: N/A   Occupational History  . Not on  file.   Social History Main Topics  . Smoking status: Never Smoker  . Smokeless tobacco: Never Used  . Alcohol use 0.0 oz/week     Comment: social  . Drug use: Yes     Comment: occasional marijuana  for nausea  . Sexual activity: Yes   Other  Topics Concern  . Not on file   Social History Narrative   Works as Social worker    Lives with common law husband   No children   Hair school   Grew up in [place]       Family History  Problem Relation Age of Onset  . Diabetes Father   . Hypertension Father   . Hyperlipidemia Father   . Cancer Maternal Grandfather     liver and ? colon  . Kidney disease Neg Hx   . Heart disease Neg Hx     Review of Systems: A 12-system review of systems was performed and was negative except as noted in the HPI.  --------------------------------------------------------------------------------------------------  Physical Exam: LMP 08/04/2016 (Exact Date)   General:  *** HEENT: No conjunctival pallor or scleral icterus.  Moist mucous membranes.  OP clear. Neck: Supple without lymphadenopathy, thyromegaly, JVD, or HJR.  No carotid bruit. Lungs: Normal work of breathing.  Clear to auscultation bilaterally without wheezes or crackles. Heart: Regular rate and rhythm without murmurs, rubs, or gallops.  Non-displaced PMI. Abd: Bowel sounds present.  Soft, NT/ND without hepatosplenomegaly Ext: No lower extremity edema.  Radial, PT, and DP pulses are 2+ bilaterally Skin: warm and dry without rash Neuro: CNIII-XII intact.  Strength and fine-touch sensation intact in upper and lower extremities bilaterally. Psych: Normal mood and affect.  EKG:  ***  Lab Results  Component Value Date   WBC 3.6 (L) 08/08/2016   HGB 9.7 (L) 08/08/2016   HCT 29.6 (L) 08/08/2016   MCV 87.0 08/08/2016   PLT 284.0 08/08/2016    Lab Results  Component Value Date   NA 133 (L) 08/08/2016   K 4.5 08/08/2016   CL 104 08/08/2016   CO2 24 08/08/2016   BUN 16 08/08/2016   CREATININE 0.90 08/08/2016   GLUCOSE 276 (H) 08/08/2016   ALT 15 08/08/2016    Lab Results  Component Value Date   CHOL 154 08/08/2016   HDL 65.50 08/08/2016   LDLCALC 77 08/08/2016   TRIG 58.0 08/08/2016   CHOLHDL 2 08/08/2016      --------------------------------------------------------------------------------------------------  ASSESSMENT AND PLAN: Cristal Deer Nera Haworth, MD 09/01/2016 3:45 PM

## 2016-09-03 ENCOUNTER — Ambulatory Visit: Payer: BLUE CROSS/BLUE SHIELD | Admitting: Internal Medicine

## 2016-09-06 ENCOUNTER — Encounter: Payer: Self-pay | Admitting: Internal Medicine

## 2016-10-01 ENCOUNTER — Telehealth: Payer: Self-pay | Admitting: Family

## 2016-10-01 MED ORDER — INSULIN ASPART 100 UNIT/ML ~~LOC~~ SOLN: 2.0000 [IU] | Freq: Three times a day (TID) | SUBCUTANEOUS | 3 refills | Status: DC

## 2016-10-01 NOTE — Telephone Encounter (Signed)
Spoke with pt. She thinks cost may be a deductible and thinks vials will be cheaper. Sent Rx for 1 vial x 3 refills. Pt states she has syringes on hand and doesn't need rx for that.

## 2016-10-01 NOTE — Telephone Encounter (Signed)
°  Relation to OV:ZCHY Call back number: [phone]  Pharmacy: [id number] - [place], [place] - [address], [address]  Reason for call:  Patient states insulin aspart (NOVOLOG) 100 UNIT/ML FlexPen cost $400 patient states the "valves" might be cheaper, patient is going out of town towmmorrow, requesting a follow up call, please advise

## 2016-10-02 ENCOUNTER — Other Ambulatory Visit: Payer: Self-pay

## 2016-10-02 MED ORDER — INSULIN ASPART 100 UNIT/ML ~~LOC~~ SOLN: 2.0000 [IU] | Freq: Three times a day (TID) | SUBCUTANEOUS | 3 refills | Status: DC

## 2016-11-05 ENCOUNTER — Ambulatory Visit: Payer: BLUE CROSS/BLUE SHIELD | Admitting: Family

## 2017-05-21 MED ORDER — INSULIN LISPRO 100 UNIT/ML ~~LOC~~ SOLN
SUBCUTANEOUS | Status: DC
Start: 2017-05-19 — End: 2017-05-21

## 2017-05-21 MED ORDER — GENERIC EXTERNAL MEDICATION
Status: DC
Start: ? — End: 2017-05-21

## 2017-05-21 MED ORDER — ONDANSETRON HCL 4 MG/2ML IJ SOLN
4.00 | INTRAMUSCULAR | Status: DC
Start: ? — End: 2017-05-21

## 2017-05-21 MED ORDER — METOPROLOL TARTRATE 25 MG PO TABS
25.00 | ORAL_TABLET | ORAL | Status: DC
Start: 2017-05-19 — End: 2017-05-21

## 2017-05-21 MED ORDER — ACETAMINOPHEN 325 MG PO TABS
650.00 | ORAL_TABLET | ORAL | Status: DC
Start: ? — End: 2017-05-21

## 2017-05-21 MED ORDER — PROMETHAZINE HCL 25 MG PO TABS
12.50 | ORAL_TABLET | ORAL | Status: DC
Start: ? — End: 2017-05-21

## 2017-05-21 MED ORDER — BACITRACIN-NEOMYCIN-POLYMYXIN 400-5-5000 EX OINT
TOPICAL_OINTMENT | CUTANEOUS | Status: DC
Start: 2017-05-19 — End: 2017-05-21

## 2017-05-21 MED ORDER — PROMETHAZINE HCL 25 MG/ML IJ SOLN
25.00 | INTRAMUSCULAR | Status: DC
Start: ? — End: 2017-05-21

## 2017-05-21 MED ORDER — SODIUM CHLORIDE 0.9 % IV SOLN
INTRAVENOUS | Status: DC
Start: ? — End: 2017-05-21

## 2017-05-21 MED ORDER — INSULIN GLARGINE 100 UNIT/ML ~~LOC~~ SOLN
SUBCUTANEOUS | Status: DC
Start: 2017-05-19 — End: 2017-05-21

## 2017-05-21 MED ORDER — NITROGLYCERIN 0.4 MG SL SUBL
.40 | SUBLINGUAL_TABLET | SUBLINGUAL | Status: DC
Start: ? — End: 2017-05-21

## 2017-05-21 MED ORDER — PANTOPRAZOLE SODIUM 40 MG PO TBEC
40.00 | DELAYED_RELEASE_TABLET | ORAL | Status: DC
Start: 2017-05-19 — End: 2017-05-21

## 2017-05-21 MED ORDER — INSULIN LISPRO 100 UNIT/ML ~~LOC~~ SOLN
SUBCUTANEOUS | Status: DC
Start: ? — End: 2017-05-21

## 2018-10-10 ENCOUNTER — Telehealth: Payer: Self-pay | Admitting: Family

## 2018-10-10 NOTE — Telephone Encounter (Signed)
Lm for patient to be aware she is due for follow up and to call for appointmen.

## 2018-10-10 NOTE — Telephone Encounter (Signed)
Please contact pt and let her know that our records show she is past due for follow up. Please offer to schedule her a video visit.

## 2018-10-13 NOTE — Telephone Encounter (Signed)
Talked to patient she does not want to schedule virtual visit at this time. Advised to cal any time to set up.

## 2019-12-22 ENCOUNTER — Ambulatory Visit (INDEPENDENT_AMBULATORY_CARE_PROVIDER_SITE_OTHER): Payer: Self-pay | Admitting: Family

## 2019-12-22 ENCOUNTER — Other Ambulatory Visit: Payer: Self-pay

## 2019-12-22 ENCOUNTER — Encounter: Payer: Self-pay | Admitting: Family

## 2019-12-22 VITALS — BP 116/70 | HR 92 | Temp 98.7°F | Resp 16 | Ht 65.5 in | Wt 182.0 lb

## 2019-12-22 DIAGNOSIS — Z86718 Personal history of other venous thrombosis and embolism: Secondary | ICD-10-CM

## 2019-12-22 DIAGNOSIS — Z01419 Encounter for gynecological examination (general) (routine) without abnormal findings: Secondary | ICD-10-CM

## 2019-12-22 DIAGNOSIS — D649 Anemia, unspecified: Secondary | ICD-10-CM

## 2019-12-22 DIAGNOSIS — N289 Disorder of kidney and ureter, unspecified: Secondary | ICD-10-CM

## 2019-12-22 DIAGNOSIS — E1065 Type 1 diabetes mellitus with hyperglycemia: Secondary | ICD-10-CM

## 2019-12-22 DIAGNOSIS — E611 Iron deficiency: Secondary | ICD-10-CM

## 2019-12-22 LAB — MICROALBUMIN / CREATININE URINE RATIO
Creatinine,U: 118.2 mg/dL
Microalb Creat Ratio: 4.1 mg/g (ref 0.0–30.0)
Microalb, Ur: 4.8 mg/dL — ABNORMAL HIGH (ref 0.0–1.9)

## 2019-12-22 MED ORDER — ATORVASTATIN CALCIUM 20 MG PO TABS: 20.0000 mg | ORAL_TABLET | Freq: Every day | ORAL | 5 refills | Status: DC

## 2019-12-22 NOTE — Progress Notes (Signed)
Subjective:    Patient ID: Abigail Moran, female    DOB: 05/31/1976, 44 y.o.   MRN: [id number]  HPI  Patient is a 44 yr old female who presents today for follow up.  I last saw her in 2018.  She had an episode of hypoglycemia 2/21 with seizure.  No issues since.  DVT of LUE (following hospitalization). She completed 2 months of anticoagulation and then discontinued on her own.  When asked why she discontinued, she states "they didn't show me where it was."   She had CAP with hospitalization 09/2018  She is uninsured so she is using OTC insulin instead of rx as below:  She is using novolin N 20 AM and 15 HS Novolin R uses sliding scale.  Sugars sometimes in the 200's.    Has been improving diet and exercise. 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    Review of Systems   See HPI     Past Medical History:  Diagnosis Date  . Allergy   . Diabetes mellitus without complication ([place])   . Heart attack ([place]) 05/04/15  . History of kidney stones   . Vitamin D deficiency 09/14/2014     Social History   Socioeconomic History  . Marital status: Single    Spouse name: Not on file  . Number of children: Not on file  . Years of education: Not on file  . Highest education level: Not on file  Occupational History  . Not on file  Tobacco Use  . Smoking status: Never Smoker  . Smokeless tobacco: Never Used  Substance and Sexual Activity  . Alcohol use: Yes    Alcohol/week: 0.0 standard drinks    Comment: social  . Drug use: Yes    Comment: occasional marijuana  for nausea  . Sexual activity: Yes  Other Topics Concern  . Not on file  Social History Narrative   Works as Probation officer    Lives with common law husband   No children   Hair school   Grew up in [place]   Social Determinants of Health   Financial Resource Strain:   . Difficulty of Paying Living Expenses:   Food Insecurity:   . Worried About Charity fundraiser in the Last Year:   . Arts development officer in the Last Year:   Transportation Needs:   . Film/video editor (Medical):   Marland Kitchen Lack of Transportation (Non-Medical):   Physical Activity:   . Days of Exercise per Week:   . Minutes of Exercise per Session:   Stress:   . Feeling of Stress :   Social Connections:   . Frequency of Communication with Friends and Family:   . Frequency of Social Gatherings with Friends and Family:   . Attends Religious Services:   . Active Member of Clubs or Organizations:   . Attends Archivist Meetings:   Marland Kitchen Marital Status:   Intimate Partner Violence:   . Fear of Current or Ex-Partner:   . Emotionally Abused:   Marland Kitchen Physically Abused:   . Sexually Abused:     Past Surgical History:  Procedure Laterality Date  . CARDIAC CATHETERIZATION  2016  . CHOLECYSTECTOMY  2001    Family History  Problem Relation Age of Onset  . Diabetes Father   . Hypertension Father   . Hyperlipidemia Father   . Cancer Maternal Grandfather        liver and ? colon  .  Kidney disease Neg Hx   . Heart disease Neg Hx     Allergies  Allergen Reactions  . Latex     itching  . Tape     hives    Current Outpatient Medications on File Prior to Visit  Medication Sig Dispense Refill  . aspirin EC 81 MG tablet Take 81 mg by mouth daily.    . insulin NPH Human (NOVOLIN N) 100 UNIT/ML injection 20 UNITS IN THE MORNING AND 15 UNITS IN THE EVENING WITH FOOD    . Insulin Pen Needle 31G X 8 MM MISC Use as directed 100 each 3  . Lancets (FREESTYLE) lancets Use 4x a day 300 each 11  . Multiple Vitamin (MULTIVITAMIN) tablet Take 1 tablet by mouth daily. Reported on 12/12/2015    . glucose blood (FREESTYLE LITE) test strip Use 3x a day (Patient not taking: Reported on 12/22/2019) 300 each 11  . insulin aspart (NOVOLOG) 100 UNIT/ML injection Inject 2-15 Units into the skin 3 (three) times daily before meals. (Patient not taking: Reported on 12/22/2019) 1 vial 3   No current facility-administered medications on  file prior to visit.    BP 116/70 (BP Location: Left Arm, Patient Position: Sitting, Cuff Size: Small)   Pulse 92   Temp 98.7 F (37.1 C) (Temporal)   Resp 16   Ht 5' 5.5" (1.664 m)   Wt 182 lb (82.6 kg)   SpO2 100%   BMI 29.83 kg/m    Objective:   Physical Exam Constitutional:      Appearance: She is well-developed.  Cardiovascular:     Rate and Rhythm: Normal rate and regular rhythm.     Heart sounds: Normal heart sounds. No murmur heard.   Pulmonary:     Effort: Pulmonary effort is normal. No respiratory distress.     Breath sounds: Normal breath sounds. No wheezing.  Psychiatric:        Behavior: Behavior normal.        Thought Content: Thought content normal.        Judgment: Judgment normal.    Diabetic Foot Exam - Simple   Simple Foot Form Diabetic Foot exam was performed with the following findings: Yes 12/22/2019  2:12 PM  Visual Inspection No deformities, no ulcerations, no other skin breakdown bilaterally: Yes Sensation Testing Intact to touch and monofilament testing bilaterally: Yes Pulse Check Posterior Tibialis and Dorsalis pulse intact bilaterally: Yes Comments           Assessment & Plan:  DM Uncontrolled.  Increase AM NPH from 20 to 22 units and pm NPH from 15 to 17 units.  Lab Results  Component Value Date   HGBA1C 10.6 Repeated and verified X2. (H) 12/22/2019   HGBA1C 9.6 (H) 08/08/2016   HGBA1C 8.5 04/11/2015   Lab Results  Component Value Date   MICROALBUR 4.8 (H) 12/22/2019   LDLCALC 77 08/08/2016   CREATININE 1.24 (H) 12/22/2019   Iron deficiency- iron level is low. Add iron 325mg  once daily. Plan repeat iron in 3 months.  Renal insufficiency- noted on lab work.  Suspect secondary to non-compliance with DM2.  Continue Vasotec.  Hx of DVT- now on aspirin 81mg  once daily. Continue.   This visit occurred during the SARS-CoV-2 public health emergency.  Safety protocols were in place, including screening questions prior to the  visit, additional usage of staff PPE, and extensive cleaning of exam room while observing appropriate contact time as indicated for disinfecting solutions.

## 2019-12-22 NOTE — Patient Instructions (Signed)
Please complete lab work prior to leaving.  Restart aspirin and lipitor.

## 2019-12-23 LAB — CBC WITH DIFFERENTIAL/PLATELET
Basophils Absolute: 0.1 10*3/uL (ref 0.0–0.1)
Basophils Relative: 1.2 % (ref 0.0–3.0)
Eosinophils Absolute: 0.2 10*3/uL (ref 0.0–0.7)
Eosinophils Relative: 4.2 % (ref 0.0–5.0)
HCT: 29 % — ABNORMAL LOW (ref 36.0–46.0)
Hemoglobin: 9.6 g/dL — ABNORMAL LOW (ref 12.0–15.0)
Lymphocytes Relative: 28.8 % (ref 12.0–46.0)
Lymphs Abs: 1.3 10*3/uL (ref 0.7–4.0)
MCHC: 33 g/dL (ref 30.0–36.0)
MCV: 90.3 fl (ref 78.0–100.0)
Monocytes Absolute: 0.4 10*3/uL (ref 0.1–1.0)
Monocytes Relative: 8.9 % (ref 3.0–12.0)
Neutro Abs: 2.5 10*3/uL (ref 1.4–7.7)
Neutrophils Relative %: 56.9 % (ref 43.0–77.0)
Platelets: 289 10*3/uL (ref 150.0–400.0)
RBC: 3.22 Mil/uL — ABNORMAL LOW (ref 3.87–5.11)
RDW: 15.5 % (ref 11.5–15.5)
WBC: 4.4 10*3/uL (ref 4.0–10.5)

## 2019-12-23 LAB — COMPREHENSIVE METABOLIC PANEL
ALT: 10 U/L (ref 0–35)
AST: 14 U/L (ref 0–37)
Albumin: 3.9 g/dL (ref 3.5–5.2)
Alkaline Phosphatase: 109 U/L (ref 39–117)
BUN: 25 mg/dL — ABNORMAL HIGH (ref 6–23)
CO2: 22 mEq/L (ref 19–32)
Calcium: 8.9 mg/dL (ref 8.4–10.5)
Chloride: 102 mEq/L (ref 96–112)
Creatinine, Ser: 1.24 mg/dL — ABNORMAL HIGH (ref 0.40–1.20)
GFR: 56.55 mL/min — ABNORMAL LOW (ref >60.00)
Glucose, Bld: 335 mg/dL — ABNORMAL HIGH (ref 70–99)
Potassium: 4.3 mEq/L (ref 3.5–5.1)
Sodium: 131 mEq/L — ABNORMAL LOW (ref 135–145)
Total Bilirubin: 0.7 mg/dL (ref 0.2–1.2)
Total Protein: 7.2 g/dL (ref 6.0–8.3)

## 2019-12-23 LAB — HEMOGLOBIN A1C: Hgb A1c MFr Bld: 10.6 % — ABNORMAL HIGH (ref 4.6–6.5)

## 2019-12-24 ENCOUNTER — Other Ambulatory Visit: Payer: Self-pay | Admitting: Family

## 2019-12-24 ENCOUNTER — Other Ambulatory Visit: Payer: Self-pay

## 2019-12-24 ENCOUNTER — Telehealth: Payer: Self-pay | Admitting: Family

## 2019-12-24 DIAGNOSIS — D649 Anemia, unspecified: Secondary | ICD-10-CM

## 2019-12-24 MED ORDER — ENALAPRIL MALEATE 2.5 MG PO TABS
2.5000 mg | ORAL_TABLET | Freq: Every day | ORAL | 5 refills | Status: DC
Start: 2019-12-24 — End: 2019-12-24

## 2019-12-24 MED ORDER — ENALAPRIL MALEATE 2.5 MG PO TABS: 2.5000 mg | ORAL_TABLET | Freq: Every day | ORAL | 5 refills | Status: DC

## 2019-12-24 NOTE — Telephone Encounter (Signed)
Patient advised of results and provider's advise. She will pick up new medication today. She understands to sent glucose reports in one week. She already has app in July.

## 2019-12-24 NOTE — Telephone Encounter (Signed)
Please contact patient and let her know that her sugars very uncontrolled and is showing some kidney damage as a result.  She also remains anemic.  BP Readings from Last 3 Encounters:  12/22/19 116/70  08/08/16 (!) 130/94  12/12/15 132/95   I would recommend that she add enalapril once daily for kidney protection.  I would also like her to check her blood sugars twice daily for me and send me her reports via MyChart in 1 week.  Please follow-up in the office in 1 month.

## 2019-12-25 ENCOUNTER — Telehealth: Payer: Self-pay | Admitting: Family

## 2019-12-25 DIAGNOSIS — E611 Iron deficiency: Secondary | ICD-10-CM

## 2019-12-25 LAB — IRON,TIBC AND FERRITIN PANEL
%SAT: 12 % (calc) — ABNORMAL LOW (ref 16–45)
Ferritin: 19 ng/mL (ref 16–232)
Iron: 43 ug/dL (ref 40–190)
TIBC: 366 mcg/dL (calc) (ref 250–450)

## 2019-12-25 MED ORDER — IRON 325 (65 FE) MG PO TABS: 1.0000 | ORAL_TABLET | Freq: Every day | ORAL | 0 refills | Status: DC

## 2019-12-25 NOTE — Telephone Encounter (Signed)
Iron level is low. Please ask pt to add iron 325mg  once daily.   Repeat lab work in 3 months.

## 2019-12-28 ENCOUNTER — Encounter: Payer: Self-pay | Admitting: Family

## 2019-12-28 NOTE — Telephone Encounter (Signed)
Also, her sugar is uncontrolled.  I would like her to increase her NPH to 22 units in the AM and 17 units in the PM. Check sugars bid and send me her readings via mychart in 1 week.

## 2019-12-29 NOTE — Telephone Encounter (Signed)
Lvm for patient to call about results 

## 2019-12-30 NOTE — Telephone Encounter (Signed)
Pt returning your call

## 2019-12-30 NOTE — Telephone Encounter (Signed)
Patient advised of results and provider's advise, she verbalized understanding.  Will be here in July for Physical

## 2020-01-22 ENCOUNTER — Ambulatory Visit (INDEPENDENT_AMBULATORY_CARE_PROVIDER_SITE_OTHER): Payer: Self-pay | Admitting: Family

## 2020-01-22 ENCOUNTER — Other Ambulatory Visit (HOSPITAL_COMMUNITY)
Admission: RE | Admit: 2020-01-22 | Discharge: 2020-01-22 | Disposition: A | Discharge status: Home / Self Care | Payer: Self-pay | Source: Ambulatory Visit | Attending: Family | Admitting: Family

## 2020-01-22 ENCOUNTER — Other Ambulatory Visit: Payer: Self-pay

## 2020-01-22 ENCOUNTER — Encounter: Payer: Self-pay | Admitting: Family

## 2020-01-22 VITALS — BP 119/61 | HR 100 | Temp 98.6°F | Resp 16 | Ht 65.5 in | Wt 191.0 lb

## 2020-01-22 DIAGNOSIS — N6320 Unspecified lump in the left breast, unspecified quadrant: Secondary | ICD-10-CM

## 2020-01-22 DIAGNOSIS — Z01419 Encounter for gynecological examination (general) (routine) without abnormal findings: Secondary | ICD-10-CM | POA: Insufficient documentation

## 2020-01-22 DIAGNOSIS — E1065 Type 1 diabetes mellitus with hyperglycemia: Secondary | ICD-10-CM

## 2020-01-22 DIAGNOSIS — Z0000 Encounter for general adult medical examination without abnormal findings: Secondary | ICD-10-CM

## 2020-01-22 DIAGNOSIS — Z1159 Encounter for screening for other viral diseases: Secondary | ICD-10-CM

## 2020-01-22 MED ORDER — INSULIN REGULAR HUMAN 100 UNIT/ML IJ SOLN: INTRAMUSCULAR | 11 refills | Status: DC

## 2020-01-22 NOTE — Progress Notes (Signed)
Subjective:    Patient ID: Abigail Moran, female    DOB: July 23, 1975, 44 y.o.   MRN: [id number]  HPI   Patient presents today for complete physical.  Immunizations: declines covid vaccination Diet: healthy Exercise: needs to do more Pap Smear:  Mammogram: several years ago Plans to schedule vision  Dental: due  DM2- reports that she has had some sugars as low as 38. These lows typically occur upon waking in the AM. Last episode occurred a few days ago.  Taking 20 units NPH AM and 17 PM Lab Results  Component Value Date   HGBA1C 10.6 Repeated and verified X2. (H) 12/22/2019   HGBA1C 9.6 (H) 08/08/2016   HGBA1C 8.5 04/11/2015   Lab Results  Component Value Date   MICROALBUR 4.8 (H) 12/22/2019   LDLCALC 77 08/08/2016   CREATININE 1.24 (H) 12/22/2019      Review of Systems  Constitutional: Negative for unexpected weight change.  HENT: Negative for hearing loss and rhinorrhea.   Eyes: Positive for visual disturbance (needs reading glasses).  Respiratory: Negative for cough and shortness of breath.   Cardiovascular: Negative for chest pain.  Gastrointestinal: Negative for constipation and diarrhea.  Genitourinary: Negative for dysuria and frequency.  Musculoskeletal: Negative for arthralgias and myalgias.  Skin: Negative for rash.  Neurological: Negative for headaches.  Hematological: Negative for adenopathy.  Psychiatric/Behavioral:       Denies depression/anxiety   Past Medical History:  Diagnosis Date  . Allergy   . Diabetes mellitus without complication (HCC)   . Heart attack (HCC) 05/04/15  . History of DVT (deep vein thrombosis)    following hospitalization 2020  . History of kidney stones   . Vitamin D deficiency 09/14/2014     Social History   Socioeconomic History  . Marital status: Single    Spouse name: Not on file  . Number of children: Not on file  . Years of education: Not on file  . Highest education level: Not on file  Occupational History    . Not on file  Tobacco Use  . Smoking status: Never Smoker  . Smokeless tobacco: Never Used  Substance and Sexual Activity  . Alcohol use: Yes    Alcohol/week: 0.0 standard drinks    Comment: social  . Drug use: Yes    Comment: occasional marijuana  for nausea  . Sexual activity: Yes  Other Topics Concern  . 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 Difficulty of Paying Living Expenses:   Food Insecurity:   . Worried About Programme researcher, broadcasting/film/video in the Last Year:   . Barista in the Last Year:   Transportation Needs:   . Freight forwarder (Medical):   Marland Kitchen Lack of Transportation (Non-Medical):   Physical Activity:   . Days of Exercise per Week:   . Minutes of Exercise per Session:   Stress:   . Feeling of Stress :   Social Connections:   . Frequency of Communication with Friends and Family:   . Frequency of Social Gatherings with Friends and Family:   . Attends Religious Services:   . Active Member of Clubs or Organizations:   . Attends Banker Meetings:   Marland Kitchen Marital Status:   Intimate  Partner Violence:   . Fear of Current or Ex-Partner:   . Emotionally Abused:   Marland Kitchen Physically Abused:   . Sexually Abused:     Past Surgical History:  Procedure Laterality Date  . CARDIAC CATHETERIZATION  2016  . CHOLECYSTECTOMY  2001    Family History  Problem Relation Age of Onset  . Diabetes Father   . Hypertension Father   . Hyperlipidemia Father   . Cancer Maternal Grandfather        liver and ? colon  . Kidney disease Neg Hx   . Heart disease Neg Hx     Allergies  Allergen Reactions  . Latex     itching  . Tape     hives    Current Outpatient Medications on File Prior to Visit  Medication Sig Dispense Refill  . aspirin EC 81 MG tablet Take 81 mg by mouth daily.    Marland Kitchen  atorvastatin (LIPITOR) 20 MG tablet Take 1 tablet (20 mg total) by mouth daily. Reported on 12/12/2015 30 tablet 5  . enalapril (VASOTEC) 2.5 MG tablet Take 1 tablet (2.5 mg total) by mouth daily. 30 tablet 5  . Ferrous Sulfate (IRON) 325 (65 Fe) MG TABS Take 1 tablet (325 mg total) by mouth daily. 30 tablet 0  . glucose blood (FREESTYLE LITE) test strip Use 3x a day 300 each 11  . insulin aspart (NOVOLOG) 100 UNIT/ML injection Inject 2-15 Units into the skin 3 (three) times daily before meals. 1 vial 3  . insulin NPH Human (NOVOLIN N) 100 UNIT/ML injection 20 UNITS IN THE MORNING AND 15 UNITS IN THE EVENING WITH FOOD    . Insulin Pen Needle 31G X 8 MM MISC Use as directed 100 each 3  . Lancets (FREESTYLE) lancets Use 4x a day 300 each 11  . Multiple Vitamin (MULTIVITAMIN) tablet Take 1 tablet by mouth daily. Reported on 12/12/2015     No current facility-administered medications on file prior to visit.    BP 119/61 (BP Location: Right Arm, Patient Position: Sitting, Cuff Size: Small)   Pulse 100   Temp 98.6 F (37 C) (Oral)   Resp 16   Ht 5' 5.5" (1.664 m)   Wt 191 lb (86.6 kg)   SpO2 100%   BMI 31.30 kg/m        Objective:   Physical Exam  Physical Exam  Constitutional: She is oriented to person, place, and time. She appears well-developed and well-nourished. No distress.  HENT:  Head: Normocephalic and atraumatic.  Right Ear: Tympanic membrane and ear canal normal.  Left Ear: Tympanic membrane and ear canal normal.  Mouth/Throat: Oropharynx is clear and moist.  Eyes: Pupils are equal, round, and reactive to light. No scleral icterus.  Neck: Normal range of motion. No thyromegaly present.  Cardiovascular: Normal rate and regular rhythm.   No murmur heard. Pulmonary/Chest: Effort normal and breath sounds normal. No respiratory distress. He has no wheezes. She has no rales. She exhibits no tenderness.  Abdominal: Soft. Bowel sounds are normal. She exhibits no distension and no  mass. There is no tenderness. There is no rebound and no guarding.  Musculoskeletal: She exhibits no edema.  Lymphadenopathy:    She has no cervical adenopathy.  Neurological: She is alert and oriented to person, place, and time. She has normal patellar reflexes. She exhibits normal muscle tone. Coordination normal.  Skin: Skin is warm and dry.  Psychiatric: She has a normal mood and affect. Her behavior is normal.  Judgment and thought content normal.  Breasts: Examined lying Right: Without masses, retractions, discharge or axillary adenopathy.  Left: + mass noted beneath and lateral to left areola. Inguinal/mons: Normal without inguinal adenopathy  External genitalia: Normal  BUS/Urethra/Skene's glands: Normal  Bladder: Normal  Vagina: Normal  Cervix: Normal  Uterus: normal in size, shape and contour. Midline and mobile  Adnexa/parametria:  Rt: Without masses or tenderness.  Lt: Without masses or tenderness.  Anus and perineum: Normal            Assessment & Plan:   Left breast mass- new. refer for diagnostic mammo/us  DM2- uncontrolled- AM lows- advised pt to decrease PM NPH from 17 units to 14 units.   Preventative care- refer for mammo. She plans to schedule eye exam. Pap performed today.  Obtain labs as ordered.        Assessment & Plan:

## 2020-01-22 NOTE — Patient Instructions (Addendum)
Please go to the pharmacy and request Pneumovax 23 vaccine. Decrease your novolin R evening dose from 17 units to 14 units.  Sliding scale for your Novolin R is as follows:  sliding scale- check sugar and inject 3 times daily before meals as below:  <150-   Zero units 150-200 2 units 201-250 4 units 251-300 6 units 301-350 8 units 351-400 10 units >400             12 units and contact us.

## 2020-01-25 ENCOUNTER — Other Ambulatory Visit: Payer: Self-pay

## 2020-01-27 ENCOUNTER — Encounter: Payer: Self-pay | Admitting: Family

## 2020-01-27 LAB — CYTOLOGY - PAP
Comment: NEGATIVE
Diagnosis: NEGATIVE
High risk HPV: NEGATIVE

## 2020-01-28 ENCOUNTER — Other Ambulatory Visit: Payer: Self-pay | Admitting: Family

## 2020-01-28 NOTE — Progress Notes (Signed)
Mailed out to pt 

## 2020-01-29 ENCOUNTER — Other Ambulatory Visit: Payer: Self-pay | Admitting: *Deleted

## 2020-01-29 ENCOUNTER — Other Ambulatory Visit (INDEPENDENT_AMBULATORY_CARE_PROVIDER_SITE_OTHER): Payer: Self-pay

## 2020-01-29 ENCOUNTER — Other Ambulatory Visit: Payer: Self-pay

## 2020-01-29 DIAGNOSIS — Z0000 Encounter for general adult medical examination without abnormal findings: Secondary | ICD-10-CM

## 2020-01-29 DIAGNOSIS — N6320 Unspecified lump in the left breast, unspecified quadrant: Secondary | ICD-10-CM

## 2020-01-29 DIAGNOSIS — E611 Iron deficiency: Secondary | ICD-10-CM

## 2020-01-29 DIAGNOSIS — Z1159 Encounter for screening for other viral diseases: Secondary | ICD-10-CM

## 2020-01-29 LAB — IBC + FERRITIN
Ferritin: 10.8 ng/mL (ref 10.0–291.0)
Iron: 26 ug/dL — ABNORMAL LOW (ref 42–145)
Saturation Ratios: 6.1 % — ABNORMAL LOW (ref 20.0–50.0)
Transferrin: 305 mg/dL (ref 212.0–360.0)

## 2020-01-29 LAB — LIPID PANEL
Cholesterol: 176 mg/dL (ref 0–200)
HDL: 71.1 mg/dL (ref >39.00)
LDL Cholesterol: 93 mg/dL (ref 0–99)
NonHDL: 104.59
Total CHOL/HDL Ratio: 2
Triglycerides: 57 mg/dL (ref 0.0–149.0)
VLDL: 11.4 mg/dL (ref 0.0–40.0)

## 2020-01-29 LAB — CBC WITH DIFFERENTIAL/PLATELET
Basophils Absolute: 0 10*3/uL (ref 0.0–0.1)
Basophils Relative: 0.7 % (ref 0.0–3.0)
Eosinophils Absolute: 0.2 10*3/uL (ref 0.0–0.7)
Eosinophils Relative: 4.5 % (ref 0.0–5.0)
HCT: 30 % — ABNORMAL LOW (ref 36.0–46.0)
Hemoglobin: 9.8 g/dL — ABNORMAL LOW (ref 12.0–15.0)
Lymphocytes Relative: 29.8 % (ref 12.0–46.0)
Lymphs Abs: 1.3 10*3/uL (ref 0.7–4.0)
MCHC: 32.7 g/dL (ref 30.0–36.0)
MCV: 90.1 fl (ref 78.0–100.0)
Monocytes Absolute: 0.2 10*3/uL (ref 0.1–1.0)
Monocytes Relative: 5.1 % (ref 3.0–12.0)
Neutro Abs: 2.5 10*3/uL (ref 1.4–7.7)
Neutrophils Relative %: 59.9 % (ref 43.0–77.0)
Platelets: 261 10*3/uL (ref 150.0–400.0)
RBC: 3.34 Mil/uL — ABNORMAL LOW (ref 3.87–5.11)
RDW: 16 % — ABNORMAL HIGH (ref 11.5–15.5)
WBC: 4.2 10*3/uL (ref 4.0–10.5)

## 2020-01-29 LAB — TSH: TSH: 1.23 u[IU]/mL (ref 0.35–4.50)

## 2020-01-29 NOTE — Addendum Note (Signed)
Addended by: Harley Alto on: 01/29/2020 09:34 AM   Modules accepted: Orders

## 2020-01-30 LAB — TIQ- AMBIGUOUS ORDER

## 2020-02-02 ENCOUNTER — Telehealth: Payer: Self-pay | Admitting: Family

## 2020-02-02 ENCOUNTER — Ambulatory Visit: Payer: Self-pay

## 2020-02-02 DIAGNOSIS — D509 Iron deficiency anemia, unspecified: Secondary | ICD-10-CM

## 2020-02-02 LAB — TEST AUTHORIZATION

## 2020-02-02 LAB — HEPATITIS C ANTIBODY
Hepatitis C Ab: NONREACTIVE
SIGNAL TO CUT-OFF: 0.01 (ref <1.00)

## 2020-02-02 LAB — LIPID PANEL
Cholesterol: 199 mg/dL (ref <200)
HDL: 79 mg/dL (ref >=50)
LDL Cholesterol (Calc): 105 mg/dL (calc) — ABNORMAL HIGH
Non-HDL Cholesterol (Calc): 120 mg/dL (calc) (ref <130)
Total CHOL/HDL Ratio: 2.5 (calc) (ref <5.0)
Triglycerides: 63 mg/dL (ref <150)

## 2020-02-02 LAB — TSH

## 2020-02-02 LAB — IRON,TIBC AND FERRITIN PANEL
Ferritin: 18 ng/mL (ref 16–232)
Iron: 39 ug/dL — ABNORMAL LOW (ref 40–190)

## 2020-02-02 LAB — HIV ANTIBODY (ROUTINE TESTING W REFLEX): HIV 1&2 Ab, 4th Generation: NONREACTIVE

## 2020-02-02 MED ORDER — IRON 325 (65 FE) MG PO TABS: 1.0000 | ORAL_TABLET | Freq: Two times a day (BID) | ORAL | 0 refills | Status: DC

## 2020-02-02 NOTE — Telephone Encounter (Signed)
Please advise pt that iron is low and she is still anemic. Please increase iron 325mg  from once daily to twice daily. Repeat labs in 3 months.  Cholesterol looks good.  Thyroid looks good.  Hep C testing and hiv testing is negative.

## 2020-02-03 NOTE — Telephone Encounter (Signed)
Patient advised of all results and medication increase. She has appointment in 3 months.

## 2020-02-04 ENCOUNTER — Other Ambulatory Visit: Payer: Self-pay

## 2020-02-18 ENCOUNTER — Other Ambulatory Visit: Payer: Self-pay

## 2020-02-18 ENCOUNTER — Ambulatory Visit: Payer: Self-pay

## 2020-03-17 ENCOUNTER — Inpatient Hospital Stay (HOSPITAL_COMMUNITY): Payer: Self-pay

## 2020-03-17 ENCOUNTER — Other Ambulatory Visit: Payer: Self-pay

## 2020-03-17 ENCOUNTER — Inpatient Hospital Stay (HOSPITAL_BASED_OUTPATIENT_CLINIC_OR_DEPARTMENT_OTHER)
Admission: EM | Admit: 2020-03-17 | Discharge: 2020-03-19 | DRG: 603 | Disposition: A | Discharge status: Home / Self Care | Payer: Self-pay | Attending: Internal Medicine | Admitting: Internal Medicine

## 2020-03-17 ENCOUNTER — Encounter (HOSPITAL_BASED_OUTPATIENT_CLINIC_OR_DEPARTMENT_OTHER): Payer: Self-pay | Admitting: *Deleted

## 2020-03-17 ENCOUNTER — Emergency Department (HOSPITAL_BASED_OUTPATIENT_CLINIC_OR_DEPARTMENT_OTHER): Payer: Self-pay

## 2020-03-17 DIAGNOSIS — E10621 Type 1 diabetes mellitus with foot ulcer: Secondary | ICD-10-CM | POA: Diagnosis present

## 2020-03-17 DIAGNOSIS — E1040 Type 1 diabetes mellitus with diabetic neuropathy, unspecified: Secondary | ICD-10-CM | POA: Diagnosis present

## 2020-03-17 DIAGNOSIS — Z79899 Other long term (current) drug therapy: Secondary | ICD-10-CM

## 2020-03-17 DIAGNOSIS — Z833 Family history of diabetes mellitus: Secondary | ICD-10-CM

## 2020-03-17 DIAGNOSIS — Z8249 Family history of ischemic heart disease and other diseases of the circulatory system: Secondary | ICD-10-CM

## 2020-03-17 DIAGNOSIS — N289 Disorder of kidney and ureter, unspecified: Secondary | ICD-10-CM

## 2020-03-17 DIAGNOSIS — L03115 Cellulitis of right lower limb: Principal | ICD-10-CM | POA: Diagnosis present

## 2020-03-17 DIAGNOSIS — Z20822 Contact with and (suspected) exposure to covid-19: Secondary | ICD-10-CM | POA: Diagnosis present

## 2020-03-17 DIAGNOSIS — Z7982 Long term (current) use of aspirin: Secondary | ICD-10-CM

## 2020-03-17 DIAGNOSIS — I10 Essential (primary) hypertension: Secondary | ICD-10-CM | POA: Diagnosis present

## 2020-03-17 DIAGNOSIS — K3184 Gastroparesis: Secondary | ICD-10-CM | POA: Diagnosis present

## 2020-03-17 DIAGNOSIS — E785 Hyperlipidemia, unspecified: Secondary | ICD-10-CM | POA: Diagnosis present

## 2020-03-17 DIAGNOSIS — E1043 Type 1 diabetes mellitus with diabetic autonomic (poly)neuropathy: Secondary | ICD-10-CM | POA: Diagnosis present

## 2020-03-17 DIAGNOSIS — Z23 Encounter for immunization: Secondary | ICD-10-CM

## 2020-03-17 DIAGNOSIS — N179 Acute kidney failure, unspecified: Secondary | ICD-10-CM | POA: Diagnosis present

## 2020-03-17 DIAGNOSIS — E669 Obesity, unspecified: Secondary | ICD-10-CM | POA: Diagnosis present

## 2020-03-17 DIAGNOSIS — L97519 Non-pressure chronic ulcer of other part of right foot with unspecified severity: Secondary | ICD-10-CM | POA: Diagnosis present

## 2020-03-17 DIAGNOSIS — E1065 Type 1 diabetes mellitus with hyperglycemia: Secondary | ICD-10-CM | POA: Diagnosis present

## 2020-03-17 DIAGNOSIS — E860 Dehydration: Secondary | ICD-10-CM | POA: Diagnosis present

## 2020-03-17 DIAGNOSIS — D500 Iron deficiency anemia secondary to blood loss (chronic): Secondary | ICD-10-CM | POA: Diagnosis present

## 2020-03-17 DIAGNOSIS — Z794 Long term (current) use of insulin: Secondary | ICD-10-CM

## 2020-03-17 DIAGNOSIS — Z6831 Body mass index (BMI) 31.0-31.9, adult: Secondary | ICD-10-CM

## 2020-03-17 HISTORY — DX: Disorder of kidney and ureter, unspecified: N28.9

## 2020-03-17 LAB — CBC WITH DIFFERENTIAL/PLATELET
Abs Immature Granulocytes: 0.06 10*3/uL (ref 0.00–0.07)
Basophils Absolute: 0 10*3/uL (ref 0.0–0.1)
Basophils Relative: 0 %
Eosinophils Absolute: 0.2 10*3/uL (ref 0.0–0.5)
Eosinophils Relative: 2 %
HCT: 27.6 % — ABNORMAL LOW (ref 36.0–46.0)
Hemoglobin: 8.9 g/dL — ABNORMAL LOW (ref 12.0–15.0)
Immature Granulocytes: 1 %
Lymphocytes Relative: 13 %
Lymphs Abs: 1.5 10*3/uL (ref 0.7–4.0)
MCH: 28.5 pg (ref 26.0–34.0)
MCHC: 32.2 g/dL (ref 30.0–36.0)
MCV: 88.5 fL (ref 80.0–100.0)
Monocytes Absolute: 1 10*3/uL (ref 0.1–1.0)
Monocytes Relative: 9 %
Neutro Abs: 8.4 10*3/uL — ABNORMAL HIGH (ref 1.7–7.7)
Neutrophils Relative %: 75 %
Platelets: 292 10*3/uL (ref 150–400)
RBC: 3.12 MIL/uL — ABNORMAL LOW (ref 3.87–5.11)
RDW: 14.7 % (ref 11.5–15.5)
WBC: 11.2 10*3/uL — ABNORMAL HIGH (ref 4.0–10.5)
nRBC: 0 % (ref 0.0–0.2)

## 2020-03-17 LAB — BASIC METABOLIC PANEL
Anion gap: 11 (ref 5–15)
BUN: 25 mg/dL — ABNORMAL HIGH (ref 6–20)
CO2: 26 mmol/L (ref 22–32)
Calcium: 8.4 mg/dL — ABNORMAL LOW (ref 8.9–10.3)
Chloride: 92 mmol/L — ABNORMAL LOW (ref 98–111)
Creatinine, Ser: 1.53 mg/dL — ABNORMAL HIGH (ref 0.44–1.00)
GFR calc Af Amer: 47 mL/min — ABNORMAL LOW (ref >60)
GFR calc non Af Amer: 41 mL/min — ABNORMAL LOW (ref >60)
Glucose, Bld: 351 mg/dL — ABNORMAL HIGH (ref 70–99)
Potassium: 3.4 mmol/L — ABNORMAL LOW (ref 3.5–5.1)
Sodium: 129 mmol/L — ABNORMAL LOW (ref 135–145)

## 2020-03-17 LAB — SARS CORONAVIRUS 2 BY RT PCR (HOSPITAL ORDER, PERFORMED IN ~~LOC~~ HOSPITAL LAB): SARS Coronavirus 2: NEGATIVE

## 2020-03-17 LAB — GLUCOSE, CAPILLARY
Glucose-Capillary: 147 mg/dL — ABNORMAL HIGH (ref 70–99)
Glucose-Capillary: 98 mg/dL (ref 70–99)
Glucose-Capillary: 119 mg/dL — ABNORMAL HIGH (ref 70–99)

## 2020-03-17 LAB — MRSA PCR SCREENING: MRSA by PCR: NEGATIVE

## 2020-03-17 LAB — PROTIME-INR
INR: 1 (ref 0.8–1.2)
Prothrombin Time: 12.5 seconds (ref 11.4–15.2)

## 2020-03-17 LAB — CBG MONITORING, ED: Glucose-Capillary: 208 mg/dL — ABNORMAL HIGH (ref 70–99)

## 2020-03-17 MED ORDER — INSULIN ASPART 100 UNIT/ML ~~LOC~~ SOLN
0.0000 [IU] | Freq: Three times a day (TID) | SUBCUTANEOUS | Status: DC
  Administered 2020-03-18: 3 [IU] via SUBCUTANEOUS
  Administered 2020-03-19 (×2): 2 [IU] via SUBCUTANEOUS

## 2020-03-17 MED ORDER — SODIUM CHLORIDE 0.9 % IV SOLN: INTRAVENOUS | Status: DC | PRN

## 2020-03-17 MED ORDER — INSULIN GLARGINE 100 UNIT/ML ~~LOC~~ SOLN
10.0000 [IU] | Freq: Every day | SUBCUTANEOUS | Status: DC
  Administered 2020-03-17 – 2020-03-18 (×2): 10 [IU] via SUBCUTANEOUS
  Filled 2020-03-17 (×3): qty 0.1

## 2020-03-17 MED ORDER — ACETAMINOPHEN 650 MG RE SUPP: 650.0000 mg | Freq: Four times a day (QID) | RECTAL | Status: DC | PRN

## 2020-03-17 MED ORDER — ACETAMINOPHEN 500 MG PO TABS
1000.0000 mg | ORAL_TABLET | Freq: Once | ORAL | Status: AC
  Administered 2020-03-17: 1000 mg via ORAL
  Filled 2020-03-17: qty 2

## 2020-03-17 MED ORDER — ENOXAPARIN SODIUM 40 MG/0.4ML ~~LOC~~ SOLN
40.0000 mg | Freq: Every day | SUBCUTANEOUS | Status: DC
  Administered 2020-03-18: 40 mg via SUBCUTANEOUS
  Filled 2020-03-17 (×2): qty 0.4

## 2020-03-17 MED ORDER — INSULIN ASPART 100 UNIT/ML ~~LOC~~ SOLN: 0.0000 [IU] | Freq: Every day | SUBCUTANEOUS | Status: DC

## 2020-03-17 MED ORDER — SODIUM CHLORIDE 0.9 % IV SOLN: INTRAVENOUS | Status: AC

## 2020-03-17 MED ORDER — VANCOMYCIN HCL IN DEXTROSE 1-5 GM/200ML-% IV SOLN
1000.0000 mg | Freq: Once | INTRAVENOUS | Status: AC
  Administered 2020-03-17: 1000 mg via INTRAVENOUS
  Filled 2020-03-17: qty 200

## 2020-03-17 MED ORDER — CEFAZOLIN SODIUM-DEXTROSE 2-4 GM/100ML-% IV SOLN
2.0000 g | Freq: Three times a day (TID) | INTRAVENOUS | Status: DC
  Administered 2020-03-17 – 2020-03-19 (×6): 2 g via INTRAVENOUS
  Filled 2020-03-17 (×9): qty 100

## 2020-03-17 MED ORDER — PIPERACILLIN-TAZOBACTAM 3.375 G IVPB 30 MIN
3.3750 g | Freq: Once | INTRAVENOUS | Status: AC
  Administered 2020-03-17: 3.375 g via INTRAVENOUS
  Filled 2020-03-17 (×2): qty 50

## 2020-03-17 MED ORDER — TETANUS-DIPHTH-ACELL PERTUSSIS 5-2.5-18.5 LF-MCG/0.5 IM SUSP
0.5000 mL | Freq: Once | INTRAMUSCULAR | Status: AC
  Administered 2020-03-17: 0.5 mL via INTRAMUSCULAR
  Filled 2020-03-17: qty 0.5

## 2020-03-17 MED ORDER — PANTOPRAZOLE SODIUM 40 MG PO TBEC
40.0000 mg | DELAYED_RELEASE_TABLET | Freq: Two times a day (BID) | ORAL | Status: DC
  Administered 2020-03-17 – 2020-03-19 (×4): 40 mg via ORAL
  Filled 2020-03-17 (×4): qty 1

## 2020-03-17 MED ORDER — ENOXAPARIN SODIUM 40 MG/0.4ML ~~LOC~~ SOLN: 40.0000 mg | SUBCUTANEOUS | Status: DC

## 2020-03-17 MED ORDER — VANCOMYCIN HCL 1500 MG/300ML IV SOLN
1500.0000 mg | INTRAVENOUS | Status: DC
  Administered 2020-03-18: 1500 mg via INTRAVENOUS
  Filled 2020-03-17: qty 300

## 2020-03-17 MED ORDER — SODIUM CHLORIDE 0.9 % IV SOLN: INTRAVENOUS | Status: DC

## 2020-03-17 MED ORDER — ONDANSETRON HCL 4 MG/2ML IJ SOLN
4.0000 mg | Freq: Four times a day (QID) | INTRAMUSCULAR | Status: DC | PRN
  Administered 2020-03-17 – 2020-03-18 (×4): 4 mg via INTRAVENOUS
  Filled 2020-03-17 (×4): qty 2

## 2020-03-17 MED ORDER — BACITRACIN-NEOMYCIN-POLYMYXIN OINTMENT TUBE
TOPICAL_OINTMENT | Freq: Two times a day (BID) | CUTANEOUS | Status: DC
  Administered 2020-03-18: 1 via TOPICAL
  Filled 2020-03-17: qty 14

## 2020-03-17 MED ORDER — SODIUM CHLORIDE 0.9 % IV BOLUS
500.0000 mL | Freq: Once | INTRAVENOUS | Status: AC
  Administered 2020-03-17: 500 mL via INTRAVENOUS

## 2020-03-17 MED ORDER — ENOXAPARIN SODIUM 100 MG/ML ~~LOC~~ SOLN
90.0000 mg | Freq: Once | SUBCUTANEOUS | Status: AC
  Administered 2020-03-17: 90 mg via SUBCUTANEOUS
  Filled 2020-03-17: qty 1

## 2020-03-17 MED ORDER — POTASSIUM CHLORIDE CRYS ER 20 MEQ PO TBCR
40.0000 meq | EXTENDED_RELEASE_TABLET | Freq: Once | ORAL | Status: AC
  Administered 2020-03-17: 40 meq via ORAL
  Filled 2020-03-17: qty 2

## 2020-03-17 MED ORDER — METOCLOPRAMIDE HCL 5 MG/ML IJ SOLN
5.0000 mg | Freq: Once | INTRAMUSCULAR | Status: AC
  Administered 2020-03-17: 5 mg via INTRAVENOUS
  Filled 2020-03-17: qty 2

## 2020-03-17 MED ORDER — OXYCODONE HCL 5 MG PO TABS
5.0000 mg | ORAL_TABLET | ORAL | Status: DC | PRN
  Administered 2020-03-17 – 2020-03-18 (×5): 5 mg via ORAL
  Filled 2020-03-17 (×5): qty 1

## 2020-03-17 MED ORDER — ACETAMINOPHEN 325 MG PO TABS: 650.0000 mg | ORAL_TABLET | Freq: Four times a day (QID) | ORAL | Status: DC | PRN

## 2020-03-17 NOTE — ED Notes (Signed)
Attempted IV in right AC unsuccessful.  

## 2020-03-17 NOTE — ED Notes (Signed)
Attempted to call report to [phone]; this RN contact info provided for call back

## 2020-03-17 NOTE — ED Triage Notes (Addendum)
Pt co /right leg swelling and redness from ankle to thigh  x 6 days

## 2020-03-17 NOTE — ED Provider Notes (Signed)
[facility] EMERGENCY DEPARTMENT Provider Note   CSN: [id number] Arrival date & time: 03/17/20  0021     History Chief Complaint  Patient presents with  . Leg Swelling    Abigail Moran is a 44 y.o. female.  The history is provided by the patient.  Illness Location:  Right foot and leg  Quality:  Swelling and redness Severity:  Moderate Onset quality:  Gradual Duration:  6 days Timing:  Constant Progression:  Worsening Chronicity:  New Context:  Poorly controlled diabetes  Relieved by:  Nothing  Worsened by:  Time  Ineffective treatments:  None tried  Associated symptoms: no abdominal pain, no chest pain, no congestion, no cough, no diarrhea, no ear pain, no fatigue, no headaches, no loss of consciousness, no myalgias, no nausea, no shortness of breath, no sore throat, no vomiting and no wheezing   Risk factors:  Poorly controlled diabetes       Past Medical History:  Diagnosis Date  . Allergy   . Diabetes mellitus without complication (HCC)   . Heart attack (HCC) 05/04/15  . History of DVT (deep vein thrombosis)    following hospitalization 2020  . History of kidney stones   . Vitamin D deficiency 09/14/2014    Patient Active Problem List   Diagnosis Date Noted  . Gastroparesis 08/08/2016  . Carpal tunnel syndrome 08/08/2016  . CAD (coronary artery disease) 06/07/2015  . CHF (congestive heart failure) (HCC) 06/07/2015  . Anemia 10/11/2014  . Preventative health care 09/27/2014  . Vitamin D deficiency 09/14/2014  . Edema 09/13/2014  . Type 1 diabetes mellitus, uncontrolled (HCC) 10/06/2013    Past Surgical History:  Procedure Laterality Date  . CARDIAC CATHETERIZATION  2016  . CHOLECYSTECTOMY  2001     OB History   No obstetric history on file.     Family History  Problem Relation Age of Onset  . Diabetes Father   . Hypertension Father   . Hyperlipidemia Father   . Cancer Maternal Grandfather        liver and ? colon  . Kidney  disease Neg Hx   . Heart disease Neg Hx     Social History   Tobacco Use  . Smoking status: Never Smoker  . Smokeless tobacco: Never Used  Substance Use Topics  . Alcohol use: Yes    Alcohol/week: 0.0 standard drinks    Comment: social  . Drug use: Yes    Comment: occasional marijuana  for nausea    Home Medications Prior to Admission medications   Medication Sig Start Date End Date Taking? Authorizing Provider  aspirin EC 81 MG tablet Take 81 mg by mouth daily.    [provider]  atorvastatin (LIPITOR) 20 MG tablet Take 1 tablet (20 mg total) by mouth daily. Reported on 12/12/2015 12/22/19   Sandford Craze, NP  enalapril (VASOTEC) 2.5 MG tablet Take 1 tablet (2.5 mg total) by mouth daily. 12/24/19   Sandford Craze, NP  Ferrous Sulfate (IRON) 325 (65 Fe) MG TABS Take 1 tablet (325 mg total) by mouth in the morning and at bedtime. 02/02/20   Sandford Craze, NP  glucose blood (FREESTYLE LITE) test strip Use 3x a day 10/06/13   Carlus Pavlov, MD  insulin aspart (NOVOLOG) 100 UNIT/ML injection Inject 2-15 Units into the skin 3 (three) times daily before meals. 10/02/16   Sandford Craze, NP  insulin NPH Human (NOVOLIN N) 100 UNIT/ML injection 20 UNITS IN THE MORNING AND 15 UNITS IN THE  EVENING WITH FOOD 10/02/18   [provider]  Insulin Pen Needle 31G X 8 MM MISC Use as directed 09/13/14   Sandford Craze, NP  insulin regular (NOVOLIN R) 100 units/mL injection Take 3 times daily before meals per sliding scale 01/22/20   Sandford Craze, NP  Lancets (FREESTYLE) lancets Use 4x a day 10/06/13   Carlus Pavlov, MD  Multiple Vitamin (MULTIVITAMIN) tablet Take 1 tablet by mouth daily. Reported on 12/12/2015    [provider]    Allergies    Latex and Tape  Review of Systems   Review of Systems  Constitutional: Negative for fatigue.  HENT: Negative for congestion, ear pain and sore throat.   Eyes: Negative for visual disturbance.    Respiratory: Negative for cough, shortness of breath and wheezing.   Cardiovascular: Positive for leg swelling. Negative for chest pain.  Gastrointestinal: Negative for abdominal pain, diarrhea, nausea and vomiting.  Genitourinary: Negative for difficulty urinating.  Musculoskeletal: Negative for myalgias.  Skin: Positive for color change.  Neurological: Negative for loss of consciousness and headaches.  Psychiatric/Behavioral: Negative for agitation.  All other systems reviewed and are negative.   Physical Exam Updated Vital Signs BP (!) 115/102   Pulse (!) 103   Temp 99.5 F (37.5 C) (Oral)   Resp 18   Ht 5' 5.5" (1.664 m)   Wt 86.2 kg   LMP 03/10/2020   SpO2 100%   BMI 31.14 kg/m   Physical Exam Vitals and nursing note reviewed.  Constitutional:      General: She is not in acute distress.    Appearance: Normal appearance.  HENT:     Head: Normocephalic and atraumatic.     Nose: Nose normal.  Eyes:     Conjunctiva/sclera: Conjunctivae normal.     Pupils: Pupils are equal, round, and reactive to light.  Cardiovascular:     Rate and Rhythm: Normal rate and regular rhythm.     Pulses: Normal pulses.     Heart sounds: Normal heart sounds.  Pulmonary:     Effort: Pulmonary effort is normal.     Breath sounds: Normal breath sounds.  Abdominal:     General: Abdomen is flat. Bowel sounds are normal.     Palpations: Abdomen is soft.     Tenderness: There is no abdominal tenderness. There is no guarding.  Musculoskeletal:        General: No deformity.     Cervical back: Normal range of motion and neck supple.  Skin:    General: Skin is warm and dry.     Capillary Refill: Capillary refill takes less than 2 seconds.     Findings: Erythema present.       Neurological:     General: No focal deficit present.     Mental Status: She is alert and oriented to person, place, and time.     Deep Tendon Reflexes: Reflexes normal.  Psychiatric:        Mood and Affect: Mood  normal.        Behavior: Behavior normal.     ED Results / Procedures / Treatments   Labs (all labs ordered are listed, but only abnormal results are displayed) Results for orders placed or performed during the hospital encounter of 03/17/20  CBC with Differential/Platelet  Result Value Ref Range   WBC 11.2 (H) 4.0 - 10.5 K/uL   RBC 3.12 (L) 3.87 - 5.11 MIL/uL   Hemoglobin 8.9 (L) 12.0 - 15.0 g/dL   HCT  27.6 (L) 36 - 46 %   MCV 88.5 80.0 - 100.0 fL   MCH 28.5 26.0 - 34.0 pg   MCHC 32.2 30.0 - 36.0 g/dL   RDW 40.9 81.1 - 91.4 %   Platelets 292 150 - 400 K/uL   nRBC 0.0 0.0 - 0.2 %   Neutrophils Relative % 75 %   Neutro Abs 8.4 (H) 1.7 - 7.7 K/uL   Lymphocytes Relative 13 %   Lymphs Abs 1.5 0.7 - 4.0 K/uL   Monocytes Relative 9 %   Monocytes Absolute 1.0 0 - 1 K/uL   Eosinophils Relative 2 %   Eosinophils Absolute 0.2 0 - 0 K/uL   Basophils Relative 0 %   Basophils Absolute 0.0 0 - 0 K/uL   Immature Granulocytes 1 %   Abs Immature Granulocytes 0.06 0.00 - 0.07 K/uL  Basic metabolic panel  Result Value Ref Range   Sodium 129 (L) 135 - 145 mmol/L   Potassium 3.4 (L) 3.5 - 5.1 mmol/L   Chloride 92 (L) 98 - 111 mmol/L   CO2 26 22 - 32 mmol/L   Glucose, Bld 351 (H) 70 - 99 mg/dL   BUN 25 (H) 6 - 20 mg/dL   Creatinine, Ser 7.82 (H) 0.44 - 1.00 mg/dL   Calcium 8.4 (L) 8.9 - 10.3 mg/dL   GFR calc non Af Amer 41 (L) >60 mL/min   GFR calc Af Amer 47 (L) >60 mL/min   Anion gap 11 5 - 15  Protime-INR  Result Value Ref Range   Prothrombin Time 12.5 11.4 - 15.2 seconds   INR 1.0 0.8 - 1.2   DG Tibia/Fibula Right  Result Date: 03/17/2020 CLINICAL DATA:  Right leg swelling and redness from ankle to the thigh for 6 days. EXAM: RIGHT TIBIA AND FIBULA - 2 VIEW COMPARISON:  None. FINDINGS: Generalized subcutaneous reticulation. There are a few dystrophic soft tissue calcifications at the lower shin. No evidence of erosion, fracture, or subluxation. The soft tissue gas or opaque foreign  body. IMPRESSION: Soft tissue swelling without gas or bony erosion. Electronically Signed   By: Marnee Spring M.D.   On: 03/17/2020 04:14   DG Foot Complete Right  Result Date: 03/17/2020 CLINICAL DATA:  Right leg swelling and redness from the ankle to thigh for 6 days EXAM: RIGHT FOOT COMPLETE - 3+ VIEW COMPARISON:  None. FINDINGS: Subcutaneous reticulation best seen about the ankle and dorsal foot. No evidence of fracture, subluxation, or erosion. Hallux valgus with bunion. IMPRESSION: Soft tissue swelling without erosion or soft tissue gas. Electronically Signed   By: Marnee Spring M.D.   On: 03/17/2020 04:13    Radiology DG Tibia/Fibula Right  Result Date: 03/17/2020 CLINICAL DATA:  Right leg swelling and redness from ankle to the thigh for 6 days. EXAM: RIGHT TIBIA AND FIBULA - 2 VIEW COMPARISON:  None. FINDINGS: Generalized subcutaneous reticulation. There are a few dystrophic soft tissue calcifications at the lower shin. No evidence of erosion, fracture, or subluxation. The soft tissue gas or opaque foreign body. IMPRESSION: Soft tissue swelling without gas or bony erosion. Electronically Signed   By: Marnee Spring M.D.   On: 03/17/2020 04:14   DG Foot Complete Right  Result Date: 03/17/2020 CLINICAL DATA:  Right leg swelling and redness from the ankle to thigh for 6 days EXAM: RIGHT FOOT COMPLETE - 3+ VIEW COMPARISON:  None. FINDINGS: Subcutaneous reticulation best seen about the ankle and dorsal foot. No evidence of fracture, subluxation, or erosion.  Hallux valgus with bunion. IMPRESSION: Soft tissue swelling without erosion or soft tissue gas. Electronically Signed   By: Marnee Spring M.D.   On: 03/17/2020 04:13    Procedures Procedures (including critical care time)  Medications Ordered in ED Medications  vancomycin (VANCOCIN) IVPB 1000 mg/200 mL premix (1,000 mg Intravenous New Bag/Given 03/17/20 0434)  0.9 %  sodium chloride infusion ( Intravenous New Bag/Given 03/17/20 0358)    0.9 %  sodium chloride infusion (has no administration in time range)  piperacillin-tazobactam (ZOSYN) IVPB 3.375 g ( Intravenous Stopped 03/17/20 0429)  acetaminophen (TYLENOL) tablet 1,000 mg (1,000 mg Oral Given 03/17/20 0340)  Tdap (BOOSTRIX) injection 0.5 mL (0.5 mLs Intramuscular Given 03/17/20 0340)    ED Course  I have reviewed the triage vital signs and the nursing notes.  Pertinent labs & imaging results that were available during my care of the patient were reviewed by me and considered in my medical decision making (see chart for details).    Infection likely entered through wounds on toes and has spread.  Will need doppler for DVT given history but will cover in the interim.    Abigail Moran was evaluated in Emergency Department on 03/17/2020 for the symptoms described in the history of present illness. She was evaluated in the context of the global COVID-19 pandemic, which necessitated consideration that the patient might be at risk for infection with the SARS-CoV-2 virus that causes COVID-19. Institutional protocols and algorithms that pertain to the evaluation of patients at risk for COVID-19 are in a state of rapid change based on information released by regulatory bodies including the CDC and federal and state organizations. These policies and algorithms were followed during the patient's care in the ED.  Final Clinical Impression(s) / ED Diagnoses Final diagnoses:  Cellulitis of right lower extremity  Renal insufficiency    Admit to medicine    Amariona Rathje, MD 03/17/20 [phone]

## 2020-03-17 NOTE — ED Notes (Signed)
Only 1 set of blood cultures obtained due to pt having poor venous access.

## 2020-03-17 NOTE — Progress Notes (Addendum)
Pharmacy Antibiotic Note  Abigail Moran is a 44 y.o. female admitted on 03/17/2020 with leg swelling.  Pharmacy has been consulted for vancomycin and Ancef dosing for RLE cellulitis.  Imaging negative for erosion and gas.  Noted patient received vancomycin 1gm IV and Zosyn 3.375gm IV around 0400 today.  SCr 1.53, CrCL 51 ml/min, afebrile, WBC 11.2.  Plan: Vanc 1500mg  IV Q24H for vanc trough 10-15 mcg/mL Ancef 2gm IV Q8H Monitor renal fxn, clinical progress, vanc trough if indicated  Height: 5' 5.5" (166.4 cm) Weight: 86.2 kg (190 lb) IBW/kg (Calculated) : 58.15  Temp (24hrs), Avg:99.2 F (37.3 C), Min:98.8 F (37.1 C), Max:99.5 F (37.5 C)  Recent Labs  Lab 03/17/20 0335  WBC 11.2*  CREATININE 1.53*    Estimated Creatinine Clearance: 50.9 mL/min (A) (by C-G formula based on SCr of 1.53 mg/dL (H)).    Allergies  Allergen Reactions  . Latex Itching  . Tape Hives    Vanc 9/9 >> Ancef 9/9 >>  Zosyn x1 9/9   9/9 MRSA PCR -  9/9 BCx -   Ellionna Buckbee D. 08-15-1983, PharmD, BCPS, BCCCP 03/17/2020, 2:02 PM

## 2020-03-17 NOTE — H&P (Signed)
History and Physical    Abigail Moran JXB:[id number] DOB: January 13, 1976 DOA: 03/17/2020  PCP: Sandford Craze, NP (Confirm with patient/family/NH records and if not entered, this has to be entered at [facility] point of entry) Patient coming from: Home  I have personally briefly reviewed patient's old medical records in [facility]  Chief Complaint: Left leg and foot pain  HPI: Abigail Moran is a 44 y.o. female with medical history significant of IDDM, DM neuropathy, hypertension, HLD, iron deficiency anemia secondary to menorrhagia, presented with new onset of right leg rash swelling and pain.  As patient remembers, 7 days ago, she was more apparent on tight sneaker which might caused 2 ulcers on the right second and third toes.  She has diabetic neuropathy and that the results she noticed the wounds probably 1 day later.  She started to apply OTC antibiotic ointment for 2-3 days, however there was a rash and swelling develop for last 2 days extended to the level of right knee with significant pain affect her walking, she also noticed some clear discharge from the 2 shallow ulcers of the right toes, not foul smelling.  Denies any fever chills. ED Course: WBC 11.2, glucose 351, hemoglobin 8.9.  X-ray of the right foot and tibia-fibula showed soft tissue swelling without bony erosion or soft tissue gas.  Review of Systems: As per HPI otherwise 14 point review of systems negative.    Past Medical History:  Diagnosis Date  . Allergy   . Diabetes mellitus without complication (HCC)   . Heart attack (HCC) 05/04/15  . History of DVT (deep vein thrombosis)    following hospitalization 2020  . History of kidney stones   . Renal disorder   . Vitamin D deficiency 09/14/2014    Past Surgical History:  Procedure Laterality Date  . CARDIAC CATHETERIZATION  2016  . CHOLECYSTECTOMY  2001     reports that she has never smoked. She has never used smokeless tobacco. She reports current alcohol use. She  reports current drug use.  Allergies  Allergen Reactions  . Latex Itching  . Tape Hives    Family History  Problem Relation Age of Onset  . Diabetes Father   . Hypertension Father   . Hyperlipidemia Father   . Cancer Maternal Grandfather        liver and ? colon  . Kidney disease Neg Hx   . Heart disease Neg Hx      Prior to Admission medications   Medication Sig Start Date End Date Taking? Authorizing Provider  aspirin EC 81 MG tablet Take 81 mg by mouth daily.   Yes [provider]  Ferrous Sulfate (IRON) 325 (65 Fe) MG TABS Take 1 tablet (325 mg total) by mouth in the morning and at bedtime. 02/02/20  Yes Sandford Craze, NP  glucose blood (FREESTYLE LITE) test strip Use 3x a day 10/06/13  Yes Carlus Pavlov, MD  insulin NPH Human (NOVOLIN N) 100 UNIT/ML injection Inject 15-20 Units into the skin See admin instructions. 20 units in the morning and 15 units in the evening with food 10/02/18  Yes [provider]  Insulin Pen Needle 31G X 8 MM MISC Use as directed 09/13/14  Yes Sandford Craze, NP  insulin regular (NOVOLIN R) 100 units/mL injection Take 3 times daily before meals per sliding scale Patient taking differently: Inject 3-6 Units into the skin 3 (three) times daily with meals. Take 3 times daily before meals per sliding scale 01/22/20  Yes Peggyann Juba,  Melissa, NP  Lancets (FREESTYLE) lancets Use 4x a day 10/06/13  Yes Carlus Pavlov, MD  Multiple Vitamin (MULTIVITAMIN) tablet Take 1 tablet by mouth daily. Reported on 12/12/2015   Yes [provider]  atorvastatin (LIPITOR) 20 MG tablet Take 1 tablet (20 mg total) by mouth daily. Reported on 12/12/2015 Patient not taking: Reported on 03/17/2020 12/22/19   Sandford Craze, NP  enalapril (VASOTEC) 2.5 MG tablet Take 1 tablet (2.5 mg total) by mouth daily. Patient not taking: Reported on 03/17/2020 12/24/19   Sandford Craze, NP  insulin aspart (NOVOLOG) 100 UNIT/ML injection Inject 2-15  Units into the skin 3 (three) times daily before meals. Patient not taking: Reported on 03/17/2020 10/02/16   Sandford Craze, NP    Physical Exam: Vitals:   03/17/20 0800 03/17/20 0900 03/17/20 1222 03/17/20 1335  BP: 118/89 110/80 108/77 (!) 121/96  Pulse: (!) 103 93 97 97  Resp: 14  18   Temp:    98.8 F (37.1 C)  TempSrc:    Oral  SpO2: 98% 99% 100% 100%  Weight:      Height:        Constitutional: NAD, calm, comfortable Vitals:   03/17/20 0800 03/17/20 0900 03/17/20 1222 03/17/20 1335  BP: 118/89 110/80 108/77 (!) 121/96  Pulse: (!) 103 93 97 97  Resp: 14  18   Temp:    98.8 F (37.1 C)  TempSrc:    Oral  SpO2: 98% 99% 100% 100%  Weight:      Height:       Eyes: PERRL, lids and conjunctivae normal ENMT: Mucous membranes are dry. Posterior pharynx clear of any exudate or lesions.Normal dentition.  Neck: normal, supple, no masses, no thyromegaly Respiratory: clear to auscultation bilaterally, no wheezing, no crackles. Normal respiratory effort. No accessory muscle use.  Cardiovascular: Regular rate and rhythm, no murmurs / rubs / gallops. No extremity edema. 2+ pedal pulses. No carotid bruits.  Abdomen: no tenderness, no masses palpated. No hepatosplenomegaly. Bowel sounds positive.  Musculoskeletal: no clubbing / cyanosis. No joint deformity upper and lower extremities. Good ROM, no contractures. Normal muscle tone.  Skin: Right leg below the knee swelling rash, more than 1/3 of the right leg surface area, warm and tender to touch, two small shallow ulcers on the right 2nd and 3rd toes.  Clear and thin discharges coming out of ulcers. Neurologic: CN 2-12 grossly intact. Sensation intact, DTR normal. Strength 5/5 in all 4.  Psychiatric: Normal judgment and insight. Alert and oriented x 3. Normal mood.     Labs on Admission: I have personally reviewed following labs and imaging studies  CBC: Recent Labs  Lab 03/17/20 0335  WBC 11.2*  NEUTROABS 8.4*  HGB 8.9*    HCT 27.6*  MCV 88.5  PLT 292   Basic Metabolic Panel: Recent Labs  Lab 03/17/20 0335  NA 129*  K 3.4*  CL 92*  CO2 26  GLUCOSE 351*  BUN 25*  CREATININE 1.53*  CALCIUM 8.4*   GFR: Estimated Creatinine Clearance: 50.9 mL/min (A) (by C-G formula based on SCr of 1.53 mg/dL (H)). Liver Function Tests: No results for input(s): AST, ALT, ALKPHOS, BILITOT, PROT, ALBUMIN in the last 168 hours. No results for input(s): LIPASE, AMYLASE in the last 168 hours. No results for input(s): AMMONIA in the last 168 hours. Coagulation Profile: Recent Labs  Lab 03/17/20 0335  INR 1.0   Cardiac Enzymes: No results for input(s): CKTOTAL, CKMB, CKMBINDEX, TROPONINI in the last 168 hours. BNP (  last 3 results) No results for input(s): PROBNP in the last 8760 hours. HbA1C: No results for input(s): HGBA1C in the last 72 hours. CBG: Recent Labs  Lab 03/17/20 0752 03/17/20 1333  GLUCAP 208* 147*   Lipid Profile: No results for input(s): CHOL, HDL, LDLCALC, TRIG, CHOLHDL, LDLDIRECT in the last 72 hours. Thyroid Function Tests: No results for input(s): TSH, T4TOTAL, FREET4, T3FREE, THYROIDAB in the last 72 hours. Anemia Panel: No results for input(s): VITAMINB12, FOLATE, FERRITIN, TIBC, IRON, RETICCTPCT in the last 72 hours. Urine analysis:    Component Value Date/Time   COLORURINE YELLOW 08/08/2016 0813   APPEARANCEUR CLEAR 08/08/2016 0813   LABSPEC 1.010 08/08/2016 0813   PHURINE 6.5 08/08/2016 0813   GLUCOSEU >=1000 (A) 08/08/2016 0813   HGBUR SMALL (A) 08/08/2016 0813   BILIRUBINUR NEGATIVE 08/08/2016 0813   KETONESUR NEGATIVE 08/08/2016 0813   PROTEINUR NEGATIVE 12/12/2015 2026   UROBILINOGEN 0.2 08/08/2016 0813   NITRITE NEGATIVE 08/08/2016 0813   LEUKOCYTESUR NEGATIVE 08/08/2016 0813    Radiological Exams on Admission: DG Tibia/Fibula Right  Result Date: 03/17/2020 CLINICAL DATA:  Right leg swelling and redness from ankle to the thigh for 6 days. EXAM: RIGHT TIBIA AND  FIBULA - 2 VIEW COMPARISON:  None. FINDINGS: Generalized subcutaneous reticulation. There are a few dystrophic soft tissue calcifications at the lower shin. No evidence of erosion, fracture, or subluxation. The soft tissue gas or opaque foreign body. IMPRESSION: Soft tissue swelling without gas or bony erosion. Electronically Signed   By: Marnee Spring M.D.   On: 03/17/2020 04:14   DG Foot Complete Right  Result Date: 03/17/2020 CLINICAL DATA:  Right leg swelling and redness from the ankle to thigh for 6 days EXAM: RIGHT FOOT COMPLETE - 3+ VIEW COMPARISON:  None. FINDINGS: Subcutaneous reticulation best seen about the ankle and dorsal foot. No evidence of fracture, subluxation, or erosion. Hallux valgus with bunion. IMPRESSION: Soft tissue swelling without erosion or soft tissue gas. Electronically Signed   By: Marnee Spring M.D.   On: 03/17/2020 04:13    EKG: None  Assessment/Plan Active Problems:   Cellulitis of right lower extremity   Cellulitis of leg, right  (please populate well all problems here in Problem List. (For example, if patient is on BP meds at home and you resume or decide to hold them, it is a problem that needs to be her. Same for CAD, COPD, HLD and so on)  Right leg/foot cellulitis -Send MRSA screen and ASO -Given the extensiveness of the cellulitis with involvement more than 1/3 of right leg surface area, and purulent discharge, will maintain patient on IV vancomycin, switch Zosyn to Ancef to cover possible strep, low suspicion for Pseudomonas at this point.  AKI -Clinically looks dry, blood pressure borderline, will start maintenance IV fluids x1 day -Check renal ultrasound -Hold ACEI  Poorly controlled IDDM -Suspect there is an insulin resistance -Given her ACS status, will cut down long-acting insulin and increase her sliding scale  Chronic iron deficiency anemia secondary to menorrhagia -Continue iron supplement  HTN -Hold ACEI for AKI  HLD -One  statin   DVT prophylaxis: Lovenox Code Status: Full code Family Communication: None at bedside Disposition Plan: Expect more than 2 midnight hospital stay for IV antibiotics Consults called: None Admission status: MedSurg   Emeline General MD [facility] Pager [phone]  03/17/2020, 4:34 PM

## 2020-03-17 NOTE — Progress Notes (Signed)
Inpatient Diabetes Program Recommendations  AACE/ADA: New Consensus Statement on Inpatient Glycemic Control (2015)  Target Ranges:  Prepandial:   less than 140 mg/dL      Peak postprandial:   less than 180 mg/dL (1-2 hours)      Critically ill patients:  140 - 180 mg/dL   Lab Results  Component Value Date   GLUCAP 147 (H) 03/17/2020   HGBA1C 10.6 Repeated and verified X2. (H) 12/22/2019    Review of Glycemic Control Results for Abigail Moran, Abigail Moran (MRN [id number]) as of 03/17/2020 16:19  Ref. Range 03/17/2020 07:52 03/17/2020 13:33  Glucose-Capillary Latest Ref Range: 70 - 99 mg/dL 503 (H) 888 (H)   Diabetes history: DM 2, Sees Sandford Craze, NP with [facility] Outpatient Diabetes medications: NPH 20 units qam, 15 units qpm, Regular 3-6 units tid per SSI Current orders for Inpatient glycemic control:  Novolog 0-9 units tid + hs  Inpatient Diabetes Program Recommendations:    - Pending A1c, has had adjustments since last level obtained by PCP  - Consider Levemir 10 units bid.  Thanks,  Christena Deem RN, MSN, BC-ADM Inpatient Diabetes Coordinator Team Pager [phone] (8a-5p)

## 2020-03-18 ENCOUNTER — Encounter (HOSPITAL_COMMUNITY): Payer: Self-pay | Admitting: Internal Medicine

## 2020-03-18 DIAGNOSIS — L02619 Cutaneous abscess of unspecified foot: Secondary | ICD-10-CM

## 2020-03-18 HISTORY — DX: Cutaneous abscess of unspecified foot: L02.619

## 2020-03-18 LAB — BASIC METABOLIC PANEL
Anion gap: 10 (ref 5–15)
BUN: 10 mg/dL (ref 6–20)
CO2: 21 mmol/L — ABNORMAL LOW (ref 22–32)
Calcium: 8 mg/dL — ABNORMAL LOW (ref 8.9–10.3)
Chloride: 102 mmol/L (ref 98–111)
Creatinine, Ser: 1.17 mg/dL — ABNORMAL HIGH (ref 0.44–1.00)
GFR calc Af Amer: >60 mL/min (ref >60)
GFR calc non Af Amer: 56 mL/min — ABNORMAL LOW (ref >60)
Glucose, Bld: 96 mg/dL (ref 70–99)
Potassium: 3.8 mmol/L (ref 3.5–5.1)
Sodium: 133 mmol/L — ABNORMAL LOW (ref 135–145)

## 2020-03-18 LAB — CBC
HCT: 24.1 % — ABNORMAL LOW (ref 36.0–46.0)
Hemoglobin: 7.7 g/dL — ABNORMAL LOW (ref 12.0–15.0)
MCH: 28.7 pg (ref 26.0–34.0)
MCHC: 32 g/dL (ref 30.0–36.0)
MCV: 89.9 fL (ref 80.0–100.0)
Platelets: 284 10*3/uL (ref 150–400)
RBC: 2.68 MIL/uL — ABNORMAL LOW (ref 3.87–5.11)
RDW: 14.8 % (ref 11.5–15.5)
WBC: 8.1 10*3/uL (ref 4.0–10.5)
nRBC: 0 % (ref 0.0–0.2)

## 2020-03-18 LAB — HEMOGLOBIN A1C
Hgb A1c MFr Bld: 9.9 % — ABNORMAL HIGH (ref 4.8–5.6)
Mean Plasma Glucose: 237.43 mg/dL

## 2020-03-18 LAB — IRON AND TIBC
Iron: 20 ug/dL — ABNORMAL LOW (ref 28–170)
Saturation Ratios: 7 % — ABNORMAL LOW (ref 10.4–31.8)
TIBC: 297 ug/dL (ref 250–450)
UIBC: 277 ug/dL

## 2020-03-18 LAB — GLUCOSE, CAPILLARY
Glucose-Capillary: 90 mg/dL (ref 70–99)
Glucose-Capillary: 108 mg/dL — ABNORMAL HIGH (ref 70–99)
Glucose-Capillary: 210 mg/dL — ABNORMAL HIGH (ref 70–99)
Glucose-Capillary: 110 mg/dL — ABNORMAL HIGH (ref 70–99)

## 2020-03-18 LAB — FERRITIN: Ferritin: 93 ng/mL (ref 11–307)

## 2020-03-18 MED ORDER — METOCLOPRAMIDE HCL 5 MG/ML IJ SOLN
10.0000 mg | Freq: Four times a day (QID) | INTRAMUSCULAR | Status: DC | PRN
  Administered 2020-03-18: 10 mg via INTRAVENOUS
  Filled 2020-03-18: qty 2

## 2020-03-18 MED ORDER — VANCOMYCIN HCL IN DEXTROSE 1-5 GM/200ML-% IV SOLN
1000.0000 mg | Freq: Two times a day (BID) | INTRAVENOUS | Status: DC
  Filled 2020-03-18: qty 200

## 2020-03-18 MED ORDER — SODIUM CHLORIDE 0.9 % IV SOLN
510.0000 mg | Freq: Once | INTRAVENOUS | Status: AC
  Administered 2020-03-18: 510 mg via INTRAVENOUS
  Filled 2020-03-18: qty 17

## 2020-03-18 NOTE — Progress Notes (Signed)
Pharmacy Antibiotic Note  Abigail Moran is a 44 y.o. female admitted on 03/17/2020 with leg swelling with purulent discharge.  Pharmacy has been consulted for vancomycin and Ancef dosing for RLE cellulitis.  Imaging negative for erosion and gas.   Renal function improving, afebrile, WBC normalized.  Plan: Change vanc to 1gm IV Q12H for vanc trough 10-15 mcg/mL Continue Ancef 2gm IV Q8H Monitor renal fxn, clinical progress, vanc trough as indicated  Height: 5' 5.5" (166.4 cm) Weight: 86.2 kg (190 lb) IBW/kg (Calculated) : 58.15  Temp (24hrs), Avg:98.8 F (37.1 C), Min:97.9 F (36.6 C), Max:99.4 F (37.4 C)  Recent Labs  Lab 03/17/20 0335 03/18/20 0450  WBC 11.2* 8.1  CREATININE 1.53* 1.17*    Estimated Creatinine Clearance: 66.5 mL/min (A) (by C-G formula based on SCr of 1.17 mg/dL (H)).    Allergies  Allergen Reactions  . Latex Itching  . Tape Hives    Vanc 9/9 >> Ancef 9/9 >>  Zosyn x1 9/9   9/9 MRSA PCR - negative 9/9 BCx - NGTD  Hansika Leaming D. Laney Potash, PharmD, BCPS, BCCCP 03/18/2020, 11:18 AM

## 2020-03-18 NOTE — Progress Notes (Signed)
While assessing pt bowel sounds, pt said she had felt a new enlarged area in her right groin and her right inner thigh was tender.  I assessed the spot she pointed to.  There is a large marble-size lump near the pubic hair line at the crease of her right thigh.  This is on the same side as the foot that has cellulitis.  Charge Nurse, Abigail Moran, came in to assess the bump as well.  Notified physician.

## 2020-03-18 NOTE — Progress Notes (Addendum)
PROGRESS NOTE    Abigail Moran  WUX:[id number] DOB: 03/31/1976 DOA: 03/17/2020 PCP: Sandford Craze, NP   Brief Narrative:  Patient is a 44 year old female with history of insulin-dependent diabetes mellitus, diabetic neuropathy, hypertension, hyperlipidemia, iron deficiency anemia secondary to menorrhagia who presents to emergency department with complaints of initial onset of right leg rash, swelling and pain.  The rash did not improve with outpatient antibiotic ointment.  She had also noticed some discharge from 2 shallow ulcers of the right toe.  No fever or chills at home.  On presentation, imaging of the right lower extremity showed some soft tissue swelling without any bony lesion or gas.  She had mild leukocytosis, hyperglycemic.  She was admitted for the management of right lower extremity cellulitis.  Assessment & Plan:   Active Problems:   Cellulitis of right lower extremity   Cellulitis of leg, right   Renal insufficiency   Right lower extremity cellulitis: Presented with edema, pain, erythema, purulent discharge from right lower extremity wound.  She has superficial ulcerations on the first and second right toes he started on vancomycin and Ancef.  Blood cultures have been sent.  Currently she is afebrile, hemodynamically stable.  Continue ancef ,will dc vanco.  AKI: Looks dehydrated on presentation.  Started on IV fluids with improvement of renal function.  ACE inhibitor is on hold.  Poorly controlled insulin-dependent diabetes mellitus type 1: Hemoglobin A1c of 9.9.  On insulin at home and she is compliant.  Diabetic coordinator following.  Nausea: Could be associated with diabetic gastroparesis.  We will add Reglan.  We will advise her to take small volume frequent meals.  Focus on glycemic control.  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  On iron supplements.    Hemoglobin in the range of 7.Iron is just 20.  We will give her a dose of IV  iron.  Hypertension: Currently blood pressure stable.  ACE inhibitor on hold for AKI.  Hyperlipidemia: On statin.            DVT prophylaxis: Lovenox Code Status: Full Family Communication: None at the bed side Status is: Inpatient  Remains inpatient appropriate because:IV treatments appropriate due to intensity of illness or inability to take PO   Dispo: The patient is from: Home              Anticipated d/c is to: Home              Anticipated d/c date is: 1 day              Patient currently is not medically stable to d/c.    Consultants: None  Procedures:None  Antimicrobials:  Anti-infectives (From admission, onward)   Start     Dose/Rate Route Frequency Ordered Stop   03/18/20 0100  vancomycin (VANCOREADY) IVPB 1500 mg/300 mL        1,500 mg 150 mL/hr over 120 Minutes Intravenous Every 24 hours 03/17/20 1404     03/17/20 1500  ceFAZolin (ANCEF) IVPB 2g/100 mL premix        2 g 200 mL/hr over 30 Minutes Intravenous Every 8 hours 03/17/20 1406     03/17/20 0330  vancomycin (VANCOCIN) IVPB 1000 mg/200 mL premix        1,000 mg 200 mL/hr over 60 Minutes Intravenous  Once 03/17/20 0319 03/17/20 0534   03/17/20 0330  piperacillin-tazobactam (ZOSYN) IVPB 3.375 g        3.375 g 100 mL/hr over 30 Minutes Intravenous  Once  03/17/20 0319 03/17/20 0429      Subjective:  Patient seen and examined the bedside this morning.  Hemodynamically stable.  Complains of some nausea.  Right lower extremity edema, pain improving.  Objective: Vitals:   03/17/20 1753 03/17/20 2032 03/18/20 0248 03/18/20 0600  BP: 99/63 106/60 100/63 110/78  Pulse: 98 97 91 96  Resp: 16 16 16 16   Temp: 99 F (37.2 C) 99.4 F (37.4 C) 98.7 F (37.1 C) 97.9 F (36.6 C)  TempSrc:  Oral  Oral  SpO2: 98% 99% 100% 100%  Weight:      Height:        Intake/Output Summary (Last 24 hours) at 03/18/2020 0839 Last data filed at 03/18/2020 0600 Gross per 24 hour  Intake 240 ml  Output --  Net  240 ml   Filed Weights   03/17/20 0025  Weight: 86.2 kg    Examination:  General exam: Appears calm and comfortable ,Not in distress,obese HEENT:PERRL,Oral mucosa moist, Ear/Nose normal on gross exam Respiratory system: Bilateral equal air entry, normal vesicular breath sounds, no wheezes or crackles  Cardiovascular system: S1 & S2 heard, RRR. No JVD, murmurs, rubs, gallops or clicks. No pedal edema. Gastrointestinal system: Abdomen is nondistended, soft and nontender. No organomegaly or masses felt. Normal bowel sounds heard. Central nervous system: Alert and oriented. No focal neurological deficits. Extremities: right foot edema, no clubbing ,no cyanosis,  Skin: 2 small ulcers on the first and second right toes.  Data Reviewed: I have personally reviewed following labs and imaging studies  CBC: Recent Labs  Lab 03/17/20 0335 03/18/20 0450  WBC 11.2* 8.1  NEUTROABS 8.4*  --   HGB 8.9* 7.7*  HCT 27.6* 24.1*  MCV 88.5 89.9  PLT 292 284   Basic Metabolic Panel: Recent Labs  Lab 03/17/20 0335 03/18/20 0450  NA 129* 133*  K 3.4* 3.8  CL 92* 102  CO2 26 21*  GLUCOSE 351* 96  BUN 25* 10  CREATININE 1.53* 1.17*  CALCIUM 8.4* 8.0*   GFR: Estimated Creatinine Clearance: 66.5 mL/min (A) (by C-G formula based on SCr of 1.17 mg/dL (H)). Liver Function Tests: No results for input(s): AST, ALT, ALKPHOS, BILITOT, PROT, ALBUMIN in the last 168 hours. No results for input(s): LIPASE, AMYLASE in the last 168 hours. No results for input(s): AMMONIA in the last 168 hours. Coagulation Profile: Recent Labs  Lab 03/17/20 0335  INR 1.0   Cardiac Enzymes: No results for input(s): CKTOTAL, CKMB, CKMBINDEX, TROPONINI in the last 168 hours. BNP (last 3 results) No results for input(s): PROBNP in the last 8760 hours. HbA1C: Recent Labs    03/18/20 0450  HGBA1C 9.9*   CBG: Recent Labs  Lab 03/17/20 0752 03/17/20 1333 03/17/20 1652 03/17/20 2125 03/18/20 0746  GLUCAP 208*  147* 98 119* 90   Lipid Profile: No results for input(s): CHOL, HDL, LDLCALC, TRIG, CHOLHDL, LDLDIRECT in the last 72 hours. Thyroid Function Tests: No results for input(s): TSH, T4TOTAL, FREET4, T3FREE, THYROIDAB in the last 72 hours. Anemia Panel: No results for input(s): VITAMINB12, FOLATE, FERRITIN, TIBC, IRON, RETICCTPCT in the last 72 hours. Sepsis Labs: No results for input(s): PROCALCITON, LATICACIDVEN in the last 168 hours.  Recent Results (from the past 240 hour(s))  SARS Coronavirus 2 by RT PCR (hospital order, performed in [facility] hospital lab) Nasopharyngeal Nasopharyngeal Swab     Status: None   Collection Time: 03/17/20  3:35 AM   Specimen: Nasopharyngeal Swab  Result Value Ref Range Status  SARS Coronavirus 2 NEGATIVE NEGATIVE Final    Comment: (NOTE) SARS-CoV-2 target nucleic acids are NOT DETECTED.  The SARS-CoV-2 RNA is generally detectable in upper and lower respiratory specimens during the acute phase of infection. The lowest concentration of SARS-CoV-2 viral copies this assay can detect is 250 copies / mL. A negative result does not preclude SARS-CoV-2 infection and should not be used as the sole basis for treatment or other patient management decisions.  A negative result may occur with improper specimen collection / handling, submission of specimen other than nasopharyngeal swab, presence of viral mutation(s) within the areas targeted by this assay, and inadequate number of viral copies (<250 copies / mL). A negative result must be combined with clinical observations, patient history, and epidemiological information.  Fact Sheet for Patients:   BoilerBrush.com.cy  Fact Sheet for Healthcare Providers: https://pope.com/  This test is not yet approved or  cleared by the [place] FDA and has been authorized for detection and/or diagnosis of SARS-CoV-2 by FDA under an Emergency Use Authorization (EUA).   This EUA will remain in effect (meaning this test can be used) for the duration of the COVID-19 declaration under Section 564(b)(1) of the Act, 21 U.S.C. section 360bbb-3(b)(1), unless the authorization is terminated or revoked sooner.  Performed at [facility], [address] [place], [place] [address]   MRSA PCR Screening     Status: None   Collection Time: 03/17/20  2:13 PM   Specimen: Nasopharyngeal  Result Value Ref Range Status   MRSA by PCR NEGATIVE NEGATIVE Final    Comment:        The GeneXpert MRSA Assay (FDA approved for NASAL specimens only), is one component of a comprehensive MRSA colonization surveillance program. It is not intended to diagnose MRSA infection nor to guide or monitor treatment for MRSA infections. Performed at [facility], [address], [place], [place] [address]          Radiology Studies: DG Tibia/Fibula Right  Result Date: 03/17/2020 CLINICAL DATA:  Right leg swelling and redness from ankle to the thigh for 6 days. EXAM: RIGHT TIBIA AND FIBULA - 2 VIEW COMPARISON:  None. FINDINGS: Generalized subcutaneous reticulation. There are a few dystrophic soft tissue calcifications at the lower shin. No evidence of erosion, fracture, or subluxation. The soft tissue gas or opaque foreign body. IMPRESSION: Soft tissue swelling without gas or bony erosion. Electronically Signed   By: Marnee Spring M.D.   On: 03/17/2020 04:14   US RENAL  Result Date: 03/17/2020 CLINICAL DATA:  Acute kidney injury EXAM: RENAL / URINARY TRACT ULTRASOUND COMPLETE COMPARISON:  Report 10/25/2004 FINDINGS: Right Kidney: Renal measurements: 13 x 4.8 x 4.9 cm = volume: 160.2 mL. Cortical echogenicity is normal. Cyst at the midpole measuring 2.9 x 2.7 x 3.2 cm Left Kidney: Renal measurements: 11.7 x 5.1 x 5 cm = volume: 157.8 mL. Echogenicity within normal limits. No mass or hydronephrosis visualized. Bladder: Appears normal for degree of bladder distention.  Other: None. IMPRESSION: Simple appearing right renal cyst.  Otherwise negative examination Electronically Signed   By: Jasmine Pang M.D.   On: 03/17/2020 19:08   DG Foot Complete Right  Result Date: 03/17/2020 CLINICAL DATA:  Right leg swelling and redness from the ankle to thigh for 6 days EXAM: RIGHT FOOT COMPLETE - 3+ VIEW COMPARISON:  None. FINDINGS: Subcutaneous reticulation best seen about the ankle and dorsal foot. No evidence of fracture, subluxation, or erosion. Hallux valgus with  bunion. IMPRESSION: Soft tissue swelling without erosion or soft tissue gas. Electronically Signed   By: Marnee Spring M.D.   On: 03/17/2020 04:13        Scheduled Meds: . enoxaparin (LOVENOX) injection  40 mg Subcutaneous Daily  . insulin aspart  0-5 Units Subcutaneous QHS  . insulin aspart  0-9 Units Subcutaneous TID WC  . insulin glargine  10 Units Subcutaneous QHS  . neomycin-bacitracin-polymyxin   Topical BID  . pantoprazole  40 mg Oral BID AC   Continuous Infusions: . sodium chloride 125 mL/hr at 03/18/20 0103  .  ceFAZolin (ANCEF) IV 2 g (03/18/20 0514)  . vancomycin 1,500 mg (03/18/20 0110)     LOS: 1 day    Time spent: 35 mins. More than 50% of that time was spent in counseling and/or coordination of care.      Burnadette Pop, MD [facility] P9/04/2020, 8:39 AM

## 2020-03-19 LAB — CBC WITH DIFFERENTIAL/PLATELET
Abs Immature Granulocytes: 0.02 10*3/uL (ref 0.00–0.07)
Basophils Absolute: 0 10*3/uL (ref 0.0–0.1)
Basophils Relative: 1 %
Eosinophils Absolute: 0.3 10*3/uL (ref 0.0–0.5)
Eosinophils Relative: 4 %
HCT: 26 % — ABNORMAL LOW (ref 36.0–46.0)
Hemoglobin: 8.2 g/dL — ABNORMAL LOW (ref 12.0–15.0)
Immature Granulocytes: 0 %
Lymphocytes Relative: 16 %
Lymphs Abs: 1 10*3/uL (ref 0.7–4.0)
MCH: 28.3 pg (ref 26.0–34.0)
MCHC: 31.5 g/dL (ref 30.0–36.0)
MCV: 89.7 fL (ref 80.0–100.0)
Monocytes Absolute: 0.6 10*3/uL (ref 0.1–1.0)
Monocytes Relative: 8 %
Neutro Abs: 4.7 10*3/uL (ref 1.7–7.7)
Neutrophils Relative %: 71 %
Platelets: 298 10*3/uL (ref 150–400)
RBC: 2.9 MIL/uL — ABNORMAL LOW (ref 3.87–5.11)
RDW: 14.9 % (ref 11.5–15.5)
WBC: 6.5 10*3/uL (ref 4.0–10.5)
nRBC: 0 % (ref 0.0–0.2)

## 2020-03-19 LAB — BASIC METABOLIC PANEL
Anion gap: 9 (ref 5–15)
BUN: 6 mg/dL (ref 6–20)
CO2: 21 mmol/L — ABNORMAL LOW (ref 22–32)
Calcium: 8.1 mg/dL — ABNORMAL LOW (ref 8.9–10.3)
Chloride: 104 mmol/L (ref 98–111)
Creatinine, Ser: 1.12 mg/dL — ABNORMAL HIGH (ref 0.44–1.00)
GFR calc Af Amer: >60 mL/min (ref >60)
GFR calc non Af Amer: 59 mL/min — ABNORMAL LOW (ref >60)
Glucose, Bld: 226 mg/dL — ABNORMAL HIGH (ref 70–99)
Potassium: 3.8 mmol/L (ref 3.5–5.1)
Sodium: 134 mmol/L — ABNORMAL LOW (ref 135–145)

## 2020-03-19 LAB — GLUCOSE, CAPILLARY
Glucose-Capillary: 162 mg/dL — ABNORMAL HIGH (ref 70–99)
Glucose-Capillary: 179 mg/dL — ABNORMAL HIGH (ref 70–99)

## 2020-03-19 MED ORDER — DOXYCYCLINE HYCLATE 100 MG PO TBEC: 100.0000 mg | DELAYED_RELEASE_TABLET | Freq: Two times a day (BID) | ORAL | 0 refills | Status: AC

## 2020-03-19 MED ORDER — FERROUS SULFATE 325 (65 FE) MG PO TABS: 325.0000 mg | ORAL_TABLET | Freq: Every day | ORAL | Status: DC

## 2020-03-19 MED ORDER — PANTOPRAZOLE SODIUM 40 MG PO TBEC: 40.0000 mg | DELAYED_RELEASE_TABLET | Freq: Every day | ORAL | 0 refills | Status: DC

## 2020-03-19 NOTE — Progress Notes (Signed)
Refused Lovenox. Encouraged leg exercises while in bed and encouraged ambulation.

## 2020-03-19 NOTE — Discharge Summary (Signed)
Physician Discharge Summary  Abigail Moran NGE:[id number] DOB: 1975-11-14 DOA: 03/17/2020  PCP: Sandford Craze, NP  Admit date: 03/17/2020 Discharge date: 03/19/2020  Admitted From: Home Disposition:  Home  Discharge Condition:Stable CODE STATUS:FULL Diet recommendation: Carb Modified  Brief/Interim Summary:  Patient is a 44 year old female with history of insulin-dependent diabetes mellitus, diabetic neuropathy, hypertension, hyperlipidemia, iron deficiency anemia secondary to menorrhagia who presents to emergency department with complaints of initial onset of right leg rash, swelling and pain.  The rash did not improve with outpatient antibiotic ointment.  She had also noticed some discharge from 2 shallow ulcers of the right toe.  No fever or chills at home.  On presentation, imaging of the right lower extremity showed some soft tissue swelling without any bony lesion or gas.  She had mild leukocytosis, hyperglycemic.  She was admitted for the management of right lower extremity cellulitis.  She was started on broad start antibiotics.  This morning her right lower extremity cellulitis has significantly improved.  The swelling has gone down and she feels better.  She is stable for discharge to home with oral antibiotics.  Following problems were addressed during hospitalization:  Right lower extremity cellulitis: Presented with edema, pain, erythema, purulent discharge from right lower extremity wound.  She has superficial ulcerations on the first and second right toes he started on vancomycin and Ancef.  Blood cultures have been sent ,NGTD.  Currently she is afebrile, hemodynamically stable.   This morning her right lower extremity cellulitis has significantly improved.  The swelling has gone down and she feels better.  Antibiotics changed to doxycycline.  AKI: Looks dehydrated on presentation.  Started on IV fluids with improvement of renal function.  Poorly controlled insulin-dependent  diabetes mellitus type 1: Hemoglobin A1c of 9.9.  On insulin at home and she is compliant.  Diabetic coordinator was following.  Nausea: Could be associated with diabetic gastroparesis.Resolved now. We will advise her to take small volume frequent meals.  Focus on glycemic control.  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  On iron supplements.    Hemoglobin in the range of 7.Iron found to be  just 20. Given  a dose of IV iron.  Hypertension: Currently blood pressure stable.    Currently not taking any medications at home.  Hyperlipidemia: On statin.  Apparently not taking.   Discharge Diagnoses:  Active Problems:   Cellulitis of right lower extremity   Cellulitis of leg, right   Renal insufficiency    Discharge Instructions  Discharge Instructions    Diet Carb Modified   Complete by: As directed    Discharge instructions   Complete by: As directed    1) Please take prescribed medications as instructed. 2)Follow up with your PCP in a week. 3)Monitor your blood sugars at home.  Take small volume frequent meals. 4) take care of your feet and protect against injuries.  Follow-up with podiatry and ophthalmology as an outpatient.   Increase activity slowly   Complete by: As directed      Allergies as of 03/19/2020      Reactions   Latex Itching   Tape Hives      Medication List    STOP taking these medications   atorvastatin 20 MG tablet Commonly known as: LIPITOR   enalapril 2.5 MG tablet Commonly known as: Vasotec   insulin aspart 100 UNIT/ML injection Commonly known as: novoLOG     TAKE these medications   aspirin EC 81 MG tablet Take 81 mg by  mouth daily.   doxycycline 100 MG EC tablet Commonly known as: DORYX Take 1 tablet (100 mg total) by mouth 2 (two) times daily for 7 days.   freestyle lancets Use 4x a day   glucose blood test strip Commonly known as: FREESTYLE LITE Use 3x a day   Insulin Pen Needle 31G X 8 MM Misc Use as  directed   insulin regular 100 units/mL injection Commonly known as: NovoLIN R Take 3 times daily before meals per sliding scale What changed:   how much to take  how to take this  when to take this   Iron 325 (65 Fe) MG Tabs Take 1 tablet (325 mg total) by mouth in the morning and at bedtime.   multivitamin tablet Take 1 tablet by mouth daily. Reported on 12/12/2015   NovoLIN N 100 UNIT/ML injection Generic drug: insulin NPH Human Inject 15-20 Units into the skin See admin instructions. 20 units in the morning and 15 units in the evening with food   pantoprazole 40 MG tablet Commonly known as: PROTONIX Take 1 tablet (40 mg total) by mouth daily for 14 days.       Follow-up Information    Sandford Craze, NP. Schedule an appointment as soon as possible for a visit in 1 week(s).   Specialty: Internal Medicine Contact information: [address] [place] [address] [phone]              Allergies  Allergen Reactions  . Latex Itching  . Tape Hives    Consultations:  None   Procedures/Studies: DG Tibia/Fibula Right  Result Date: 03/17/2020 CLINICAL DATA:  Right leg swelling and redness from ankle to the thigh for 6 days. EXAM: RIGHT TIBIA AND FIBULA - 2 VIEW COMPARISON:  None. FINDINGS: Generalized subcutaneous reticulation. There are a few dystrophic soft tissue calcifications at the lower shin. No evidence of erosion, fracture, or subluxation. The soft tissue gas or opaque foreign body. IMPRESSION: Soft tissue swelling without gas or bony erosion. Electronically Signed   By: Marnee Spring M.D.   On: 03/17/2020 04:14   US RENAL  Result Date: 03/17/2020 CLINICAL DATA:  Acute kidney injury EXAM: RENAL / URINARY TRACT ULTRASOUND COMPLETE COMPARISON:  Report 10/25/2004 FINDINGS: Right Kidney: Renal measurements: 13 x 4.8 x 4.9 cm = volume: 160.2 mL. Cortical echogenicity is normal. Cyst at the midpole measuring 2.9 x 2.7 x 3.2 cm Left  Kidney: Renal measurements: 11.7 x 5.1 x 5 cm = volume: 157.8 mL. Echogenicity within normal limits. No mass or hydronephrosis visualized. Bladder: Appears normal for degree of bladder distention. Other: None. IMPRESSION: Simple appearing right renal cyst.  Otherwise negative examination Electronically Signed   By: Jasmine Pang M.D.   On: 03/17/2020 19:08   DG Foot Complete Right  Result Date: 03/17/2020 CLINICAL DATA:  Right leg swelling and redness from the ankle to thigh for 6 days EXAM: RIGHT FOOT COMPLETE - 3+ VIEW COMPARISON:  None. FINDINGS: Subcutaneous reticulation best seen about the ankle and dorsal foot. No evidence of fracture, subluxation, or erosion. Hallux valgus with bunion. IMPRESSION: Soft tissue swelling without erosion or soft tissue gas. Electronically Signed   By: Marnee Spring M.D.   On: 03/17/2020 04:13       Subjective: Patient seen and examined at the bedside this morning.  Hemodynamically stable for discharge to home today.  Discharge Exam: Vitals:   03/18/20 2041 03/19/20 0506  BP: (!) 135/93 95/63  Pulse: (!) 108  94  Resp: 18 17  Temp: 99.8 F (37.7 C) 98.9 F (37.2 C)  SpO2: 100% 99%   Vitals:   03/18/20 0600 03/18/20 1354 03/18/20 2041 03/19/20 0506  BP: 110/78 (!) 143/101 (!) 135/93 95/63  Pulse: 96 (!) 109 (!) 108 94  Resp: 16 18 18 17   Temp: 97.9 F (36.6 C) (!) 97.2 F (36.2 C) 99.8 F (37.7 C) 98.9 F (37.2 C)  TempSrc: Oral Axillary    SpO2: 100% 100% 100% 99%  Weight:      Height:        General: Pt is alert, awake, not in acute distress Cardiovascular: RRR, S1/S2 +, no rubs, no gallops Respiratory: CTA bilaterally, no wheezing, no rhonchi Abdominal: Soft, NT, ND, bowel sounds + Extremities: no edema, no cyanosis    The results of significant diagnostics from this hospitalization (including imaging, microbiology, ancillary and laboratory) are listed below for reference.     Microbiology: Recent Results (from the past 240  hour(s))  SARS Coronavirus 2 by RT PCR (hospital order, performed in [facility] hospital lab) Nasopharyngeal Nasopharyngeal Swab     Status: None   Collection Time: 03/17/20  3:35 AM   Specimen: Nasopharyngeal Swab  Result Value Ref Range Status   SARS Coronavirus 2 NEGATIVE NEGATIVE Final    Comment: (NOTE) SARS-CoV-2 target nucleic acids are NOT DETECTED.  The SARS-CoV-2 RNA is generally detectable in upper and lower respiratory specimens during the acute phase of infection. The lowest concentration of SARS-CoV-2 viral copies this assay can detect is 250 copies / mL. A negative result does not preclude SARS-CoV-2 infection and should not be used as the sole basis for treatment or other patient management decisions.  A negative result may occur with improper specimen collection / handling, submission of specimen other than nasopharyngeal swab, presence of viral mutation(s) within the areas targeted by this assay, and inadequate number of viral copies (<250 copies / mL). A negative result must be combined with clinical observations, patient history, and epidemiological information.  Fact Sheet for Patients:   05/17/20  Fact Sheet for Healthcare Providers: BoilerBrush.com.cy  This test is not yet approved or  cleared by the https://pope.com/ FDA and has been authorized for detection and/or diagnosis of SARS-CoV-2 by FDA under an Emergency Use Authorization (EUA).  This EUA will remain in effect (meaning this test can be used) for the duration of the COVID-19 declaration under Section 564(b)(1) of the Act, 21 U.S.C. section 360bbb-3(b)(1), unless the authorization is terminated or revoked sooner.  Performed at [facility], [address] [place], [place]   Blood culture (routine x 2)     Status: None (Preliminary result)   Collection Time: 03/17/20  3:45 AM   Specimen: BLOOD  Result Value Ref Range  Status   Specimen Description   Final    BLOOD LEFT ANTECUBITAL Performed at [facility], [address] [place], [place]    Special Requests   Final    BOTTLES DRAWN AEROBIC AND ANAEROBIC Blood Culture adequate volume Performed at [facility], [address] [place], [place]    Culture   Final    NO GROWTH < 24 HOURS Performed at [facility], [address] [place], [place]    Report Status PENDING  Incomplete  MRSA PCR Screening     Status: None   Collection Time: 03/17/20  2:13 PM   Specimen:  Nasopharyngeal  Result Value Ref Range Status   MRSA by PCR NEGATIVE NEGATIVE Final    Comment:        The GeneXpert MRSA Assay (FDA approved for NASAL specimens only), is one component of a comprehensive MRSA colonization surveillance program. It is not intended to diagnose MRSA infection nor to guide or monitor treatment for MRSA infections. Performed at [facility], [address] [place], [place] [address]      Labs: BNP (last 3 results) No results for input(s): BNP in the last 8760 hours. Basic Metabolic Panel: Recent Labs  Lab 03/17/20 0335 03/18/20 0450 03/19/20 0335  NA 129* 133* 134*  K 3.4* 3.8 3.8  CL 92* 102 104  CO2 26 21* 21*  GLUCOSE 351* 96 226*  BUN 25* 10 6  CREATININE 1.53* 1.17* 1.12*  CALCIUM 8.4* 8.0* 8.1*   Liver Function Tests: No results for input(s): AST, ALT, ALKPHOS, BILITOT, PROT, ALBUMIN in the last 168 hours. No results for input(s): LIPASE, AMYLASE in the last 168 hours. No results for input(s): AMMONIA in the last 168 hours. CBC: Recent Labs  Lab 03/17/20 0335 03/18/20 0450 03/19/20 0335  WBC 11.2* 8.1 6.5  NEUTROABS 8.4*  --  4.7  HGB 8.9* 7.7* 8.2*  HCT 27.6* 24.1* 26.0*  MCV 88.5 89.9 89.7  PLT 292 284 298   Cardiac Enzymes: No results for input(s): CKTOTAL, CKMB, CKMBINDEX, TROPONINI in the last 168 hours. BNP: Invalid input(s):  POCBNP CBG: Recent Labs  Lab 03/18/20 0746 03/18/20 1152 03/18/20 1705 03/18/20 2040 03/19/20 0807  GLUCAP 90 108* 210* 110* 162*   D-Dimer No results for input(s): DDIMER in the last 72 hours. Hgb A1c Recent Labs    03/18/20 0450  HGBA1C 9.9*   Lipid Profile No results for input(s): CHOL, HDL, LDLCALC, TRIG, CHOLHDL, LDLDIRECT in the last 72 hours. Thyroid function studies No results for input(s): TSH, T4TOTAL, T3FREE, THYROIDAB in the last 72 hours.  Invalid input(s): FREET3 Anemia work up Recent Labs    03/18/20 0940  FERRITIN 93  TIBC 297  IRON 20*   Urinalysis    Component Value Date/Time   COLORURINE YELLOW 08/08/2016 0813   APPEARANCEUR CLEAR 08/08/2016 0813   LABSPEC 1.010 08/08/2016 0813   PHURINE 6.5 08/08/2016 0813   GLUCOSEU >=1000 (A) 08/08/2016 0813   HGBUR SMALL (A) 08/08/2016 0813   BILIRUBINUR NEGATIVE 08/08/2016 0813   KETONESUR NEGATIVE 08/08/2016 0813   PROTEINUR NEGATIVE 12/12/2015 2026   UROBILINOGEN 0.2 08/08/2016 0813   NITRITE NEGATIVE 08/08/2016 0813   LEUKOCYTESUR NEGATIVE 08/08/2016 0813   Sepsis Labs Invalid input(s): PROCALCITONIN,  WBC,  LACTICIDVEN Microbiology Recent Results (from the past 240 hour(s))  SARS Coronavirus 2 by RT PCR (hospital order, performed in [facility] hospital lab) Nasopharyngeal Nasopharyngeal Swab     Status: None   Collection Time: 03/17/20  3:35 AM   Specimen: Nasopharyngeal Swab  Result Value Ref Range Status   SARS Coronavirus 2 NEGATIVE NEGATIVE Final    Comment: (NOTE) SARS-CoV-2 target nucleic acids are NOT DETECTED.  The SARS-CoV-2 RNA is generally detectable in upper and lower respiratory specimens during the acute phase of infection. The lowest concentration of SARS-CoV-2 viral copies this assay can detect is 250 copies / mL. A negative result does not preclude SARS-CoV-2 infection and should not be used as the sole basis for treatment or other patient management decisions.  A negative  result may occur with improper specimen collection / handling, submission of specimen  other than nasopharyngeal swab, presence of viral mutation(s) within the areas targeted by this assay, and inadequate number of viral copies (<250 copies / mL). A negative result must be combined with clinical observations, patient history, and epidemiological information.  Fact Sheet for Patients:   BoilerBrush.com.cy  Fact Sheet for Healthcare Providers: https://pope.com/  This test is not yet approved or  cleared by the [place] FDA and has been authorized for detection and/or diagnosis of SARS-CoV-2 by FDA under an Emergency Use Authorization (EUA).  This EUA will remain in effect (meaning this test can be used) for the duration of the COVID-19 declaration under Section 564(b)(1) of the Act, 21 U.S.C. section 360bbb-3(b)(1), unless the authorization is terminated or revoked sooner.  Performed at [facility], [address] [place], [place] [address]   Blood culture (routine x 2)     Status: None (Preliminary result)   Collection Time: 03/17/20  3:45 AM   Specimen: BLOOD  Result Value Ref Range Status   Specimen Description   Final    BLOOD LEFT ANTECUBITAL Performed at [facility], [address] [place], [place] [address]    Special Requests   Final    BOTTLES DRAWN AEROBIC AND ANAEROBIC Blood Culture adequate volume Performed at [facility], [address] [place], [place] [address]    Culture   Final    NO GROWTH < 24 HOURS Performed at [facility], [address], [place], [place] [address]    Report Status PENDING  Incomplete  MRSA PCR Screening     Status: None   Collection Time: 03/17/20  2:13 PM   Specimen: Nasopharyngeal  Result Value Ref Range Status   MRSA by PCR NEGATIVE NEGATIVE Final    Comment:        The GeneXpert MRSA Assay (FDA approved for NASAL specimens only), is  one component of a comprehensive MRSA colonization surveillance program. It is not intended to diagnose MRSA infection nor to guide or monitor treatment for MRSA infections. Performed at [facility], [address], [place], [place] [address]     Please note: You were cared for by a hospitalist during your hospital stay. Once you are discharged, your primary care physician will handle any further medical issues. Please note that NO REFILLS for any discharge medications will be authorized once you are discharged, as it is imperative that you return to your primary care physician (or establish a relationship with a primary care physician if you do not have one) for your post hospital discharge needs so that they can reassess your need for medications and monitor your lab values.    Time coordinating discharge: 40 minutes  SIGNED:   Burnadette Pop, MD  [facility] 03/19/2020, 10:40 AM Pager [phone]  If 7PM-7AM, please contact night-coverage [email or web address] Password [email or web address]

## 2020-03-20 ENCOUNTER — Telehealth: Payer: Self-pay | Admitting: Family

## 2020-03-20 LAB — ANTISTREPTOLYSIN O TITER: ASO: 110 IU/mL (ref 0.0–200.0)

## 2020-03-20 NOTE — Telephone Encounter (Signed)
Please contact pt to schedule 1 week hospital follow up.

## 2020-03-21 NOTE — Telephone Encounter (Signed)
Transition Care Management Unsuccessful Follow-up Telephone Call  Date of discharge and from where:  03/19/2020; [facility]  Attempts:  1st Attempt  Reason for unsuccessful TCM follow-up call:  Unable to leave message

## 2020-03-22 LAB — CULTURE, BLOOD (ROUTINE X 2)
Culture: NO GROWTH
Special Requests: ADEQUATE

## 2020-03-22 NOTE — Telephone Encounter (Signed)
Transition Care Management Unsuccessful Follow-up Telephone Call  Date of discharge and from where:  03/19/2020, [facility]  Attempts:  2nd Attempt  Reason for unsuccessful TCM follow-up call:  Unable to leave message

## 2020-03-23 ENCOUNTER — Encounter: Payer: Self-pay | Admitting: Family

## 2020-03-23 ENCOUNTER — Ambulatory Visit (INDEPENDENT_AMBULATORY_CARE_PROVIDER_SITE_OTHER): Payer: Self-pay | Admitting: Family

## 2020-03-23 ENCOUNTER — Other Ambulatory Visit: Payer: Self-pay

## 2020-03-23 VITALS — BP 122/90 | HR 98 | Temp 98.6°F | Resp 16 | Ht 65.5 in | Wt 191.0 lb

## 2020-03-23 DIAGNOSIS — D509 Iron deficiency anemia, unspecified: Secondary | ICD-10-CM

## 2020-03-23 DIAGNOSIS — I25119 Atherosclerotic heart disease of native coronary artery with unspecified angina pectoris: Secondary | ICD-10-CM

## 2020-03-23 DIAGNOSIS — E1065 Type 1 diabetes mellitus with hyperglycemia: Secondary | ICD-10-CM

## 2020-03-23 DIAGNOSIS — L03115 Cellulitis of right lower limb: Secondary | ICD-10-CM

## 2020-03-23 LAB — CULTURE, BLOOD (ROUTINE X 2)
Culture: NO GROWTH
Special Requests: ADEQUATE

## 2020-03-23 MED ORDER — DOCUSATE SODIUM 100 MG PO CAPS: 100.0000 mg | ORAL_CAPSULE | Freq: Two times a day (BID) | ORAL | 0 refills | Status: DC | PRN

## 2020-03-23 MED ORDER — ATORVASTATIN CALCIUM 10 MG PO TABS: 10.0000 mg | ORAL_TABLET | Freq: Every day | ORAL | 1 refills | Status: DC

## 2020-03-23 NOTE — Patient Instructions (Addendum)
[facility] to [facility]/ultrasound. [phone] Call Dr. Charlean Sanfilippo office to schedule a follow up appointment.   Add colace twice daily as needed for constipation. Continue iron twice daily.   Complete doxycycline for your leg infection.

## 2020-03-23 NOTE — Progress Notes (Signed)
Subjective:    Patient ID: Abigail Moran, female    DOB: February 08, 1976, 44 y.o.   MRN: [id number]  HPI  Patient is a 44 yr old female who presents today for hospital follow up. She was admitted 9/9-9/11/21 for treatment of RLE cellulitis. She had acute renal insufficiency on admission which improved with hydration.  She reports that she is taking doxycycline which was prescribed at discharge. She reports improvement in the redness and pain in her RLE.    Iron deficiency anemia- was given IV iron infusion while in the hospital.  Reports first 2 days pretty heavy, 3rd day slower, 4th day "barely there." Reports 28 day cycle.  Lab Results  Component Value Date   WBC 6.5 03/19/2020   HGB 8.2 (L) 03/19/2020   HCT 26.0 (L) 03/19/2020   MCV 89.7 03/19/2020   PLT 298 03/19/2020   Hyperlipidemia- not taking statin.  Lab Results  Component Value Date   CHOL 176 01/29/2020   HDL 71.10 01/29/2020   LDLCALC 93 01/29/2020   TRIG 57.0 01/29/2020   CHOLHDL 2 01/29/2020   DM2- reports that she was not taking her insulin on time prior to admission. She is working on this.  Feeling motivated to work on her health and is also in the process of trying to obtain insurance.  Lab Results  Component Value Date   HGBA1C 9.9 (H) 03/18/2020   HGBA1C 10.6 Repeated and verified X2. (H) 12/22/2019   HGBA1C 9.6 (H) 08/08/2016   Lab Results  Component Value Date   MICROALBUR 4.8 (H) 12/22/2019   LDLCALC 93 01/29/2020   CREATININE 1.12 (H) 03/19/2020         Review of Systems    see HPI  Past Medical History:  Diagnosis Date  . Allergy   . Cellulitis and abscess of foot 03/18/2020   right foot  . Diabetes mellitus without complication (HCC)   . Heart attack (HCC) 05/04/15  . History of DVT (deep vein thrombosis)    following hospitalization 2020  . History of kidney stones   . Renal disorder   . Vitamin D deficiency 09/14/2014     Social History   Socioeconomic History  . Marital  status: Single    Spouse name: Not on file  . Number of children: Not on file  . Years of education: Not on file  . Highest education level: Not on file  Occupational History  . Not on file  Tobacco Use  . Smoking status: Never Smoker  . Smokeless tobacco: Never Used  Vaping Use  . Vaping Use: Never used  Substance and Sexual Activity  . Alcohol use: Yes    Alcohol/week: 0.0 standard drinks    Comment: social  . Drug use: Yes    Comment: occasional marijuana  for nausea  . Sexual activity: Yes  Other Topics Concern  . 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 Difficulty of Paying Living Expenses: Not on file  Food Insecurity:   . Worried About Programme researcher, broadcasting/film/video in the Last Year: Not on file  . Ran Out of Food in the Last Year: Not on file  Transportation Needs:   . Lack of Transportation (Medical): Not on file  . Lack of Transportation (  Non-Medical): Not on file  Physical Activity:   . Days of Exercise per Week: Not on file  . Minutes of Exercise per Session: Not on file  Stress:   . Feeling of Stress : Not on file  Social Connections:   . Frequency of Communication with Friends and Family: Not on file  . Frequency of Social Gatherings with Friends and Family: Not on file  . Attends Religious Services: Not on file  . Active Member of Clubs or Organizations: Not on file  . Attends Banker Meetings: Not on file  . Marital Status: Not on file  Intimate Partner Violence:   . Fear of Current or Ex-Partner: Not on file  . Emotionally Abused: Not on file  . Physically Abused: Not on file  . Sexually Abused: Not on file    Past Surgical History:  Procedure Laterality Date  . CARDIAC CATHETERIZATION  2016  . CHOLECYSTECTOMY  2001    Family History  Problem Relation Age of Onset    . Diabetes Father   . Hypertension Father   . Hyperlipidemia Father   . Cancer Maternal Grandfather        liver and ? colon  . Kidney disease Neg Hx   . Heart disease Neg Hx     Allergies  Allergen Reactions  . Latex Itching  . Tape Hives    Current Outpatient Medications on File Prior to Visit  Medication Sig Dispense Refill  . aspirin EC 81 MG tablet Take 81 mg by mouth daily.    Marland Kitchen doxycycline (DORYX) 100 MG EC tablet Take 1 tablet (100 mg total) by mouth 2 (two) times daily for 7 days. 14 tablet 0  . Ferrous Sulfate (IRON) 325 (65 Fe) MG TABS Take 1 tablet (325 mg total) by mouth in the morning and at bedtime. 30 tablet 0  . glucose blood (FREESTYLE LITE) test strip Use 3x a day 300 each 11  . insulin NPH Human (NOVOLIN N) 100 UNIT/ML injection Inject 15-20 Units into the skin See admin instructions. 20 units in the morning and 15 units in the evening with food    . Insulin Pen Needle 31G X 8 MM MISC Use as directed 100 each 3  . insulin regular (NOVOLIN R) 100 units/mL injection Take 3 times daily before meals per sliding scale (Patient taking differently: Inject 3-6 Units into the skin 3 (three) times daily with meals. Take 3 times daily before meals per sliding scale) 10 mL 11  . Lancets (FREESTYLE) lancets Use 4x a day 300 each 11  . Multiple Vitamin (MULTIVITAMIN) tablet Take 1 tablet by mouth daily. Reported on 12/12/2015    . pantoprazole (PROTONIX) 40 MG tablet Take 1 tablet (40 mg total) by mouth daily for 14 days. 14 tablet 0   No current facility-administered medications on file prior to visit.    BP 122/90 (BP Location: Right Arm, Patient Position: Sitting, Cuff Size: Small)   Pulse 98   Temp 98.6 F (37 C) (Oral)   Resp 16   Ht 5' 5.5" (1.664 m)   Wt 191 lb (86.6 kg)   LMP 03/10/2020   SpO2 100%   BMI 31.30 kg/m    Objective:   Physical Exam Constitutional:      Appearance: She is well-developed.  Neck:     Thyroid: No thyromegaly.  Cardiovascular:      Rate and Rhythm: Normal rate and regular rhythm.     Heart sounds: Normal heart sounds.  No murmur heard.   Pulmonary:     Effort: Pulmonary effort is normal. No respiratory distress.     Breath sounds: Normal breath sounds. No wheezing.  Musculoskeletal:     Cervical back: Neck supple.  Skin:    General: Skin is warm and dry.     Comments: + swelling of RLE up to knee + erythema of RLE to mid shin- warm to touch.   Scab noted on dorsal right great toe and dorsal 2nd toe  Neurological:     Mental Status: She is alert and oriented to person, place, and time.  Psychiatric:        Behavior: Behavior normal.        Thought Content: Thought content normal.        Judgment: Judgment normal.           Assessment & Plan:  Cellulitis- pt feels that she as improved a lot since her hospitalization. I advised her to continue doxycycline and to let me know if redness/swelling worsens or if it does not improve.  DM1- she is motivated to work harder.  Continue Novolin R TID and N bid.   CAD-Advised addition of statin for secondary CV prevention. She understands that she should not become pregnant while taking this medication.  Anemia- was not compliant with iron due constipation. Advised pt to add colace bid prn with her iron.  Counseled pt on the importance of the covid vaccine.   This visit occurred during the SARS-CoV-2 public health emergency.  Safety protocols were in place, including screening questions prior to the visit, additional usage of staff PPE, and extensive cleaning of exam room while observing appropriate contact time as indicated for disinfecting solutions.

## 2020-03-23 NOTE — Addendum Note (Signed)
Addended by: Mervin Kung A on: 03/23/2020 02:43 PM   Modules accepted: Orders

## 2020-03-28 ENCOUNTER — Other Ambulatory Visit: Payer: Self-pay

## 2020-04-22 ENCOUNTER — Ambulatory Visit: Payer: Self-pay | Admitting: Family

## 2020-06-27 ENCOUNTER — Ambulatory Visit: Payer: Self-pay | Admitting: Family

## 2020-08-22 ENCOUNTER — Other Ambulatory Visit: Payer: Self-pay

## 2020-08-22 ENCOUNTER — Encounter (HOSPITAL_BASED_OUTPATIENT_CLINIC_OR_DEPARTMENT_OTHER): Payer: Self-pay

## 2020-08-22 ENCOUNTER — Emergency Department (HOSPITAL_BASED_OUTPATIENT_CLINIC_OR_DEPARTMENT_OTHER)
Admission: EM | Admit: 2020-08-22 | Discharge: 2020-08-22 | Disposition: A | Discharge status: Home / Self Care | Payer: 59 | Attending: Emergency Medicine | Admitting: Emergency Medicine

## 2020-08-22 DIAGNOSIS — S90822A Blister (nonthermal), left foot, initial encounter: Secondary | ICD-10-CM | POA: Insufficient documentation

## 2020-08-22 DIAGNOSIS — R000 Tachycardia, unspecified: Secondary | ICD-10-CM | POA: Insufficient documentation

## 2020-08-22 DIAGNOSIS — W2209XA Striking against other stationary object, initial encounter: Secondary | ICD-10-CM | POA: Diagnosis not present

## 2020-08-22 DIAGNOSIS — S90821A Blister (nonthermal), right foot, initial encounter: Secondary | ICD-10-CM | POA: Diagnosis not present

## 2020-08-22 DIAGNOSIS — I509 Heart failure, unspecified: Secondary | ICD-10-CM | POA: Insufficient documentation

## 2020-08-22 DIAGNOSIS — Z7982 Long term (current) use of aspirin: Secondary | ICD-10-CM | POA: Diagnosis not present

## 2020-08-22 DIAGNOSIS — Z91040 Latex allergy status: Secondary | ICD-10-CM | POA: Insufficient documentation

## 2020-08-22 DIAGNOSIS — I2510 Atherosclerotic heart disease of native coronary artery without angina pectoris: Secondary | ICD-10-CM | POA: Diagnosis not present

## 2020-08-22 DIAGNOSIS — S99921A Unspecified injury of right foot, initial encounter: Secondary | ICD-10-CM | POA: Diagnosis present

## 2020-08-22 DIAGNOSIS — E109 Type 1 diabetes mellitus without complications: Secondary | ICD-10-CM | POA: Insufficient documentation

## 2020-08-22 DIAGNOSIS — S90829A Blister (nonthermal), unspecified foot, initial encounter: Secondary | ICD-10-CM

## 2020-08-22 MED ORDER — CEPHALEXIN 500 MG PO CAPS: 500.0000 mg | ORAL_CAPSULE | Freq: Four times a day (QID) | ORAL | 0 refills | Status: AC

## 2020-08-22 NOTE — ED Triage Notes (Signed)
Pt c/o "sore and red to both pinky toes"-first noticed 2 days ago-NAD-steady gait

## 2020-08-22 NOTE — ED Notes (Signed)
Pt states she feels much better CBG 71, instructed pt to call pull over and call 911 if she feels like her sugar is getting low again , verbal understaning
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
## 2020-08-22 NOTE — Discharge Instructions (Signed)
Like we discussed, please try to keep the blisters on your feet warm and dry.  Keep them covered while at work.  Please try to wear supportive shoes that do not increase irritation and rub the regions making them worse.  I am going to prescribe you an antibiotic called Keflex.  Please take this 4 times a day for the next 7 days.  Do not stop taking it early.  Please follow-up with your regular doctor to have your feet reevaluated.  You can always return to the ER if you develop new or worsening symptoms.  It was a pleasure to meet you.

## 2020-08-22 NOTE — ED Notes (Addendum)
Pt states " I think my sugar is low" CBG 35, OJ , PB and crackers given to pt , pt states she is going to get something to eat

## 2020-08-23 LAB — CBG MONITORING, ED
Glucose-Capillary: 35 mg/dL — CL (ref 70–99)
Glucose-Capillary: 46 mg/dL — ABNORMAL LOW (ref 70–99)
Glucose-Capillary: 58 mg/dL — ABNORMAL LOW (ref 70–99)
Glucose-Capillary: 71 mg/dL (ref 70–99)

## 2020-08-29 ENCOUNTER — Ambulatory Visit: Payer: 59 | Admitting: Family

## 2020-08-29 DIAGNOSIS — Z0289 Encounter for other administrative examinations: Secondary | ICD-10-CM

## 2020-08-31 ENCOUNTER — Ambulatory Visit (INDEPENDENT_AMBULATORY_CARE_PROVIDER_SITE_OTHER): Payer: 59 | Admitting: Family

## 2020-08-31 ENCOUNTER — Other Ambulatory Visit: Payer: Self-pay

## 2020-08-31 ENCOUNTER — Telehealth: Payer: Self-pay | Admitting: Family

## 2020-08-31 ENCOUNTER — Encounter: Payer: Self-pay | Admitting: Family

## 2020-08-31 DIAGNOSIS — E1065 Type 1 diabetes mellitus with hyperglycemia: Secondary | ICD-10-CM

## 2020-08-31 DIAGNOSIS — N6320 Unspecified lump in the left breast, unspecified quadrant: Secondary | ICD-10-CM | POA: Diagnosis not present

## 2020-08-31 DIAGNOSIS — T25229D Burn of second degree of unspecified foot, subsequent encounter: Secondary | ICD-10-CM

## 2020-08-31 DIAGNOSIS — D649 Anemia, unspecified: Secondary | ICD-10-CM | POA: Diagnosis not present

## 2020-08-31 DIAGNOSIS — T304 Corrosion of unspecified body region, unspecified degree: Secondary | ICD-10-CM | POA: Diagnosis not present

## 2020-08-31 DIAGNOSIS — Z0000 Encounter for general adult medical examination without abnormal findings: Secondary | ICD-10-CM

## 2020-08-31 LAB — COMPREHENSIVE METABOLIC PANEL
ALT: 13 U/L (ref 0–35)
AST: 17 U/L (ref 0–37)
Albumin: 3.3 g/dL — ABNORMAL LOW (ref 3.5–5.2)
Alkaline Phosphatase: 91 U/L (ref 39–117)
BUN: 20 mg/dL (ref 6–23)
CO2: 23 mEq/L (ref 19–32)
Calcium: 8.4 mg/dL (ref 8.4–10.5)
Chloride: 107 mEq/L (ref 96–112)
Creatinine, Ser: 1.18 mg/dL (ref 0.40–1.20)
GFR: 55.64 mL/min — ABNORMAL LOW (ref >60.00)
Glucose, Bld: 53 mg/dL — ABNORMAL LOW (ref 70–99)
Potassium: 3.8 mEq/L (ref 3.5–5.1)
Sodium: 137 mEq/L (ref 135–145)
Total Bilirubin: 0.5 mg/dL (ref 0.2–1.2)
Total Protein: 6.6 g/dL (ref 6.0–8.3)

## 2020-08-31 LAB — CBC WITH DIFFERENTIAL/PLATELET
Basophils Absolute: 0 10*3/uL (ref 0.0–0.1)
Basophils Relative: 0.5 % (ref 0.0–3.0)
Eosinophils Absolute: 0.1 10*3/uL (ref 0.0–0.7)
Eosinophils Relative: 2.2 % (ref 0.0–5.0)
HCT: 25.3 % — ABNORMAL LOW (ref 36.0–46.0)
Hemoglobin: 8.4 g/dL — ABNORMAL LOW (ref 12.0–15.0)
Lymphocytes Relative: 25.6 % (ref 12.0–46.0)
Lymphs Abs: 1.2 10*3/uL (ref 0.7–4.0)
MCHC: 33.1 g/dL (ref 30.0–36.0)
MCV: 88.8 fl (ref 78.0–100.0)
Monocytes Absolute: 0.5 10*3/uL (ref 0.1–1.0)
Monocytes Relative: 11.2 % (ref 3.0–12.0)
Neutro Abs: 2.8 10*3/uL (ref 1.4–7.7)
Neutrophils Relative %: 60.5 % (ref 43.0–77.0)
Platelets: 285 10*3/uL (ref 150.0–400.0)
RBC: 2.85 Mil/uL — ABNORMAL LOW (ref 3.87–5.11)
RDW: 14.7 % (ref 11.5–15.5)
WBC: 4.6 10*3/uL (ref 4.0–10.5)

## 2020-08-31 LAB — HEMOGLOBIN A1C: Hgb A1c MFr Bld: 10.7 % — ABNORMAL HIGH (ref 4.6–6.5)

## 2020-08-31 LAB — FERRITIN: Ferritin: 64.5 ng/mL (ref 10.0–291.0)

## 2020-08-31 LAB — IRON: Iron: 32 ug/dL — ABNORMAL LOW (ref 42–145)

## 2020-08-31 MED ORDER — BLOOD GLUCOSE MONITOR KIT
PACK | 0 refills | Status: DC
Start: 2020-08-31 — End: 2020-09-28

## 2020-08-31 NOTE — Patient Instructions (Addendum)
You should be contacted about your referral to podiatry, endocrinology, breast imaging. Please complete lab work prior to leaving.

## 2020-08-31 NOTE — Telephone Encounter (Signed)
Please advise pt that sugar is very uncontrolled.  I would like her to see endocrinology (referral was placed today at time of her visit).  Also, she is anemic.  Has she been taking the iron twice daily?  If so, I think we should refer her to hematology for iron infusion.
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
## 2020-09-01 NOTE — Telephone Encounter (Signed)
Patient advised of results and referral.   She reports she has not been taking the iron in 2 months. Patient advised to start taking twice a day.

## 2020-09-06 ENCOUNTER — Encounter: Payer: Self-pay | Admitting: Gastroenterology

## 2020-09-09 ENCOUNTER — Ambulatory Visit (INDEPENDENT_AMBULATORY_CARE_PROVIDER_SITE_OTHER): Payer: 59 | Admitting: Podiatry

## 2020-09-09 ENCOUNTER — Other Ambulatory Visit: Payer: Self-pay

## 2020-09-09 DIAGNOSIS — L97512 Non-pressure chronic ulcer of other part of right foot with fat layer exposed: Secondary | ICD-10-CM

## 2020-09-09 DIAGNOSIS — E1065 Type 1 diabetes mellitus with hyperglycemia: Secondary | ICD-10-CM

## 2020-09-09 DIAGNOSIS — L97522 Non-pressure chronic ulcer of other part of left foot with fat layer exposed: Secondary | ICD-10-CM

## 2020-09-13 ENCOUNTER — Encounter: Payer: Self-pay | Admitting: Podiatry
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
## 2020-09-21 ENCOUNTER — Other Ambulatory Visit: Payer: Self-pay

## 2020-09-21 ENCOUNTER — Ambulatory Visit (INDEPENDENT_AMBULATORY_CARE_PROVIDER_SITE_OTHER): Payer: 59 | Admitting: Podiatry

## 2020-09-21 DIAGNOSIS — L97512 Non-pressure chronic ulcer of other part of right foot with fat layer exposed: Secondary | ICD-10-CM

## 2020-09-21 DIAGNOSIS — L97522 Non-pressure chronic ulcer of other part of left foot with fat layer exposed: Secondary | ICD-10-CM | POA: Diagnosis not present

## 2020-09-21 DIAGNOSIS — E1065 Type 1 diabetes mellitus with hyperglycemia: Secondary | ICD-10-CM | POA: Diagnosis not present

## 2020-09-27 ENCOUNTER — Other Ambulatory Visit: Payer: Self-pay | Admitting: Family Medicine

## 2020-09-27 ENCOUNTER — Encounter: Payer: Self-pay | Admitting: Podiatry

## 2020-09-27 DIAGNOSIS — E1065 Type 1 diabetes mellitus with hyperglycemia: Secondary | ICD-10-CM
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
## 2020-09-28 ENCOUNTER — Other Ambulatory Visit: Payer: Self-pay

## 2020-09-28 ENCOUNTER — Ambulatory Visit (INDEPENDENT_AMBULATORY_CARE_PROVIDER_SITE_OTHER): Payer: 59 | Admitting: Family

## 2020-09-28 ENCOUNTER — Encounter: Payer: Self-pay | Admitting: Family

## 2020-09-28 VITALS — BP 103/70 | HR 84 | Temp 98.4°F | Resp 16 | Ht 65.5 in | Wt 182.0 lb

## 2020-09-28 DIAGNOSIS — D509 Iron deficiency anemia, unspecified: Secondary | ICD-10-CM | POA: Diagnosis not present

## 2020-09-28 DIAGNOSIS — D500 Iron deficiency anemia secondary to blood loss (chronic): Secondary | ICD-10-CM | POA: Insufficient documentation

## 2020-09-28 DIAGNOSIS — R110 Nausea: Secondary | ICD-10-CM

## 2020-09-28 DIAGNOSIS — E1065 Type 1 diabetes mellitus with hyperglycemia: Secondary | ICD-10-CM

## 2020-09-28 DIAGNOSIS — L97519 Non-pressure chronic ulcer of other part of right foot with unspecified severity: Secondary | ICD-10-CM | POA: Diagnosis not present

## 2020-09-28 DIAGNOSIS — L97529 Non-pressure chronic ulcer of other part of left foot with unspecified severity: Secondary | ICD-10-CM

## 2020-09-28 DIAGNOSIS — K3184 Gastroparesis: Secondary | ICD-10-CM

## 2020-09-28 DIAGNOSIS — Z0000 Encounter for general adult medical examination without abnormal findings: Secondary | ICD-10-CM | POA: Diagnosis not present

## 2020-09-28 MED ORDER — FREESTYLE LANCETS MISC: 11 refills | Status: DC

## 2020-09-28 MED ORDER — BLOOD GLUCOSE MONITOR KIT: PACK | 0 refills | Status: DC

## 2020-09-28 NOTE — Assessment & Plan Note (Signed)
She denies heavy menstrual cycles. States that oral iron causes her constipation and she has not been taking it. Will refer to hematology to be evaluated for IV iron infusion.

## 2020-09-28 NOTE — Assessment & Plan Note (Signed)
Management pe rpodiatry 

## 2020-09-28 NOTE — Patient Instructions (Signed)
Please schedule a routine eye exam and dental exam.  You should be contacted about scheduling your appointment with the gastroenterologist.  Continue your follow up with the foot doctor.  Keep your upcoming appointment at the [facility] and with Endocrinology.

## 2020-09-28 NOTE — Telephone Encounter (Signed)
At the last office visit she was here for a CPE and a referral was sent to ENDO. These Glucometer and supplies have not been filled in 6 years okay to send Rx has been pened.

## 2020-09-28 NOTE — Assessment & Plan Note (Signed)
Encouraged her to keep her upcoming appointment with endocrinology.

## 2020-09-28 NOTE — Assessment & Plan Note (Signed)
Colonoscopy, mammogram are scheduled. She declines flu shot and covid shot.  She is due for eye exam and dental exam and I have encouraged her to schedule these appointments.

## 2020-09-28 NOTE — Assessment & Plan Note (Signed)
Uncontrolled. Refer to GI for consulation.

## 2020-09-28 NOTE — Progress Notes (Signed)
Subjective:   By signing my name below, I, [name], attest that this documentation has been prepared under the direction and in the presence of [name]. 09/28/2020    Patient ID: Abigail Moran, female    DOB: Nov 04, 1975, 45 y.o.   MRN: [id number]  Chief Complaint  Patient presents with  . Annual Exam         HPI Patient is in today for comprehensive physical exam today. She complains of having nausea and vomiting over the past 2 months. She has constant vomiting bouts for a 2 weeks and then the symptoms resolve however, it takes some time for her abdomen to return back to normal. She denies any dysuria, urinary incontinence, hematuria, or urinary frequency.   DM  She takes 100 units of Novolin to manage her glucose levels. She has no new complaints at this time.  Lab Results  Component Value Date   HGBA1C 10.7 (H) 08/31/2020    Cholestorol She takes 10 mg of atorvastatin daily PO to manage her cholestorol.  Lab Results  Component Value Date   CHOL 176 01/29/2020   HDL 71.10 01/29/2020   LDLCALC 93 01/29/2020   TRIG 57.0 01/29/2020   CHOLHDL 2 01/29/2020   Bilateral Foot Ulcers She notes that her feet her healing. She states podiatry is managing her feet symptoms. She mentions that she likes to swim for exercise.   No changes in her FHx this year.   Past Medical History:  Diagnosis Date  . Allergy   . Cellulitis and abscess of foot 03/18/2020   right foot  . Diabetes mellitus without complication ([place])   . Heart attack ([place]) 05/04/15  . History of DVT (deep vein thrombosis)    following hospitalization 2020  . History of kidney stones   . Renal disorder   . Vitamin D deficiency 09/14/2014    Past Surgical History:  Procedure Laterality Date  . CARDIAC CATHETERIZATION  2016  . CHOLECYSTECTOMY  2001    Family History  Problem Relation Age of Onset  . Diabetes Father   . Hypertension Father   . Hyperlipidemia Father   . Cancer Maternal  Grandfather        liver and ? colon  . Kidney disease Neg Hx   . Heart disease Neg Hx     Social History   Socioeconomic History  . Marital status: Single    Spouse name: Not on file  . Number of children: Not on file  . Years of education: Not on file  . Highest education level: Not on file  Occupational History  . Not on file  Tobacco Use  . Smoking status: Never Smoker  . Smokeless tobacco: Never Used  Vaping Use  . Vaping Use: Never used  Substance and Sexual Activity  . Alcohol use: Not Currently  . Drug use: Not Currently  . Sexual activity: Not Currently  Other Topics Concern  . Not on file  Social History Narrative   Works as Probation officer    Lives with common law husband   No children   H[employer] school   Grew up in [place]   Social Determinants of Health   Financial Resource Strain: Not on file  Food Insecurity: Not on file  Transportation Needs: Not on file  Physical Activity: Not on file  Stress: Not on file  Social Connections: Not on file  Intimate Partner Violence: Not on file    Outpatient Medications Prior to Visit  Medication Sig  Dispense Refill  . aspirin EC 81 MG tablet Take 81 mg by mouth daily.    Marland Kitchen atorvastatin (LIPITOR) 10 MG tablet Take 1 tablet (10 mg total) by mouth daily. 90 tablet 1  . blood glucose meter kit and supplies KIT Dispense based on patient and insurance preference. Use up to four times daily as directed. (FOR ICD-9 250.00, 250.01). 1 each 0  . Ferrous Sulfate (IRON) 325 (65 Fe) MG TABS Take 1 tablet (325 mg total) by mouth in the morning and at bedtime. 30 tablet 0  . glucose blood (FREESTYLE LITE) test strip Use 3x a day 300 each 11  . insulin NPH Human (NOVOLIN N) 100 UNIT/ML injection Inject 15-20 Units into the skin See admin instructions. 20 units in the morning and 15 units in the evening with food    . Insulin Pen Needle 31G X 8 MM MISC Use as directed 100 each 3  . insulin regular (NOVOLIN R) 100 units/mL injection  Take 3 times daily before meals per sliding scale (Patient taking differently: Inject 3-6 Units into the skin 3 (three) times daily with meals. Take 3 times daily before meals per sliding scale) 10 mL 11  . Lancets (FREESTYLE) lancets Use 4x a day 300 each 11   No facility-administered medications prior to visit.    Allergies  Allergen Reactions  . Latex Itching  . Tape Hives    Review of Systems  Gastrointestinal: Positive for nausea and vomiting. Negative for blood in stool and diarrhea.  Genitourinary: Negative for dysuria, frequency and hematuria.       Objective:    Physical Exam Constitutional:      General: She is not in acute distress.    Appearance: Normal appearance. She is not ill-appearing.  HENT:     Head: Normocephalic and atraumatic.     Right Ear: External ear normal.     Left Ear: External ear normal.     Mouth/Throat:     Pharynx: No oropharyngeal exudate.  Eyes:     Extraocular Movements: Extraocular movements intact.     Pupils: Pupils are equal, round, and reactive to light.     Comments: No Nystagmus  Neck:     Thyroid: No thyromegaly.     Comments: (+)JVD Cardiovascular:     Rate and Rhythm: Normal rate and regular rhythm.     Pulses: Normal pulses.     Heart sounds: Normal heart sounds. No murmur heard.   Pulmonary:     Effort: Pulmonary effort is normal. No respiratory distress.     Breath sounds: Normal breath sounds. No wheezing or rales.  Abdominal:     General: Bowel sounds are normal.     Palpations: Abdomen is soft. There is no mass.     Tenderness: There is no abdominal tenderness. There is no guarding or rebound.  Musculoskeletal:        General: Normal range of motion.     Cervical back: Normal range of motion.     Right lower leg: No edema.     Left lower leg: No edema.     Comments: 5/5 strength in LE and UE  Skin:    General: Skin is warm and dry.     Comments: Bilateral foot dressings clean/dry/intact  Neurological:      Mental Status: She is alert and oriented to person, place, and time.     Sensory: Sensation is intact. No sensory deficit.     Motor: Motor function is  intact.     Deep Tendon Reflexes: Reflexes are normal and symmetric.     Reflex Scores:      Patellar reflexes are 2+ on the right side and 2+ on the left side. Psychiatric:        Behavior: Behavior normal.     BP 103/70 (BP Location: Right Arm, Patient Position: Sitting, Cuff Size: Small)   Pulse 84   Temp 98.4 F (36.9 C) (Oral)   Resp 16   Ht 5' 5.5" (1.664 m)   Wt 182 lb (82.6 kg)   SpO2 100%   BMI 29.83 kg/m  Wt Readings from Last 3 Encounters:  09/28/20 182 lb (82.6 kg)  08/31/20 194 lb 12.8 oz (88.4 kg)  08/22/20 175 lb (79.4 kg)    Diabetic Foot Exam - Simple   No data filed    Lab Results  Component Value Date   WBC 4.6 08/31/2020   HGB 8.4 Repeated and verified X2. (L) 08/31/2020   HCT 25.3 (L) 08/31/2020   PLT 285.0 08/31/2020   GLUCOSE 53 (L) 08/31/2020   CHOL 176 01/29/2020   TRIG 57.0 01/29/2020   HDL 71.10 01/29/2020   LDLCALC 93 01/29/2020   ALT 13 08/31/2020   AST 17 08/31/2020   NA 137 08/31/2020   K 3.8 08/31/2020   CL 107 08/31/2020   CREATININE 1.18 08/31/2020   BUN 20 08/31/2020   CO2 23 08/31/2020   TSH 1.23 01/29/2020   INR 1.0 03/17/2020   HGBA1C 10.7 (H) 08/31/2020   MICROALBUR 4.8 (H) 12/22/2019    Lab Results  Component Value Date   TSH 1.23 01/29/2020   Lab Results  Component Value Date   WBC 4.6 08/31/2020   HGB 8.4 Repeated and verified X2. (L) 08/31/2020   HCT 25.3 (L) 08/31/2020   MCV 88.8 08/31/2020   PLT 285.0 08/31/2020   Lab Results  Component Value Date   NA 137 08/31/2020   K 3.8 08/31/2020   CO2 23 08/31/2020   GLUCOSE 53 (L) 08/31/2020   BUN 20 08/31/2020   CREATININE 1.18 08/31/2020   BILITOT 0.5 08/31/2020   ALKPHOS 91 08/31/2020   AST 17 08/31/2020   ALT 13 08/31/2020   PROT 6.6 08/31/2020   ALBUMIN 3.3 (L) 08/31/2020   CALCIUM 8.4 08/31/2020    ANIONGAP 9 03/19/2020   GFR 55.64 (L) 08/31/2020   Lab Results  Component Value Date   CHOL 176 01/29/2020   Lab Results  Component Value Date   HDL 71.10 01/29/2020   Lab Results  Component Value Date   LDLCALC 93 01/29/2020   Lab Results  Component Value Date   TRIG 57.0 01/29/2020   Lab Results  Component Value Date   CHOLHDL 2 01/29/2020   Lab Results  Component Value Date   HGBA1C 10.7 (H) 08/31/2020       Assessment & Plan:   Problem List Items Addressed This Visit      Unprioritized   Type 1 diabetes mellitus, uncontrolled ([place]) (Chronic)    Encouraged her to keep her upcoming appointment with endocrinology.       Skin ulcers of foot, bilateral ([place])    Management per podiatry.       Preventative health care    Colonoscopy, mammogram are scheduled. She declines flu shot and covid shot.  She is due for eye exam and dental exam and I have encouraged her to schedule these appointments.       Gastroparesis  Uncontrolled. Refer to GI for consulation.       Anemia   Relevant Orders   CBC with Differential/Platelet   Ambulatory referral to Hematology    Other Visit Diagnoses    Nausea    -  Primary   Relevant Orders   Ambulatory referral to Gastroenterology      No orders of the defined types were placed in this encounter.   I, Nance Pear, NP, personally preformed the services described in this documentation.  All medical record entries made by the scribe were at my direction and in my presence.  I have reviewed the chart and discharge instructions (if applicable) and agree that the record reflects my personal performance and is accurate and complete. 09/28/2020

## 2020-09-29 ENCOUNTER — Telehealth: Payer: Self-pay | Admitting: *Deleted

## 2020-09-29 NOTE — Telephone Encounter (Signed)
Per referral 09/28/20 Dr. Peggyann Juba - called patient and gave upcoming appointments - mailed calendar with welcome packet

## 2020-10-05 ENCOUNTER — Ambulatory Visit (AMBULATORY_SURGERY_CENTER): Payer: Self-pay

## 2020-10-05 ENCOUNTER — Other Ambulatory Visit: Payer: Self-pay

## 2020-10-05 VITALS — Ht 65.5 in | Wt 191.0 lb

## 2020-10-05 DIAGNOSIS — Z1211 Encounter for screening for malignant neoplasm of colon: Secondary | ICD-10-CM

## 2020-10-05 MED ORDER — PEG-KCL-NACL-NASULF-NA ASC-C 100 G PO SOLR: 1.0000 | Freq: Once | ORAL | 0 refills | Status: AC
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
## 2020-10-07 ENCOUNTER — Ambulatory Visit: Payer: 59 | Admitting: Internal Medicine
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
## 2020-10-10 ENCOUNTER — Inpatient Hospital Stay: Payer: 59 | Attending: Hematology & Oncology

## 2020-10-10 ENCOUNTER — Inpatient Hospital Stay (HOSPITAL_BASED_OUTPATIENT_CLINIC_OR_DEPARTMENT_OTHER): Payer: 59 | Admitting: Hematology & Oncology

## 2020-10-10 ENCOUNTER — Other Ambulatory Visit: Payer: Self-pay

## 2020-10-10 ENCOUNTER — Encounter: Payer: Self-pay | Admitting: Hematology & Oncology

## 2020-10-10 VITALS — BP 116/91 | HR 86 | Temp 97.8°F | Resp 16 | Wt 186.0 lb

## 2020-10-10 DIAGNOSIS — D631 Anemia in chronic kidney disease: Secondary | ICD-10-CM

## 2020-10-10 DIAGNOSIS — D509 Iron deficiency anemia, unspecified: Secondary | ICD-10-CM

## 2020-10-10 DIAGNOSIS — N921 Excessive and frequent menstruation with irregular cycle: Secondary | ICD-10-CM

## 2020-10-10 DIAGNOSIS — D500 Iron deficiency anemia secondary to blood loss (chronic): Secondary | ICD-10-CM

## 2020-10-10 LAB — CBC WITH DIFFERENTIAL (CANCER CENTER ONLY)
Abs Immature Granulocytes: 0.03 10*3/uL (ref 0.00–0.07)
Basophils Absolute: 0 10*3/uL (ref 0.0–0.1)
Basophils Relative: 1 %
Eosinophils Absolute: 0.1 10*3/uL (ref 0.0–0.5)
Eosinophils Relative: 4 %
HCT: 28 % — ABNORMAL LOW (ref 36.0–46.0)
Hemoglobin: 8.7 g/dL — ABNORMAL LOW (ref 12.0–15.0)
Immature Granulocytes: 1 %
Lymphocytes Relative: 43 %
Lymphs Abs: 1.7 10*3/uL (ref 0.7–4.0)
MCH: 28.5 pg (ref 26.0–34.0)
MCHC: 31.1 g/dL (ref 30.0–36.0)
MCV: 91.8 fL (ref 80.0–100.0)
Monocytes Absolute: 0.3 10*3/uL (ref 0.1–1.0)
Monocytes Relative: 7 %
Neutro Abs: 1.7 10*3/uL (ref 1.7–7.7)
Neutrophils Relative %: 44 %
Platelet Count: 236 10*3/uL (ref 150–400)
RBC: 3.05 MIL/uL — ABNORMAL LOW (ref 3.87–5.11)
RDW: 15 % (ref 11.5–15.5)
WBC Count: 3.9 10*3/uL — ABNORMAL LOW (ref 4.0–10.5)
nRBC: 0 % (ref 0.0–0.2)

## 2020-10-10 LAB — CMP (CANCER CENTER ONLY)
ALT: 9 U/L (ref 0–44)
AST: 15 U/L (ref 15–41)
Albumin: 3.6 g/dL (ref 3.5–5.0)
Alkaline Phosphatase: 94 U/L (ref 38–126)
Anion gap: 7 (ref 5–15)
BUN: 29 mg/dL — ABNORMAL HIGH (ref 6–20)
CO2: 22 mmol/L (ref 22–32)
Calcium: 9.1 mg/dL (ref 8.9–10.3)
Chloride: 106 mmol/L (ref 98–111)
Creatinine: 1.28 mg/dL — ABNORMAL HIGH (ref 0.44–1.00)
GFR, Estimated: 53 mL/min — ABNORMAL LOW (ref >60)
Glucose, Bld: 150 mg/dL — ABNORMAL HIGH (ref 70–99)
Potassium: 3.6 mmol/L (ref 3.5–5.1)
Sodium: 135 mmol/L (ref 135–145)
Total Bilirubin: 0.5 mg/dL (ref 0.3–1.2)
Total Protein: 7 g/dL (ref 6.5–8.1)

## 2020-10-10 LAB — RETICULOCYTES
Immature Retic Fract: 15.6 % (ref 2.3–15.9)
RBC.: 3.06 MIL/uL — ABNORMAL LOW (ref 3.87–5.11)
Retic Count, Absolute: 46.5 10*3/uL (ref 19.0–186.0)
Retic Ct Pct: 1.5 % (ref 0.4–3.1)

## 2020-10-10 LAB — SAVE SMEAR(SSMR), FOR PROVIDER SLIDE REVIEW

## 2020-10-10 NOTE — Progress Notes (Signed)
Hematology and Oncology Follow Up Visit  Abigail Moran [id number] [birth year] 45 y.o. 10/10/2020   Principle Diagnosis:   Normochromic and normocytic anemia  Current Therapy:    Has been anemic for a long time.     Interim History:  Abigail Moran is is a very charming 44 year old African-American female.  She is very nice to talk to.  She is followed by [name].  She does have longstanding diabetes.  She does take insulin for this.  She has been noted to be anemic.  No back to 2017, her hemoglobin was 11.1 and hematocrit 34.  MCV was 82.  In July 2021, her white cell count is 4.2.  Hemoglobin 9.8.  Platelet count 261,000.  MCV was 90.  She has had iron in the past.  I think she has had some blood in the past.  In September 2021 she had iron studies done which showed a ferritin of 93 with an iron saturation of only 7%.  She is on oral iron we cannot take this because a constipation.  She does have some mild renal insufficiency.  Today, her BUN is 29 creatinine 1.28.  She has had no obvious bleeding.  She still has have her monthly cycles which she says were normal.  She has never had a child.  I think she has had one pregnancy white that was terminated.  She has had her gallbladder taken out when she was young.  She says that there is some history of cancer in the family.  She does not smoke.  She does not have alcoholic beverages.  Overall, I would say her performance status is ECOG 1.  She does chew ice.  Medications:  Current Outpatient Medications:  .  aspirin EC 81 MG tablet, Take 81 mg by mouth daily. (Patient not taking: Reported on 10/05/2020), Disp: , Rfl:  .  atorvastatin (LIPITOR) 10 MG tablet, Take 1 tablet (10 mg total) by mouth daily. (Patient not taking: Reported on 10/05/2020), Disp: 90 tablet, Rfl: 1 .  blood glucose meter kit and supplies KIT, Dispense based on patient and insurance preference. Use up to four times daily as directed. E11.9, Disp: 1  each, Rfl: 0 .  Ferrous Sulfate (IRON) 325 (65 Fe) MG TABS, Take 1 tablet (325 mg total) by mouth in the morning and at bedtime., Disp: 30 tablet, Rfl: 0 .  glucose blood (FREESTYLE LITE) test strip, USE FOUR TIMES DAILY, Disp: 100 strip, Rfl: 5 .  insulin NPH Human (NOVOLIN N) 100 UNIT/ML injection, Inject 15-20 Units into the skin See admin instructions. 20 units in the morning and 15 units in the evening with food, Disp: , Rfl:  .  Insulin Pen Needle 31G X 8 MM MISC, Use as directed, Disp: 100 each, Rfl: 3 .  insulin regular (NOVOLIN R) 100 units/mL injection, Take 3 times daily before meals per sliding scale (Patient taking differently: Inject 3-6 Units into the skin 3 (three) times daily with meals. Take 3 times daily before meals per sliding scale), Disp: 10 mL, Rfl: 11 .  Lancets (FREESTYLE) lancets, Use 4x a day, Disp: 300 each, Rfl: 11 .  PEG-3350/ELECTROLYTES/ASCORBAT 100 g SOLR, Take by mouth., Disp: , Rfl:   Allergies:  Allergies  Allergen Reactions  . Latex Itching  . Tape Hives    Past Medical History, Surgical history, Social history, and Family History were reviewed and updated.  Review of Systems: Review of Systems  Constitutional: Negative.   HENT:  Negative.  Eyes: Negative.   Respiratory: Negative.   Cardiovascular: Negative.   Gastrointestinal: Negative.   Endocrine: Negative.   Genitourinary: Negative.    Musculoskeletal: Negative.   Skin: Negative.   Neurological: Negative.   Hematological: Negative.   Psychiatric/Behavioral: Negative.     Physical Exam:  weight is 186 lb (84.4 kg). Her oral temperature is 97.8 F (36.6 C). Her blood pressure is 116/91 (abnormal) and her pulse is 86. Her respiration is 16 and oxygen saturation is 100%.   Wt Readings from Last 3 Encounters:  10/10/20 186 lb (84.4 kg)  10/05/20 191 lb (86.6 kg)  09/28/20 182 lb (82.6 kg)    Physical Exam Vitals reviewed.  HENT:     Head: Normocephalic and atraumatic.  Eyes:      Pupils: Pupils are equal, round, and reactive to light.  Cardiovascular:     Rate and Rhythm: Normal rate and regular rhythm.     Heart sounds: Normal heart sounds.  Pulmonary:     Effort: Pulmonary effort is normal.     Breath sounds: Normal breath sounds.  Abdominal:     General: Bowel sounds are normal.     Palpations: Abdomen is soft.  Musculoskeletal:        General: No tenderness or deformity. Normal range of motion.     Cervical back: Normal range of motion.  Lymphadenopathy:     Cervical: No cervical adenopathy.  Skin:    General: Skin is warm and dry.     Findings: No erythema or rash.  Neurological:     Mental Status: She is alert and oriented to person, place, and time.  Psychiatric:        Behavior: Behavior normal.        Thought Content: Thought content normal.        Judgment: Judgment normal.    Lab Results  Component Value Date   WBC 3.9 (L) 10/10/2020   HGB 8.7 (L) 10/10/2020   HCT 28.0 (L) 10/10/2020   MCV 91.8 10/10/2020   PLT 236 10/10/2020     Chemistry      Component Value Date/Time   NA 135 10/10/2020 1031   K 3.6 10/10/2020 1031   CL 106 10/10/2020 1031   CO2 22 10/10/2020 1031   BUN 29 (H) 10/10/2020 1031   CREATININE 1.28 (H) 10/10/2020 1031      Component Value Date/Time   CALCIUM 9.1 10/10/2020 1031   ALKPHOS 94 10/10/2020 1031   AST 15 10/10/2020 1031   ALT 9 10/10/2020 1031   BILITOT 0.5 10/10/2020 1031     I will get her blood smear.  She has normochromic normocytic red blood cells.  I saw no nucleated red blood cells.  There are no schistocytes.  She has no teardrop cells.  There is no target cells.  She has no rouleaux formation.  White blood cells appear normal in morphology and maturation.  She has no hypersegmented polys.  She has no atypical lymphocytes.  Platelets are adequate number and size.  Platelets are well granulated.  Impression and Plan: Abigail Moran is a very nice 45 year old African-American female.  She has  anemia.  I am surprised that she does not have microcytic anemia.  I would not think that she would have both iron deficiency and pernicious anemia.  We will have to see what her iron studies show.  I think what might be key is the fact that she is diabetic.  She has had longstanding diabetes and  she is on insulin.  I have to suspect that her erythropoietin level might be on the low side.  We will have to see what her erythropoietin level is.  She may need ESA.  I do not see anything that looks like an actual bone marrow malignancy.  I do not think she is going to need a bone marrow biopsy.  I do not see any indication that we have to do any scans on her.  I spent a good hour with her today.  She is very very nice.  I gave her a prayer blanket.  She was very thankful for this.  We will plan to get her back once we get the results back from her anemia studies.   Volanda Napoleon, MD 4/4/20224:14 PM

## 2020-10-11 ENCOUNTER — Encounter: Payer: Self-pay | Admitting: Hematology & Oncology

## 2020-10-11 ENCOUNTER — Telehealth: Payer: Self-pay | Admitting: Hematology & Oncology

## 2020-10-11 ENCOUNTER — Telehealth: Payer: Self-pay | Admitting: *Deleted

## 2020-10-11 ENCOUNTER — Telehealth: Payer: Self-pay

## 2020-10-11 DIAGNOSIS — N921 Excessive and frequent menstruation with irregular cycle: Secondary | ICD-10-CM

## 2020-10-11 DIAGNOSIS — D631 Anemia in chronic kidney disease: Secondary | ICD-10-CM | POA: Insufficient documentation

## 2020-10-11 HISTORY — DX: Anemia in chronic kidney disease: D63.1

## 2020-10-11 HISTORY — DX: Excessive and frequent menstruation with irregular cycle: N92.1

## 2020-10-11 LAB — IRON AND TIBC
Iron: 40 ug/dL — ABNORMAL LOW (ref 41–142)
Saturation Ratios: 14 % — ABNORMAL LOW (ref 21–57)
TIBC: 295 ug/dL (ref 236–444)
UIBC: 255 ug/dL (ref 120–384)

## 2020-10-11 LAB — ERYTHROPOIETIN: Erythropoietin: 17.3 m[IU]/mL (ref 2.6–18.5)

## 2020-10-11 LAB — FERRITIN: Ferritin: 63 ng/mL (ref 11–307)

## 2020-10-11 NOTE — Addendum Note (Signed)
Addended by: Arlan Organ R on: 10/11/2020 12:52 PM   Modules accepted: Orders

## 2020-10-11 NOTE — Telephone Encounter (Signed)
Called pt and she is aware of her iron appt on Friday 4/8

## 2020-10-11 NOTE — Telephone Encounter (Signed)
No los 4/4

## 2020-10-11 NOTE — Telephone Encounter (Signed)
Patient needs one dose of IV iron per Dr Myna Hidalgo.  Abigail Moran notified about choice of iron as Feraheme in care plan isn't marked authorized.  Per secure chat, patient approved for Feraheme or Venofer per Abigail Moran.  Msg sent to scheduler

## 2020-10-12 ENCOUNTER — Ambulatory Visit (INDEPENDENT_AMBULATORY_CARE_PROVIDER_SITE_OTHER): Payer: 59 | Admitting: Podiatry

## 2020-10-12 ENCOUNTER — Other Ambulatory Visit: Payer: Self-pay

## 2020-10-12 DIAGNOSIS — L97512 Non-pressure chronic ulcer of other part of right foot with fat layer exposed: Secondary | ICD-10-CM | POA: Diagnosis not present

## 2020-10-12 DIAGNOSIS — L97522 Non-pressure chronic ulcer of other part of left foot with fat layer exposed: Secondary | ICD-10-CM | POA: Diagnosis not present

## 2020-10-12 DIAGNOSIS — E1065 Type 1 diabetes mellitus with hyperglycemia: Secondary | ICD-10-CM

## 2020-10-12 LAB — HGB FRACTIONATION CASCADE
Hgb A2: 2.3 % (ref 1.8–3.2)
Hgb A: 97.7 % (ref 96.4–98.8)
Hgb F: 0 % (ref 0.0–2.0)
Hgb S: 0 %
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
## 2020-10-13 ENCOUNTER — Encounter: Payer: Self-pay | Admitting: Podiatry

## 2020-10-14 ENCOUNTER — Inpatient Hospital Stay: Payer: 59

## 2020-10-14 ENCOUNTER — Other Ambulatory Visit: Payer: Self-pay

## 2020-10-14 ENCOUNTER — Ambulatory Visit
Admission: RE | Admit: 2020-10-14 | Discharge: 2020-10-14 | Disposition: A | Discharge status: Home / Self Care | Payer: 59 | Source: Ambulatory Visit | Attending: Family | Admitting: Family

## 2020-10-14 ENCOUNTER — Encounter: Payer: Self-pay | Admitting: Internal Medicine

## 2020-10-14 ENCOUNTER — Other Ambulatory Visit: Payer: Self-pay | Admitting: Family

## 2020-10-14 ENCOUNTER — Ambulatory Visit (INDEPENDENT_AMBULATORY_CARE_PROVIDER_SITE_OTHER): Payer: 59 | Admitting: Internal Medicine

## 2020-10-14 VITALS — BP 143/89 | HR 91 | Temp 97.6°F | Resp 18

## 2020-10-14 VITALS — BP 140/90 | HR 92 | Ht 65.5 in | Wt 187.2 lb

## 2020-10-14 DIAGNOSIS — D509 Iron deficiency anemia, unspecified: Secondary | ICD-10-CM | POA: Diagnosis not present

## 2020-10-14 DIAGNOSIS — D500 Iron deficiency anemia secondary to blood loss (chronic): Secondary | ICD-10-CM

## 2020-10-14 DIAGNOSIS — N6320 Unspecified lump in the left breast, unspecified quadrant: Secondary | ICD-10-CM

## 2020-10-14 DIAGNOSIS — E1059 Type 1 diabetes mellitus with other circulatory complications: Secondary | ICD-10-CM

## 2020-10-14 DIAGNOSIS — N921 Excessive and frequent menstruation with irregular cycle: Secondary | ICD-10-CM

## 2020-10-14 MED ORDER — SODIUM CHLORIDE 0.9 % IV SOLN
510.0000 mg | Freq: Once | INTRAVENOUS | Status: AC
  Administered 2020-10-14: 510 mg via INTRAVENOUS
  Filled 2020-10-14: qty 510

## 2020-10-14 MED ORDER — INSULIN PEN NEEDLE 32G X 4 MM MISC: 3 refills | Status: DC

## 2020-10-14 MED ORDER — TRESIBA FLEXTOUCH 200 UNIT/ML ~~LOC~~ SOPN: 34.0000 [IU] | PEN_INJECTOR | Freq: Every day | SUBCUTANEOUS | 3 refills | Status: DC

## 2020-10-14 MED ORDER — SODIUM CHLORIDE 0.9 % IV SOLN
Freq: Once | INTRAVENOUS | Status: AC
  Filled 2020-10-14: qty 250

## 2020-10-14 MED ORDER — FREESTYLE LITE TEST VI STRP: ORAL_STRIP | 3 refills | Status: DC

## 2020-10-14 MED ORDER — FIASP FLEXTOUCH 100 UNIT/ML ~~LOC~~ SOPN: 6.0000 [IU] | PEN_INJECTOR | Freq: Three times a day (TID) | SUBCUTANEOUS | 3 refills | Status: DC

## 2020-10-14 NOTE — Patient Instructions (Addendum)
Please stop NPH and Regular insulin and start: - Tresiba 34-36 units daily - FiAsp (at the start of each meal): 6-10 units 3x a day  Check sugars 3x a day, before meals, and also occasionally at bedtime.  Check with your insurance which pumps and CGMs are covered.  Please return in 1.5-2 months with your sugar log.    Basic Rules for Patients with Type I Diabetes Mellitus  1. The American Diabetes Association (ADA) recommended targets: - fasting sugar 80-130 - after meal sugar <180 - HbA1C <7%  2. Engage in ?150 min moderate exercise per week  3. Make sure you have ?8h of sleep every night as this helps both blood sugars and your weight.  4. Always keep a sugar log (not only record in your meter) and bring it to all appointments with [place].  5. "15-15 rule" for hypoglycemia: if sugars are low, take 15 g of carbs** ("fast sugar" - e.g. 4 glucose tablets, 4 oz orange juice), wait 15 min, then check sugars again. If still <80, repeat. Continue  until your sugars >80, then eat a normal meal.   6. Teach family members and coworkers to inject glucagon. Have a glucagon set at home and one at work. They should call 911 after using the set.  7. Check sugar before driving. If <100, correct, and only start driving if sugars rise ?809. Check sugar every hour when on a long drive.  8. Check sugar before exercising. If <100, correct, and only start exercising if sugars rise ?100. Check sugar every hour when on a long exercise routine and 1h after you finished exercising.   If >250, check urine for ketones. If you have moderate-large ketones in urine, do not start exercise. Hydrate yourself with clear liquids and correct the high sugar. Recheck sugars and ketones before attempting to exercise.  Be aware that you might need less insulin when exercising.  *intense, short, exercise bursts can increase your sugars, but  *less intense, longer (>1h), exercise routines can decrease your sugars.    9. Make sure you have a MedAlert bracelet or pendant mentioning "Type I Diabetes Mellitus". If you have a prior episode of severe hypoglycemia or hypoglycemia unawareness, it should also mention this.  10. Please do not walk barefoot. Inspect your feet for sores/cuts and let us know if you have them.  **E.g. of "fast carbs": ? first choice (15 g):  1 tube glucose gel, GlucoPouch 15, 2 oz glucose liquid ? second choice (15-16 g):  3 or 4 glucose tablets (best taken  with water), 15 Dextrose Bits chewable ? third choice (15-20 g):   cup fruit juice,  cup regular soda, 1 cup skim milk,  1 cup sports drink ? fourth choice (15-20 g):  1 small tube [place] gel (not frosting), 2 tbsp raisins, 1 tbsp table sugar,  candy, jelly beans, gum drops - check package for carb amount   (adapted from: Juluis Rainier. "Insulin therapy and hypoglycemia" Endocrinol Metab Clin N Am 2012, 41: 57-87)  Sick Day Rules for Diabetes  Think S-K-I-L-L:  Sugars:  - if glucose >200, check every 3h and drink sugar free liquids  - if glucose <200, drink carb-containing liquids and recheck 30 min later  - if glucose high, correct with insulin  - if sugars <60, initiate hypoglycemia management (take 15 g of fast carbs and check sugars in 15 min  - repeat until sugars remain >100).  Ketones:  When to check ketones?  When glucose >  300 x2 if on insulin injections. When nausea, vomiting, diarrhea, abdominal pain, headache, fever - even if glucose is normal or low - because in this case, you need both glucose and insulin.    - if you have ketone strips for blood >> if ketones are more or equal than 0.6, need to increase insulin - if you have ketone strips for urine >> if ketones are more or equal than "small", need to increase insulin  Insulin: Never skip long acting insulin, even if not eating!   Urine ketones Blood ketones Extra insulin?  no <0.6 no  small 0.6-1.5 Increase dose by 5%  moderate 1.5-3  Increase dose by 10%  large >3 Increase dose by at least 20%   Liquids: - if glucose >200, check every 3h and drink sugar free liquids  - if glucose <200, small sips of carb-containing liquids (e.g. Ginger ale, Gatorade, juice, etc.)  Let us know!   Call us if: Go to ED if: Call your primary care doctor if:  Sugars >300 for >8h Severe abdominal pain Fever >100F for 24h  Moderate to large  urine ketones or blood ketones >1.5 Difficulty breathing Other chronic diseases flaring up  Vomiting and unable to keep liquids down Signs of dehydration

## 2020-10-14 NOTE — Patient Instructions (Signed)
Ferumoxytol injection What is this medicine? FERUMOXYTOL is an iron complex. Iron is used to make healthy red blood cells, which carry oxygen and nutrients throughout the body. This medicine is used to treat iron deficiency anemia. This medicine may be used for other purposes; ask your health care provider or pharmacist if you have questions. COMMON BRAND NAME(S): Feraheme What should I tell my health care provider before I take this medicine? They need to know if you have any of these conditions:  anemia not caused by low iron levels  high levels of iron in the blood  magnetic resonance imaging (MRI) test scheduled  an unusual or allergic reaction to iron, other medicines, foods, dyes, or preservatives  pregnant or trying to get pregnant  breast-feeding How should I use this medicine? This medicine is for injection into a vein. It is given by a health care professional in a hospital or clinic setting. Talk to your pediatrician regarding the use of this medicine in children. Special care may be needed. Overdosage: If you think you have taken too much of this medicine contact a poison control center or emergency room at once. NOTE: This medicine is only for you. Do not share this medicine with others. What if I miss a dose? It is important not to miss your dose. Call your doctor or health care professional if you are unable to keep an appointment. What may interact with this medicine? This medicine may interact with the following medications:  other iron products This list may not describe all possible interactions. Give your health care provider a list of all the medicines, herbs, non-prescription drugs, or dietary supplements you use. Also tell them if you smoke, drink alcohol, or use illegal drugs. Some items may interact with your medicine. What should I watch for while using this medicine? Visit your doctor or healthcare professional regularly. Tell your doctor or healthcare  professional if your symptoms do not start to get better or if they get worse. You may need blood work done while you are taking this medicine. You may need to follow a special diet. Talk to your doctor. Foods that contain iron include: whole grains/cereals, dried fruits, beans, or peas, leafy green vegetables, and organ meats (liver, kidney). What side effects may I notice from receiving this medicine? Side effects that you should report to your doctor or health care professional as soon as possible:  allergic reactions like skin rash, itching or hives, swelling of the face, lips, or tongue  breathing problems  changes in blood pressure  feeling faint or lightheaded, falls  fever or chills  flushing, sweating, or hot feelings  swelling of the ankles or feet Side effects that usually do not require medical attention (report to your doctor or health care professional if they continue or are bothersome):  diarrhea  headache  nausea, vomiting  stomach pain This list may not describe all possible side effects. Call your doctor for medical advice about side effects. You may report side effects to FDA at 1-800-FDA-1088. Where should I keep my medicine? This drug is given in a hospital or clinic and will not be stored at home. NOTE: This sheet is a summary. It may not cover all possible information. If you have questions about this medicine, talk to your doctor, pharmacist, or health care provider.  2021 Elsevier/Gold Standard (2016-08-13 20:21:10)  
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
## 2020-10-25 ENCOUNTER — Ambulatory Visit
Admission: RE | Admit: 2020-10-25 | Discharge: 2020-10-25 | Disposition: A | Discharge status: Home / Self Care | Payer: 59 | Source: Ambulatory Visit | Attending: Family | Admitting: Family

## 2020-10-25 ENCOUNTER — Other Ambulatory Visit: Payer: Self-pay

## 2020-10-25 DIAGNOSIS — N6320 Unspecified lump in the left breast, unspecified quadrant: Secondary | ICD-10-CM

## 2020-10-27 ENCOUNTER — Telehealth: Payer: Self-pay | Admitting: Gastroenterology

## 2020-10-27 NOTE — Telephone Encounter (Signed)
Patient called and did not have instructions for her colonoscopy on Friday. I asked her to look on MyChart but she could not find the letter. Told her basics for this morning in regards to being on CLD and no further solids. She has DM and is on long-acting insulin in the AM and short-acting insulin throughout the day for meals. I have asked her to take 3/4 of her daily Insulin today and adjust and give extra short-acting insulin throughout the day to counteract her blood sugars since she will be on CLD. She will take 1/4-1/2 of the daily long-acting insulin as per our protocol on the morning of her procedure (on Friday). I will have Dr. Barron Alvine updated and his RN reach out to patient to go over and ensure the rest of the instructions are available for her.  Corliss Parish, MD [facility] Advanced Endoscopy Office # [phone]

## 2020-10-27 NOTE — Telephone Encounter (Signed)
Spoke with patient in regards to her prep instructions and DM management instructions prior to her procedure. I have also resent the patient her instructions via My Chart message. She states that she was able to review the instructions. Answered all of patient's questions, she verbalized understanding and had no concerns at the end of the call. Advised patient to call us back or send a message if she had any further questions or concerns.

## 2020-10-28 ENCOUNTER — Ambulatory Visit: Payer: 59 | Admitting: Gastroenterology

## 2020-10-28 ENCOUNTER — Other Ambulatory Visit: Payer: Self-pay

## 2020-10-28 DIAGNOSIS — Z1211 Encounter for screening for malignant neoplasm of colon: Secondary | ICD-10-CM

## 2020-10-28 NOTE — Progress Notes (Signed)
Rescheduled pt for 11-01-20 at 8:00 am.  New instructions and Plenvu sample given.  Amb gi referral put in

## 2020-10-28 NOTE — Progress Notes (Signed)
Patient drank 8 oz of Ginger ale shortly before arrival to the [facility] at 10:00. Due to guidelines, need to postpone case at least 2 hours. I offered to complete the procedure this afternoon with Dr. Christella Hartigan instead of her scheduled 11:30 start time, however patient declined. She instead would prefer to reschedule for a later date with an early start time. I explained that would mean she would need to again complete the bowel preparation, and she understands, and would prefer that instead of waiting to perform case in afternoon, citing concerns about her care partner staying too late to wait. Offered unsedated colonoscopy, but declined that option as well.   Per patient preference, will cancel today's procedure and reschedule for a later date. All questions answered.

## 2020-10-31 ENCOUNTER — Telehealth: Payer: Self-pay

## 2020-10-31 NOTE — Telephone Encounter (Signed)
Pt called after hours line 10/27/2020 regarding low blood sugars. Pt was advisd at the time to go to ED for follow up. Pt refused. Pt was fasting for a colonoscopy and did not want to go to ED.   Called to follow up with pt and she advised she has since reduced her Tresiba from 34-36 units to 20 units daily. Pt is still taking 3-10 units of Fiasp. Sugars have since regulated.

## 2020-11-01 ENCOUNTER — Other Ambulatory Visit: Payer: Self-pay

## 2020-11-01 ENCOUNTER — Encounter: Payer: Self-pay | Admitting: Gastroenterology

## 2020-11-01 ENCOUNTER — Ambulatory Visit (AMBULATORY_SURGERY_CENTER): Payer: 59 | Admitting: Gastroenterology

## 2020-11-01 VITALS — BP 127/81 | HR 74 | Temp 97.9°F | Resp 10 | Ht 65.5 in | Wt 191.0 lb

## 2020-11-01 DIAGNOSIS — Z1211 Encounter for screening for malignant neoplasm of colon: Secondary | ICD-10-CM

## 2020-11-01 DIAGNOSIS — K640 First degree hemorrhoids: Secondary | ICD-10-CM

## 2020-11-01 MED ORDER — SODIUM CHLORIDE 0.9 % IV SOLN: 500.0000 mL | Freq: Once | INTRAVENOUS | Status: DC

## 2020-11-01 NOTE — Telephone Encounter (Signed)
Noted. Ty!

## 2020-11-01 NOTE — Progress Notes (Signed)
pt tolerated well. VSS. awake and to recovery. Report given to RN.  

## 2020-11-01 NOTE — Progress Notes (Signed)
VS by CW  Pt's states no medical or surgical changes since previsit or office visit.  

## 2020-11-01 NOTE — Op Note (Signed)
[facility] Patient Name: Abigail Moran Procedure Date: 11/01/2020 7:57 AM MRN: [id number] Endoscopist: Doristine Locks , MD Age: 45 Referring MD:  Date of Birth: January 23, 1976 Gender: Female Account #: [id number] Procedure:                Colonoscopy Indications:              Screening for colorectal malignant neoplasm, This                            is the patient's first colonoscopy Medicines:                Monitored Anesthesia Care Procedure:                Pre-Anesthesia Assessment:                           - Prior to the procedure, a History and Physical                            was performed, and patient medications and                            allergies were reviewed. The patient's tolerance of                            previous anesthesia was also reviewed. The risks                            and benefits of the procedure and the sedation                            options and risks were discussed with the patient.                            All questions were answered, and informed consent                            was obtained. Prior Anticoagulants: The patient has                            taken no previous anticoagulant or antiplatelet                            agents. ASA Grade Assessment: III - A patient with                            severe systemic disease. After reviewing the risks                            and benefits, the patient was deemed in                            satisfactory condition to undergo the procedure.  After obtaining informed consent, the colonoscope                            was passed under direct vision. Throughout the                            procedure, the patient's blood pressure, pulse, and                            oxygen saturations were monitored continuously. The                            Olympus CF-HQ190L [id number]) Colonoscope was                            introduced through the  anus and advanced to the the                            terminal ileum. The colonoscopy was performed                            without difficulty. The patient tolerated the                            procedure well. The quality of the bowel                            preparation was good. The terminal ileum, ileocecal                            valve, appendiceal orifice, and rectum were                            photographed. Scope In: 8:46:06 AM Scope Out: 8:58:32 AM Scope Withdrawal Time: 0 hours 8 minutes 3 seconds  Total Procedure Duration: 0 hours 12 minutes 26 seconds  Findings:                 The perianal and digital rectal examinations were                            normal.                           The colon appeared normal throughout.                           Non-bleeding internal hemorrhoids were found during                            retroflexion. The hemorrhoids were small.                           The terminal ileum appeared normal. Complications:            No immediate complications. Estimated Blood Loss:     Estimated  blood loss: none. Impression:               - The entire examined colon is normal.                           - Non-bleeding internal hemorrhoids.                           - The examined portion of the ileum was normal.                           - No specimens collected. Recommendation:           - Patient has a contact number available for                            emergencies. The signs and symptoms of potential                            delayed complications were discussed with the                            patient. Return to normal activities tomorrow.                            Written discharge instructions were provided to the                            patient.                           - Resume previous diet.                           - Continue present medications.                           - Repeat colonoscopy in 10 years for  screening                            purposes.                           - Return to GI office PRN. Doristine Locks, MD 11/01/2020 9:02:45 AM

## 2020-11-01 NOTE — Patient Instructions (Signed)
YOU HAD AN ENDOSCOPIC PROCEDURE TODAY AT THE [facility]:   Refer to the procedure report that was given to you for any specific questions about what was found during the examination.  If the procedure report does not answer your questions, please call your gastroenterologist to clarify.  If you requested that your care partner not be given the details of your procedure findings, then the procedure report has been included in a sealed envelope for you to review at your convenience later.  YOU SHOULD EXPECT: Some feelings of bloating in the abdomen. Passage of more gas than usual.  Walking can help get rid of the air that was put into your GI tract during the procedure and reduce the bloating. If you had a lower endoscopy (such as a colonoscopy or flexible sigmoidoscopy) you may notice spotting of blood in your stool or on the toilet paper. If you underwent a bowel prep for your procedure, you may not have a normal bowel movement for a few days.  Please Note:  You might notice some irritation and congestion in your nose or some drainage.  This is from the oxygen used during your procedure.  There is no need for concern and it should clear up in a day or so.  SYMPTOMS TO REPORT IMMEDIATELY:   Following lower endoscopy (colonoscopy):  Excessive amounts of blood in the stool  Significant tenderness or worsening of abdominal pains  Swelling of the abdomen that is new, acute  Fever of 100F or higher  For urgent or emergent issues, a gastroenterologist can be reached at any hour by calling [phone]. Do not use MyChart messaging for urgent concerns.    DIET:  We do recommend a small meal at first, but then you may proceed to your regular diet.  Drink plenty of fluids but you should avoid alcoholic beverages for 24 hours.  ACTIVITY:  You should plan to take it easy for the rest of today and you should NOT DRIVE , no work and no use of heavy machinery until tomorrow (because of the  sedation medicines used during the test).    FOLLOW UP: Our staff will call the number listed on your records Thursday 715-8 am  following your procedure to check on you and address any questions or concerns that you may have regarding the information given to you following your procedure. If we do not reach you, we will leave a message.  We will attempt to reach you two times.  During this call, we will ask if you have developed any symptoms of COVID 19. If you develop any symptoms (ie: fever, flu-like symptoms, shortness of breath, cough etc.) before then, please call [phone].  If you test positive for Covid 19 in the 2 weeks post procedure, please call and report this information to [place].    No polyps were seen, next colonoscopy is due in 10 years, if you have any issues before then, please call the office, thank you.    SIGNATURES/CONFIDENTIALITY: You and/or your care partner have signed paperwork which will be entered into your electronic medical record.  These signatures attest to the fact that that the information above on your After Visit Summary has been reviewed and is understood.  Full responsibility of the confidentiality of this discharge information lies with you and/or your care-partner.

## 2020-11-03 ENCOUNTER — Telehealth: Payer: Self-pay | Admitting: *Deleted

## 2020-11-03 NOTE — Telephone Encounter (Signed)
Follow up call made . Mailbox full. 

## 2020-11-03 NOTE — Telephone Encounter (Signed)
First attempt, no answer and mailbox if full.  

## 2020-12-02 ENCOUNTER — Ambulatory Visit: Payer: 59 | Admitting: Internal Medicine
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
## 2020-12-12 ENCOUNTER — Encounter: Payer: 59 | Attending: Internal Medicine | Admitting: Dietician

## 2021-01-20 ENCOUNTER — Encounter: Payer: 59 | Admitting: Gastroenterology

## 2021-03-31 ENCOUNTER — Other Ambulatory Visit: Payer: Self-pay

## 2021-03-31 ENCOUNTER — Encounter: Payer: Self-pay | Admitting: Family

## 2021-03-31 ENCOUNTER — Ambulatory Visit (INDEPENDENT_AMBULATORY_CARE_PROVIDER_SITE_OTHER): Payer: 59 | Admitting: Family

## 2021-03-31 VITALS — BP 126/88 | HR 95 | Temp 98.2°F | Resp 16 | Ht 65.5 in | Wt 166.0 lb

## 2021-03-31 DIAGNOSIS — B351 Tinea unguium: Secondary | ICD-10-CM

## 2021-03-31 DIAGNOSIS — N939 Abnormal uterine and vaginal bleeding, unspecified: Secondary | ICD-10-CM

## 2021-03-31 DIAGNOSIS — E785 Hyperlipidemia, unspecified: Secondary | ICD-10-CM | POA: Diagnosis not present

## 2021-03-31 DIAGNOSIS — G5601 Carpal tunnel syndrome, right upper limb: Secondary | ICD-10-CM

## 2021-03-31 DIAGNOSIS — E1059 Type 1 diabetes mellitus with other circulatory complications: Secondary | ICD-10-CM

## 2021-03-31 DIAGNOSIS — N926 Irregular menstruation, unspecified: Secondary | ICD-10-CM

## 2021-03-31 LAB — CBC WITH DIFFERENTIAL/PLATELET
Basophils Absolute: 0 K/uL (ref 0.0–0.1)
Basophils Relative: 0.8 % (ref 0.0–3.0)
Eosinophils Absolute: 0.1 K/uL (ref 0.0–0.7)
Eosinophils Relative: 1.8 % (ref 0.0–5.0)
HCT: 31.3 % — ABNORMAL LOW (ref 36.0–46.0)
Hemoglobin: 10.2 g/dL — ABNORMAL LOW (ref 12.0–15.0)
Lymphocytes Relative: 33.7 % (ref 12.0–46.0)
Lymphs Abs: 1.1 K/uL (ref 0.7–4.0)
MCHC: 32.7 g/dL (ref 30.0–36.0)
MCV: 94.6 fl (ref 78.0–100.0)
Monocytes Absolute: 0.2 K/uL (ref 0.1–1.0)
Monocytes Relative: 7.2 % (ref 3.0–12.0)
Neutro Abs: 1.9 K/uL (ref 1.4–7.7)
Neutrophils Relative %: 56.5 % (ref 43.0–77.0)
Platelets: 266 K/uL (ref 150.0–400.0)
RBC: 3.3 Mil/uL — ABNORMAL LOW (ref 3.87–5.11)
RDW: 14 % (ref 11.5–15.5)
WBC: 3.3 K/uL — ABNORMAL LOW (ref 4.0–10.5)

## 2021-03-31 LAB — LIPID PANEL
Cholesterol: 224 mg/dL — ABNORMAL HIGH (ref 0–200)
HDL: 60.1 mg/dL
NonHDL: 164.39
Total CHOL/HDL Ratio: 4
Triglycerides: 202 mg/dL — ABNORMAL HIGH (ref 0.0–149.0)
VLDL: 40.4 mg/dL — ABNORMAL HIGH (ref 0.0–40.0)

## 2021-03-31 LAB — HEMOGLOBIN A1C: Hgb A1c MFr Bld: 12.6 % — ABNORMAL HIGH (ref 4.6–6.5)

## 2021-03-31 LAB — MICROALBUMIN / CREATININE URINE RATIO
Creatinine,U: 136.3 mg/dL
Microalb Creat Ratio: 9.6 mg/g (ref 0.0–30.0)
Microalb, Ur: 13.1 mg/dL — ABNORMAL HIGH (ref 0.0–1.9)

## 2021-03-31 LAB — LDL CHOLESTEROL, DIRECT: Direct LDL: 117 mg/dL

## 2021-03-31 NOTE — Progress Notes (Signed)
Subjective:   By signing my name below, I, [name], attest that this documentation has been prepared under the direction and in the presence of [name], NP, 03/31/2021   Patient ID: Abigail Moran, female    DOB: [birth year], 45 y.o.   MRN: [id number]  Chief Complaint  Patient presents with   Diabetes    Here for follow up   Menorrhagia    Patient complains of vaginal bleeding for about 2 weeks    HPI Patient is in today for an office visit.  Prolonged period: She complains of a prolonged period that has lasted about two weeks. Her normal menstruation normally lasts about 4 days so this is abnormal for her.  Sexual activity: She is not currently sexually active and has not been for a long time.  Podiatry request: She is requesting to see a podiatrist for concerns with her feet. She is experiencing some toenails fungus and dried skin on her feet.  Hand pain: She notes hand pain specifically in her left hand. She has trouble fully extending her fingers to be straight but is able to in her right hand. 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  Low iron: She has a history of low iron and gets infusions upstairs. She is not sure if she has another infusion set up.  Blood sugar: She notes she has not been working much on lowering her blood sugar. She sees another physician to manage her blood sugar as well as a dietician. Immunizations: She is not interested in getting her flu shot at this time.  Medications: She takes 20 units of [place] currently.   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   Heart attack ([place]) 05/04/15   History of DVT (deep vein thrombosis)    following hospitalization 2020   History of kidney stones    Menorrhagia with irregular cycle 10/11/2020   Renal disorder    Vitamin D deficiency 09/14/2014    Past Surgical History:  Procedure Laterality Date   CARDIAC CATHETERIZATION  2016   CHOLECYSTECTOMY  2001   WISDOM TOOTH EXTRACTION      Family History  Problem Relation Age of Onset   Diabetes Father    Hypertension Father    Hyperlipidemia Father    Liver cancer Maternal Grandfather    Kidney disease Neg Hx    Heart disease Neg Hx    Colon polyps Neg Hx    Colon cancer Neg Hx    Stomach cancer Neg Hx    Rectal cancer Neg Hx     Social History   Socioeconomic History   Marital status: Single    Spouse name: Not on file   Number of children: Not on file   Years of education: Not on file   Highest education level: Not on file  Occupational History   Not on file  Tobacco Use   Smoking status: Never   Smokeless tobacco: Never  Vaping Use   Vaping Use: Never used  Substance and Sexual Activity   Alcohol use: Not Currently    Comment: 3 x per month   Drug use: Not Currently    Frequency: 2.0 times per week    Types: Marijuana   Sexual activity: Not Currently  Other Topics Concern   Not on file  Social History Narrative   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     blood glucose meter kit and  supplies KIT Dispense based on patient and insurance preference. Use up to four times daily as directed. E11.9 1 each 0   glucose blood (FREESTYLE LITE) test strip USE FOUR TIMES DAILY 300 strip 3   insulin aspart (FIASP FLEXTOUCH) 100 UNIT/ML FlexTouch Pen Inject 6-10 Units into the skin 3 (three) times daily between meals. 30 mL 3   insulin degludec (TRESIBA FLEXTOUCH) 200 UNIT/ML FlexTouch Pen Inject 34-36 Units into the skin daily. 9 mL 3   Insulin Pen Needle 32G X 4 MM MISC Use 4x a day 300 each 3   Lancets (FREESTYLE) lancets Use 4x a day 300 each 11   atorvastatin (LIPITOR) 10 MG tablet Take 1 tablet (10 mg total) by mouth daily. (Patient not taking: No sig reported) 90 tablet 1   Ferrous Sulfate (IRON) 325 (65 Fe) MG TABS Take 1 tablet (325 mg total) by mouth in the morning and at bedtime. 30 tablet 0   No facility-administered medications prior to visit.    Allergies  Allergen Reactions   Latex Itching   Tape Hives    Review of Systems  Genitourinary:        (+) prolonged menstrual period   Musculoskeletal:        (+) joint pain in right hand   Skin:        (+) toenail fungus  (+) dry skin on feet      Objective:    Physical Exam Constitutional:      General: She is not in acute distress.    Appearance: Normal appearance. She is not ill-appearing.  HENT:     Head: Normocephalic and atraumatic.     Right Ear: External ear normal.     Left Ear: External ear normal.  Eyes:     Extraocular Movements: Extraocular movements intact.     Pupils: Pupils are equal, round, and reactive to light.  Cardiovascular:     Rate and Rhythm: Normal rate and regular rhythm.     Heart sounds: Normal heart sounds. No murmur heard.   No gallop.  Pulmonary:     Effort: Pulmonary effort is normal. No respiratory distress.     Breath sounds: Normal breath sounds. No wheezing or rales.  Musculoskeletal:     Comments: (+) thenar wasting in right hand  Lymphadenopathy:     Cervical: No  cervical adenopathy.  Skin:    General: Skin is warm and dry.  Comments: (+) toenail fungus  (+) dry skin  Neurological:     Mental Status: She is alert.  Psychiatric:        Behavior: Behavior normal.        Judgment: Judgment normal.    BP 126/88 (BP Location: Right Arm, Patient Position: Sitting, Cuff Size: Small)   Pulse 95   Temp 98.2 F (36.8 C) (Oral)   Resp 16   Ht 5' 5.5" (1.664 m)   Wt 166 lb (75.3 kg)   SpO2 100%   BMI 27.20 kg/m  Wt Readings from Last 3 Encounters:  03/31/21 166 lb (75.3 kg)  11/01/20 191 lb (86.6 kg)  10/14/20 187 lb 3.2 oz (84.9 kg)       Assessment & Plan:   Problem List Items Addressed This Visit       Unprioritized   Type 1 diabetes mellitus with other circulatory complications (HCC)    Lab Results  Component Value Date   HGBA1C 10.7 (H) 08/31/2020   HGBA1C 9.9 (H) 03/18/2020   HGBA1C 10.6 Repeated and verified X2. (H) 12/22/2019   Lab Results  Component Value Date   MICROALBUR 4.8 (H) 12/22/2019   LDLCALC 93 01/29/2020   CREATININE 1.28 (H) 10/10/2020  She is past due for follow up with her endocrinologist. Advised pt to schedule follow up.       Relevant Orders   Hemoglobin A1c   Urine Microalbumin w/creat. ratio   Onychomycosis    New. Will refer to podiatry at pt request.       Relevant Orders   Ambulatory referral to Podiatry   Menstrual bleeding problem - Primary    She has a GYN. Reports no chance of pregnancy. Check CBC and advised pt to schedule follow up with her GYN.       Relevant Orders   CBC with Differential/Platelet   Hyperlipidemia   Relevant Orders   Lipid panel   Carpal tunnel syndrome    Symptoms have worsened. She braids hair for a living. She is interested in surgical referral. Referral placed.       Relevant Orders   Ambulatory referral to Hand Surgery   No orders of the defined types were placed in this encounter.   I, [name], NP, personally preformed the services  described in this documentation.  All medical record entries made by the scribe were at my direction and in my presence.  I have reviewed the chart and discharge instructions (if applicable) and agree that the record reflects my personal performance and is accurate and complete. 03/31/2021  I,[name],acting as a scribe for Nance Pear, NP.,have documented all relevant documentation on the behalf of Nance Pear, NP,as directed by  Nance Pear, NP while in the presence of Nance Pear, NP.  Nance Pear, NP

## 2021-03-31 NOTE — Assessment & Plan Note (Addendum)
Lab Results  Component Value Date   HGBA1C 10.7 (H) 08/31/2020   HGBA1C 9.9 (H) 03/18/2020   HGBA1C 10.6 Repeated and verified X2. (H) 12/22/2019   Lab Results  Component Value Date   MICROALBUR 4.8 (H) 12/22/2019   LDLCALC 93 01/29/2020   CREATININE 1.28 (H) 10/10/2020   She is past due for follow up with her endocrinologist. Advised pt to schedule follow up.

## 2021-03-31 NOTE — Assessment & Plan Note (Signed)
New. Will refer to podiatry at pt request.

## 2021-03-31 NOTE — Assessment & Plan Note (Signed)
Symptoms have worsened. She braids hair for a living. She is interested in surgical referral. Referral placed.

## 2021-03-31 NOTE — Assessment & Plan Note (Signed)
She has a GYN. Reports no chance of pregnancy. Check CBC and advised pt to schedule follow up with her GYN.

## 2021-03-31 NOTE — Patient Instructions (Addendum)
Please schedule a follow up appointment with hematology and Endocrinology (Dr. Elvera Lennox)

## 2021-04-02 ENCOUNTER — Telehealth: Payer: Self-pay | Admitting: Family

## 2021-04-02 ENCOUNTER — Other Ambulatory Visit: Payer: Self-pay | Admitting: Family

## 2021-04-02 MED ORDER — ATORVASTATIN CALCIUM 10 MG PO TABS: 10.0000 mg | ORAL_TABLET | Freq: Every day | ORAL | 1 refills | Status: DC

## 2021-04-02 NOTE — Telephone Encounter (Signed)
Please advise pt that cholesterol has risen. Looks like she has been out of her atorvastatin. I sent a refill. She should restart.  a1C is very high.  Needs to schedule follow up with her endocrinologist ASAP.  She is still anemic, but it is better than last time we checked. Following through with the GYN consult is important for her.

## 2021-04-03 NOTE — Telephone Encounter (Signed)
Spoke w/ Pt- informed of results and recommendations. Pt verbalized understanding.  

## 2021-05-01 ENCOUNTER — Encounter: Payer: Self-pay | Admitting: Hematology & Oncology

## 2021-05-01 ENCOUNTER — Other Ambulatory Visit: Payer: Self-pay

## 2021-05-01 ENCOUNTER — Encounter (HOSPITAL_BASED_OUTPATIENT_CLINIC_OR_DEPARTMENT_OTHER): Payer: Self-pay | Admitting: *Deleted

## 2021-05-01 ENCOUNTER — Emergency Department (HOSPITAL_BASED_OUTPATIENT_CLINIC_OR_DEPARTMENT_OTHER)
Admission: EM | Admit: 2021-05-01 | Discharge: 2021-05-01 | Disposition: A | Discharge status: Home / Self Care | Payer: 59 | Attending: Emergency Medicine | Admitting: Emergency Medicine

## 2021-05-01 DIAGNOSIS — R739 Hyperglycemia, unspecified: Secondary | ICD-10-CM

## 2021-05-01 DIAGNOSIS — A599 Trichomoniasis, unspecified: Secondary | ICD-10-CM

## 2021-05-01 DIAGNOSIS — Z951 Presence of aortocoronary bypass graft: Secondary | ICD-10-CM | POA: Insufficient documentation

## 2021-05-01 DIAGNOSIS — R112 Nausea with vomiting, unspecified: Secondary | ICD-10-CM | POA: Insufficient documentation

## 2021-05-01 DIAGNOSIS — I2510 Atherosclerotic heart disease of native coronary artery without angina pectoris: Secondary | ICD-10-CM | POA: Insufficient documentation

## 2021-05-01 DIAGNOSIS — R52 Pain, unspecified: Secondary | ICD-10-CM

## 2021-05-01 DIAGNOSIS — Z7982 Long term (current) use of aspirin: Secondary | ICD-10-CM | POA: Insufficient documentation

## 2021-05-01 DIAGNOSIS — Z79899 Other long term (current) drug therapy: Secondary | ICD-10-CM | POA: Insufficient documentation

## 2021-05-01 DIAGNOSIS — R109 Unspecified abdominal pain: Secondary | ICD-10-CM | POA: Insufficient documentation

## 2021-05-01 DIAGNOSIS — Z91040 Latex allergy status: Secondary | ICD-10-CM | POA: Insufficient documentation

## 2021-05-01 DIAGNOSIS — I509 Heart failure, unspecified: Secondary | ICD-10-CM | POA: Insufficient documentation

## 2021-05-01 DIAGNOSIS — E109 Type 1 diabetes mellitus without complications: Secondary | ICD-10-CM | POA: Insufficient documentation

## 2021-05-01 DIAGNOSIS — Z794 Long term (current) use of insulin: Secondary | ICD-10-CM | POA: Insufficient documentation

## 2021-05-01 LAB — COMPREHENSIVE METABOLIC PANEL
ALT: 28 U/L (ref 0–44)
AST: 31 U/L (ref 15–41)
Albumin: 3.9 g/dL (ref 3.5–5.0)
Alkaline Phosphatase: 97 U/L (ref 38–126)
Anion gap: 15 (ref 5–15)
BUN: 18 mg/dL (ref 6–20)
CO2: 19 mmol/L — ABNORMAL LOW (ref 22–32)
Calcium: 9 mg/dL (ref 8.9–10.3)
Chloride: 94 mmol/L — ABNORMAL LOW (ref 98–111)
Creatinine, Ser: 1.38 mg/dL — ABNORMAL HIGH (ref 0.44–1.00)
GFR, Estimated: 48 mL/min — ABNORMAL LOW (ref >60)
Glucose, Bld: 283 mg/dL — ABNORMAL HIGH (ref 70–99)
Potassium: 3.5 mmol/L (ref 3.5–5.1)
Sodium: 128 mmol/L — ABNORMAL LOW (ref 135–145)
Total Bilirubin: 0.5 mg/dL (ref 0.3–1.2)
Total Protein: 7.4 g/dL (ref 6.5–8.1)

## 2021-05-01 LAB — CBC WITH DIFFERENTIAL/PLATELET
Abs Immature Granulocytes: 0.02 10*3/uL (ref 0.00–0.07)
Basophils Absolute: 0 10*3/uL (ref 0.0–0.1)
Basophils Relative: 1 %
Eosinophils Absolute: 0.1 10*3/uL (ref 0.0–0.5)
Eosinophils Relative: 3 %
HCT: 37.3 % (ref 36.0–46.0)
Hemoglobin: 12.8 g/dL (ref 12.0–15.0)
Immature Granulocytes: 1 %
Lymphocytes Relative: 44 %
Lymphs Abs: 2 10*3/uL (ref 0.7–4.0)
MCH: 31.1 pg (ref 26.0–34.0)
MCHC: 34.3 g/dL (ref 30.0–36.0)
MCV: 90.8 fL (ref 80.0–100.0)
Monocytes Absolute: 0.4 10*3/uL (ref 0.1–1.0)
Monocytes Relative: 8 %
Neutro Abs: 1.9 10*3/uL (ref 1.7–7.7)
Neutrophils Relative %: 43 %
Platelets: 277 10*3/uL (ref 150–400)
RBC: 4.11 MIL/uL (ref 3.87–5.11)
RDW: 12.7 % (ref 11.5–15.5)
WBC: 4.4 10*3/uL (ref 4.0–10.5)
nRBC: 0 % (ref 0.0–0.2)

## 2021-05-01 LAB — URINALYSIS, ROUTINE W REFLEX MICROSCOPIC
Bilirubin Urine: NEGATIVE
Glucose, UA: >=500 mg/dL — AB
Hgb urine dipstick: NEGATIVE
Ketones, ur: 15 mg/dL — AB
Nitrite: NEGATIVE
Protein, ur: NEGATIVE mg/dL
Specific Gravity, Urine: 1.015 (ref 1.005–1.030)
pH: 5.5 (ref 5.0–8.0)

## 2021-05-01 LAB — RAPID URINE DRUG SCREEN, HOSP PERFORMED
Amphetamines: NOT DETECTED
Barbiturates: NOT DETECTED
Benzodiazepines: NOT DETECTED
Cocaine: NOT DETECTED
Opiates: NOT DETECTED
Tetrahydrocannabinol: POSITIVE — AB

## 2021-05-01 LAB — URINALYSIS, MICROSCOPIC (REFLEX): RBC / HPF: NONE SEEN RBC/hpf (ref 0–5)

## 2021-05-01 LAB — CBG MONITORING, ED: Glucose-Capillary: 279 mg/dL — ABNORMAL HIGH (ref 70–99)

## 2021-05-01 LAB — PREGNANCY, URINE: Preg Test, Ur: NEGATIVE

## 2021-05-01 LAB — TROPONIN I (HIGH SENSITIVITY): Troponin I (High Sensitivity): 13 ng/L (ref <18)

## 2021-05-01 MED ORDER — SODIUM CHLORIDE 0.9 % IV SOLN: INTRAVENOUS | Status: DC

## 2021-05-01 MED ORDER — NAPROXEN 375 MG PO TABS: 375.0000 mg | ORAL_TABLET | Freq: Two times a day (BID) | ORAL | 0 refills | Status: DC

## 2021-05-01 MED ORDER — SODIUM CHLORIDE 0.9 % IV BOLUS
500.0000 mL | Freq: Once | INTRAVENOUS | Status: AC
  Administered 2021-05-01: 500 mL via INTRAVENOUS

## 2021-05-01 MED ORDER — LIDOCAINE 5 % EX PTCH
3.0000 | MEDICATED_PATCH | CUTANEOUS | Status: DC
  Administered 2021-05-01: 3 via TRANSDERMAL
  Filled 2021-05-01: qty 3

## 2021-05-01 MED ORDER — FAMOTIDINE 20 MG PO TABS: 20.0000 mg | ORAL_TABLET | Freq: Two times a day (BID) | ORAL | 0 refills | Status: DC

## 2021-05-01 MED ORDER — ONDANSETRON HCL 4 MG/2ML IJ SOLN
4.0000 mg | Freq: Once | INTRAMUSCULAR | Status: AC
  Administered 2021-05-01: 4 mg via INTRAVENOUS
  Filled 2021-05-01: qty 2

## 2021-05-01 MED ORDER — KETOROLAC TROMETHAMINE 30 MG/ML IJ SOLN
15.0000 mg | Freq: Once | INTRAMUSCULAR | Status: AC
  Administered 2021-05-01: 15 mg via INTRAVENOUS
  Filled 2021-05-01: qty 1

## 2021-05-01 MED ORDER — METRONIDAZOLE 500 MG PO TABS
2000.0000 mg | ORAL_TABLET | Freq: Once | ORAL | Status: AC
  Administered 2021-05-01: 2000 mg via ORAL
  Filled 2021-05-01: qty 4

## 2021-05-01 MED ORDER — LIDOCAINE 5 % EX PTCH: 1.0000 | MEDICATED_PATCH | CUTANEOUS | 0 refills | Status: DC

## 2021-05-01 MED ORDER — ONDANSETRON 8 MG PO TBDP
ORAL_TABLET | ORAL | 0 refills | Status: DC
Start: 2021-05-01 — End: 2021-05-15

## 2021-05-01 MED ORDER — INSULIN ASPART 100 UNIT/ML IJ SOLN
2.0000 [IU] | Freq: Once | INTRAMUSCULAR | Status: AC
  Administered 2021-05-01: 2 [IU] via SUBCUTANEOUS

## 2021-05-01 MED ORDER — SODIUM CHLORIDE 0.9 % IV BOLUS: 1000.0000 mL | Freq: Once | INTRAVENOUS | Status: DC

## 2021-05-01 NOTE — ED Triage Notes (Signed)
Pt reports she had n/v from 10/12-10/16 which has resolved. States since then she has had generalized abdominal pain and decreased appetite. States she is a diabetic but hasn't been checking her blood sugar

## 2021-05-01 NOTE — ED Provider Notes (Signed)
[facility] EMERGENCY DEPARTMENT Provider Note   CSN: [id number] Arrival date & time: 05/01/21  0040     History Chief Complaint  Patient presents with   Abdominal Pain    Abigail Moran is a 45 y.o. female.  The history is provided by the patient.  Illness Location:  Total body pain of the entire right side of the body Quality:  Painful also nausea and vomiting Severity:  Moderate Onset quality:  Gradual Duration:  3 weeks Timing:  Constant Progression:  Unchanged Chronicity:  New Context:  Diabetic, not checking sugars at home Relieved by:  Nothing Worsened by:  Nothing Ineffective treatments:  None Associated symptoms: nausea and vomiting   Associated symptoms: no abdominal pain, no congestion, no cough, no diarrhea, no ear pain, no fatigue, no fever, no headaches, no loss of consciousness, no rash, no rhinorrhea, no shortness of breath, no sore throat and no wheezing   Patient with T 1 DM not checking sugars presnts with nausea and emesis and total right sided body pain from thighs to clavicle.  Has not taken any medication for this.  No DOE, no SOB.  No leg pain.  No travel.  No OCP.  Denies sexual activity.      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   Heart attack ([place]) 05/04/15   History of DVT (deep vein thrombosis)    following hospitalization 2020   History of kidney stones    Menorrhagia with irregular cycle 10/11/2020   Renal disorder    Vitamin D deficiency 09/14/2014    Patient Active Problem List   Diagnosis Date Noted   Onychomycosis 03/31/2021   Menstrual bleeding problem 03/31/2021   Hyperlipidemia 03/31/2021   Menorrhagia with irregular cycle 10/11/2020   Erythropoietin  deficiency anemia 10/11/2020   Iron deficiency anemia due to chronic blood loss 09/28/2020   Renal insufficiency    Gastroparesis 08/08/2016   Carpal tunnel syndrome 08/08/2016   CAD (coronary artery disease) 06/07/2015   CHF (congestive heart failure) ([place]) 06/07/2015   Anemia 10/11/2014   Preventative health care 09/27/2014   Vitamin D deficiency 09/14/2014   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     Family History  Problem Relation Age of Onset   Diabetes Father    Hypertension Father    Hyperlipidemia Father    Liver cancer Maternal Grandfather    Kidney disease Neg Hx    Heart disease Neg Hx    Colon polyps Neg Hx    Colon cancer Neg Hx    Stomach cancer Neg Hx    Rectal cancer Neg Hx     Social History   Tobacco Use   Smoking status: Never   Smokeless tobacco: Never  Vaping Use   Vaping Use: Never used  Substance Use Topics   Alcohol use: Not Currently    Comment: 3 x per month   Drug use: Not Currently  Frequency: 2.0 times per week    Types: Marijuana    Home Medications Prior to Admission medications   Medication Sig Start Date End Date Taking? Authorizing Provider  lidocaine (LIDODERM) 5 % Place 1 patch onto the skin daily. Remove & Discard patch within 12 hours or as directed by MD 05/01/21  Yes Haedyn Breau, MD  naproxen (NAPROSYN) 375 MG tablet Take 1 tablet (375 mg total) by mouth 2 (two) times daily. 05/01/21  Yes Tymier Lindholm, MD  aspirin EC 81 MG tablet Take 81 mg by mouth daily.    [provider]  atorvastatin (LIPITOR) 10 MG tablet Take 1 tablet (10 mg total) by mouth daily. 04/02/21   [name], NP  blood glucose meter kit and supplies KIT Dispense based on patient and insurance preference.  Use up to four times daily as directed. E11.9 09/28/20   [name], NP  glucose blood (FREESTYLE LITE) test strip USE FOUR TIMES DAILY 10/14/20   Philemon Kingdom, MD  insulin aspart (FIASP FLEXTOUCH) 100 UNIT/ML FlexTouch Pen Inject 6-10 Units into the skin 3 (three) times daily between meals. 10/14/20   Philemon Kingdom, MD  insulin degludec (TRESIBA FLEXTOUCH) 200 UNIT/ML FlexTouch Pen Inject 34-36 Units into the skin daily. 10/14/20   Philemon Kingdom, MD  Insulin Pen Needle 32G X 4 MM MISC Use 4x a day 10/14/20   Philemon Kingdom, MD  Lancets (FREESTYLE) lancets Use 4x a day 09/28/20   [name], NP    Allergies    Latex and Tape  Review of Systems   Review of Systems  Constitutional:  Negative for fatigue and fever.  HENT:  Negative for congestion, ear pain, rhinorrhea and sore throat.   Eyes:  Negative for redness.  Respiratory:  Negative for cough, shortness of breath and wheezing.   Cardiovascular:  Negative for palpitations and leg swelling.  Gastrointestinal:  Positive for nausea and vomiting. Negative for abdominal pain and diarrhea.  Genitourinary:  Negative for difficulty urinating.  Musculoskeletal:  Negative for neck pain and neck stiffness.  Skin:  Negative for rash.  Neurological:  Negative for loss of consciousness and headaches.  Psychiatric/Behavioral:  Negative for agitation.    Physical Exam Updated Vital Signs BP 102/74   Pulse 87   Temp 98.4 F (36.9 C) (Oral)   Resp 10   Ht 5' 5.5" (1.664 m)   Wt 70.8 kg   LMP 03/21/2021 (Approximate)   SpO2 100%   BMI 25.56 kg/m   Physical Exam Vitals and nursing note reviewed. Exam conducted with a chaperone present.  Constitutional:      General: She is not in acute distress.    Appearance: Normal appearance.  HENT:     Head: Normocephalic and atraumatic.     Nose: Nose normal.     Mouth/Throat:     Mouth: Mucous membranes are moist.  Eyes:     Extraocular Movements: Extraocular movements  intact.     Conjunctiva/sclera: Conjunctivae normal.     Pupils: Pupils are equal, round, and reactive to light.  Cardiovascular:     Rate and Rhythm: Normal rate and regular rhythm.     Pulses: Normal pulses.     Heart sounds: Normal heart sounds.  Pulmonary:     Effort: Pulmonary effort is normal.     Breath sounds: Normal breath sounds.  Chest:     Chest wall: Tenderness present.  Abdominal:     General: Abdomen is flat. Bowel sounds are normal.  Palpations: Abdomen is soft. There is no mass.     Tenderness: There is no abdominal tenderness. There is no guarding or rebound.     Hernia: No hernia is present.  Musculoskeletal:        General: No tenderness. Normal range of motion.     Cervical back: Normal range of motion and neck supple. No rigidity or tenderness.     Right lower leg: No edema.     Left lower leg: No edema.  Lymphadenopathy:     Cervical: No cervical adenopathy.  Skin:    General: Skin is warm and dry.     Capillary Refill: Capillary refill takes less than 2 seconds.  Neurological:     General: No focal deficit present.     Mental Status: She is alert and oriented to person, place, and time.     Deep Tendon Reflexes: Reflexes normal.  Psychiatric:        Mood and Affect: Mood normal.        Behavior: Behavior normal.    ED Results / Procedures / Treatments   Labs (all labs ordered are listed, but only abnormal results are displayed) Results for orders placed or performed during the hospital encounter of 05/01/21  CBC with Differential/Platelet  Result Value Ref Range   WBC 4.4 4.0 - 10.5 K/uL   RBC 4.11 3.87 - 5.11 MIL/uL   Hemoglobin 12.8 12.0 - 15.0 g/dL   HCT 37.3 36.0 - 46.0 %   MCV 90.8 80.0 - 100.0 fL   MCH 31.1 26.0 - 34.0 pg   MCHC 34.3 30.0 - 36.0 g/dL   RDW 12.7 11.5 - 15.5 %   Platelets 277 150 - 400 K/uL   nRBC 0.0 0.0 - 0.2 %   Neutrophils Relative % 43 %   Neutro Abs 1.9 1.7 - 7.7 K/uL   Lymphocytes Relative 44 %   Lymphs Abs  2.0 0.7 - 4.0 K/uL   Monocytes Relative 8 %   Monocytes Absolute 0.4 0.1 - 1.0 K/uL   Eosinophils Relative 3 %   Eosinophils Absolute 0.1 0.0 - 0.5 K/uL   Basophils Relative 1 %   Basophils Absolute 0.0 0.0 - 0.1 K/uL   Immature Granulocytes 1 %   Abs Immature Granulocytes 0.02 0.00 - 0.07 K/uL  Comprehensive metabolic panel  Result Value Ref Range   Sodium 128 (L) 135 - 145 mmol/L   Potassium 3.5 3.5 - 5.1 mmol/L   Chloride 94 (L) 98 - 111 mmol/L   CO2 19 (L) 22 - 32 mmol/L   Glucose, Bld 283 (H) 70 - 99 mg/dL   BUN 18 6 - 20 mg/dL   Creatinine, Ser 1.38 (H) 0.44 - 1.00 mg/dL   Calcium 9.0 8.9 - 10.3 mg/dL   Total Protein 7.4 6.5 - 8.1 g/dL   Albumin 3.9 3.5 - 5.0 g/dL   AST 31 15 - 41 U/L   ALT 28 0 - 44 U/L   Alkaline Phosphatase 97 38 - 126 U/L   Total Bilirubin 0.5 0.3 - 1.2 mg/dL   GFR, Estimated 48 (L) >60 mL/min   Anion gap 15 5 - 15  Urinalysis, Routine w reflex microscopic Urine, Clean Catch  Result Value Ref Range   Color, Urine YELLOW YELLOW   APPearance CLEAR CLEAR   Specific Gravity, Urine 1.015 1.005 - 1.030   pH 5.5 5.0 - 8.0   Glucose, UA >=500 (A) NEGATIVE mg/dL   Hgb urine dipstick NEGATIVE NEGATIVE  Bilirubin Urine NEGATIVE NEGATIVE   Ketones, ur 15 (A) NEGATIVE mg/dL   Protein, ur NEGATIVE NEGATIVE mg/dL   Nitrite NEGATIVE NEGATIVE   Leukocytes,Ua SMALL (A) NEGATIVE  Pregnancy, urine  Result Value Ref Range   Preg Test, Ur NEGATIVE NEGATIVE  Rapid urine drug screen (hospital performed)  Result Value Ref Range   Opiates NONE DETECTED NONE DETECTED   Cocaine NONE DETECTED NONE DETECTED   Benzodiazepines NONE DETECTED NONE DETECTED   Amphetamines NONE DETECTED NONE DETECTED   Tetrahydrocannabinol POSITIVE (A) NONE DETECTED   Barbiturates NONE DETECTED NONE DETECTED  Urinalysis, Microscopic (reflex)  Result Value Ref Range   RBC / HPF NONE SEEN 0 - 5 RBC/hpf   WBC, UA 6-10 0 - 5 WBC/hpf   Bacteria, UA FEW (A) NONE SEEN   Squamous Epithelial /  LPF 0-5 0 - 5   Trichomonas, UA PRESENT (A) NONE SEEN  POC CBG, ED  Result Value Ref Range   Glucose-Capillary 279 (H) 70 - 99 mg/dL  Troponin I (High Sensitivity)  Result Value Ref Range   Troponin I (High Sensitivity) 13 <18 ng/L   No results found.  EKG EKG Interpretation  Date/Time:  Monday May 01 2021 01:04:39 EDT Ventricular Rate:  99 PR Interval:  129 QRS Duration: 80 QT Interval:  350 QTC Calculation: 450 R Axis:   77 Text Interpretation: Sinus rhythm Confirmed by Randal Buba, Arron Mcnaught ([id number]) on 05/01/2021 1:23:28 AM  Radiology No results found.  Procedures Procedures   Medications Ordered in ED Medications  ondansetron (ZOFRAN) injection 4 mg (has no administration in time range)  0.9 %  sodium chloride infusion ( Intravenous New Bag/Given 05/01/21 0238)  lidocaine (LIDODERM) 5 % 3 patch (3 patches Transdermal Patch Applied 05/01/21 0226)  metroNIDAZOLE (FLAGYL) tablet 2,000 mg (has no administration in time range)  sodium chloride 0.9 % bolus 500 mL (500 mLs Intravenous New Bag/Given 05/01/21 0133)  insulin aspart (novoLOG) injection 2 Units (2 Units Subcutaneous Given 05/01/21 0222)  ketorolac (TORADOL) 30 MG/ML injection 15 mg (15 mg Intravenous Given 05/01/21 0222)    ED Course  I have reviewed the triage vital signs and the nursing notes.  Pertinent labs & imaging results that were available during my care of the patient were reviewed by me and considered in my medical decision making (see chart for details).   Given time course of symptoms patient has ruled out for MI in the ED.  PERC negative wells 0 highly doubt PE in this low risk patient.  I suspect emesis is a combination of DM gastroparesis and marijuana inducted cyclic vomiting.  PO challenged in the ED and counseled against usage of marijuana.  No sexual activity of any kind until 7 days after all partners treated.  No signs of surgical abdomen.     Terrace Chiem was evaluated in Emergency  Department on 05/01/2021 for the symptoms described in the history of present illness. She was evaluated in the context of the global COVID-19 pandemic, which necessitated consideration that the patient might be at risk for infection with the SARS-CoV-2 virus that causes COVID-19. Institutional protocols and algorithms that pertain to the evaluation of patients at risk for COVID-19 are in a state of rapid change based on information released by regulatory bodies including the CDC and federal and state organizations. These policies and algorithms were followed during the patient's care in the ED.  Final Clinical Impression(s) / ED Diagnoses Final diagnoses:  None   Return for intractable cough, coughing up  blood, fevers > 100.4 unrelieved by medication, shortness of breath, intractable vomiting, chest pain, shortness of breath, weakness, numbness, changes in speech, facial asymmetry, abdominal pain, passing out, Inability to tolerate liquids or food, cough, altered mental status or any concerns. No signs of systemic illness or infection. The patient is nontoxic-appearing on exam and vital signs are within normal limits.  I have reviewed the triage vital signs and the nursing notes. Pertinent labs & imaging results that were available during my care of the patient were reviewed by me and considered in my medical decision making (see chart for details). After history, exam, and medical workup I feel the patient has been appropriately medically screened and is safe for discharge home. Pertinent diagnoses were discussed with the patient. Patient was given return precautions.  Rx / DC Orders ED Discharge Orders          Ordered    naproxen (NAPROSYN) 375 MG tablet  2 times daily        05/01/21 0308    lidocaine (LIDODERM) 5 %  Every 24 hours        05/01/21 0308             Arbie Reisz, MD 05/01/21 [phone]

## 2021-05-08 ENCOUNTER — Ambulatory Visit: Payer: Self-pay | Admitting: Family

## 2021-05-09 ENCOUNTER — Other Ambulatory Visit: Payer: Self-pay

## 2021-05-09 ENCOUNTER — Ambulatory Visit (INDEPENDENT_AMBULATORY_CARE_PROVIDER_SITE_OTHER): Payer: Self-pay | Admitting: Family

## 2021-05-09 DIAGNOSIS — E1065 Type 1 diabetes mellitus with hyperglycemia: Secondary | ICD-10-CM

## 2021-05-09 DIAGNOSIS — M25511 Pain in right shoulder: Secondary | ICD-10-CM

## 2021-05-09 NOTE — Progress Notes (Signed)
Subjective:   By signing my name below, I, Abigail Moran, attest that this documentation has been prepared under the direction and in the presence of Abigail Alar, NP, 05/09/2021   Patient ID: Abigail Moran, female    DOB: 08-25-1975, 45 y.o.   MRN: [id number]  Chief Complaint  Patient presents with   Follow-up    Here for er follow up    HPI Patient is in today for an ED follow-up.  ED visit: She visited the ED on 05/01/2021 for right sided shoulder pain. She notes the pain was most severe in her right shoulder and neck. She has experienced pain like this before but it usually did not last as long as this episode. She started experiencing this pain on 04/19/2021 and she notes she was "down" from the pain until yesterday when the pain subsided. During the ED visit, they prescribed her 5% lidoderm patches and gave her a dose of toradol which helped.   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   Menstrual concerns: During her last visit she complained of a prolonged period. She was then recommended to go see her gynecologist. She has not yet made that appointment but reports that her blood count was normal in the ED.   STD exposure: She went on a trip out of town and had sex with a new partner. She shortly went to the ED with fear she was exposed to an STD. She was seen in the ED where she reports she was treated empirically for STDs. She is planning to follow up with her GYN for further evaluation.  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   Heart attack ([place]) 05/04/15   History of DVT (deep vein thrombosis)    following hospitalization 2020   History of kidney stones    Menorrhagia with irregular cycle 10/11/2020   Renal disorder    Vitamin D deficiency 09/14/2014    Past Surgical History:  Procedure Laterality Date   CARDIAC CATHETERIZATION  2016   CHOLECYSTECTOMY  2001   WISDOM TOOTH EXTRACTION      Family History  Problem Relation Age of Onset   Diabetes Father    Hypertension Father    Hyperlipidemia Father    Liver cancer Maternal Grandfather    Kidney disease Neg Hx    Heart disease Neg Hx    Colon polyps Neg Hx    Colon cancer Neg Hx    Stomach cancer Neg Hx    Rectal cancer Neg Hx     Social History   Socioeconomic History   Marital status: Single    Spouse name: Not on file   Number of children: Not on file   Years of education: Not on file   Highest education level: Not on file  Occupational History   Not on file  Tobacco Use   Smoking status: Never   Smokeless tobacco: Never  Vaping Use  Vaping Use: Never used  Substance and Sexual Activity   Alcohol use: Not Currently    Comment: 3 x per month   Drug use: Not Currently    Frequency: 2.0 times per week    Types: Marijuana   Sexual activity: Not Currently  Other Topics Concern   Not on file  Social History Narrative   Works as Probation officer    Lives with common law husband   No children   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     atorvastatin (LIPITOR) 10 MG tablet Take 1 tablet (10 mg total)  by mouth daily. 90 tablet 1   blood glucose meter kit and supplies KIT Dispense based on patient and insurance preference. Use up to four times daily as directed. E11.9 1 each 0   famotidine (PEPCID) 20 MG tablet Take 1 tablet (20 mg total) by mouth 2 (two) times daily. 30 tablet 0   glucose blood (FREESTYLE LITE) test strip USE FOUR TIMES DAILY 300 strip 3   insulin aspart (FIASP FLEXTOUCH) 100 UNIT/ML FlexTouch Pen Inject 6-10 Units into the skin 3 (three) times daily between meals. 30 mL 3   insulin degludec (TRESIBA FLEXTOUCH) 200 UNIT/ML FlexTouch Pen Inject 34-36 Units into the skin daily. 9 mL 3   Insulin Pen Needle 32G X 4 MM MISC Use 4x a day 300 each 3   Lancets (FREESTYLE) lancets Use 4x a day 300 each 11   lidocaine (LIDODERM) 5 % Place 1 patch onto the skin daily. Remove & Discard patch within 12 hours or as directed by MD 30 patch 0   naproxen (NAPROSYN) 375 MG tablet Take 1 tablet (375 mg total) by mouth 2 (two) times daily. 20 tablet 0   naproxen (NAPROSYN) 375 MG tablet Take 1 tablet (375 mg total) by mouth 2 (two) times daily with a meal. 14 tablet 0   ondansetron (ZOFRAN ODT) 8 MG disintegrating tablet 31m ODT q8 hours prn nausea 4 tablet 0   No facility-administered medications prior to visit.    Allergies  Allergen Reactions   Latex Itching   Tape Hives    ROS     Objective:    Physical Exam Constitutional:      General: She is not in acute distress.    Appearance: Normal appearance. She is not ill-appearing.  HENT:     Head: Normocephalic and atraumatic.     Right Ear: External ear normal.     Left Ear: External ear normal.  Eyes:     Extraocular Movements: Extraocular movements intact.     Pupils: Pupils are equal, round, and reactive to light.  Cardiovascular:     Rate and Rhythm: Normal rate and regular rhythm.     Heart sounds: Normal heart sounds. No murmur heard.   No gallop.  Pulmonary:     Effort: Pulmonary effort is normal. No respiratory  distress.     Breath sounds: Normal breath sounds. No wheezing or rales.  Lymphadenopathy:     Cervical: No cervical adenopathy.  Skin:    General: Skin is warm and dry.  Neurological:     Mental Status: She is alert and oriented to person, place, and time.  Psychiatric:        Behavior: Behavior normal.        Judgment: Judgment normal.    BP 91/61 (BP Location: Right Arm, Patient Position: Sitting, Cuff Size: Small)   Pulse 95   Temp 98.6 F (37 C) (Oral)   Resp 16   Wt 161 lb (73 kg)   SpO2 100%   BMI 26.38 kg/m  Wt Readings from Last 3 Encounters:  05/09/21 161 lb (73 kg)  05/01/21 156 lb (70.8 kg)  03/31/21 166 lb (75.3 kg)       Assessment & Plan:   Problem List Items Addressed This Visit       Unprioritized   Uncontrolled type 1 diabetes mellitus with hyperglycemia ([place])    Advised pt to keep her upcoming appointment with endocrinology.       Right shoulder pain    Improved. Likely musculoskeletal in nature. Monitor.       No orders of the defined types were placed in this encounter.  20 minutes spent on today's visit. The majority of this time was spent reviewing her chart and counseling patient.   I, Abigail Alar, NP, personally preformed the services described in this documentation.  All medical record entries made by the scribe were at my direction and in my presence.  I have reviewed the chart and discharge instructions (if applicable) and agree that the record reflects my personal performance and is accurate and complete. 05/09/2021  I,Abigail Moran,acting as a Education administrator for Nance Pear, NP.,have documented all relevant documentation on the behalf of Nance Pear, NP,as directed by  Nance Pear, NP while in the presence of Nance Pear, NP.  Nance Pear, NP

## 2021-05-09 NOTE — Assessment & Plan Note (Signed)
Advised pt to keep her upcoming appointment with endocrinology.

## 2021-05-09 NOTE — Assessment & Plan Note (Signed)
Improved. Likely musculoskeletal in nature. Monitor.

## 2021-05-14 ENCOUNTER — Encounter (HOSPITAL_BASED_OUTPATIENT_CLINIC_OR_DEPARTMENT_OTHER): Payer: Self-pay | Admitting: *Deleted

## 2021-05-14 ENCOUNTER — Inpatient Hospital Stay (HOSPITAL_BASED_OUTPATIENT_CLINIC_OR_DEPARTMENT_OTHER)
Admission: EM | Admit: 2021-05-14 | Discharge: 2021-05-21 | DRG: 872 | Disposition: A | Discharge status: Home / Self Care | Payer: Self-pay | Attending: Family Medicine | Admitting: Family Medicine

## 2021-05-14 ENCOUNTER — Other Ambulatory Visit: Payer: Self-pay

## 2021-05-14 ENCOUNTER — Emergency Department (HOSPITAL_BASED_OUTPATIENT_CLINIC_OR_DEPARTMENT_OTHER): Payer: Self-pay

## 2021-05-14 DIAGNOSIS — Z87442 Personal history of urinary calculi: Secondary | ICD-10-CM

## 2021-05-14 DIAGNOSIS — E1065 Type 1 diabetes mellitus with hyperglycemia: Secondary | ICD-10-CM | POA: Diagnosis present

## 2021-05-14 DIAGNOSIS — L02611 Cutaneous abscess of right foot: Secondary | ICD-10-CM | POA: Diagnosis present

## 2021-05-14 DIAGNOSIS — K3184 Gastroparesis: Secondary | ICD-10-CM | POA: Diagnosis present

## 2021-05-14 DIAGNOSIS — A599 Trichomoniasis, unspecified: Secondary | ICD-10-CM

## 2021-05-14 DIAGNOSIS — D631 Anemia in chronic kidney disease: Secondary | ICD-10-CM | POA: Diagnosis present

## 2021-05-14 DIAGNOSIS — E785 Hyperlipidemia, unspecified: Secondary | ICD-10-CM | POA: Diagnosis present

## 2021-05-14 DIAGNOSIS — I2510 Atherosclerotic heart disease of native coronary artery without angina pectoris: Secondary | ICD-10-CM | POA: Diagnosis present

## 2021-05-14 DIAGNOSIS — A419 Sepsis, unspecified organism: Secondary | ICD-10-CM

## 2021-05-14 DIAGNOSIS — J302 Other seasonal allergic rhinitis: Secondary | ICD-10-CM | POA: Diagnosis present

## 2021-05-14 DIAGNOSIS — E1022 Type 1 diabetes mellitus with diabetic chronic kidney disease: Secondary | ICD-10-CM | POA: Diagnosis present

## 2021-05-14 DIAGNOSIS — Z91138 Patient's unintentional underdosing of medication regimen for other reason: Secondary | ICD-10-CM

## 2021-05-14 DIAGNOSIS — R81 Glycosuria: Secondary | ICD-10-CM | POA: Diagnosis present

## 2021-05-14 DIAGNOSIS — L03115 Cellulitis of right lower limb: Secondary | ICD-10-CM | POA: Diagnosis present

## 2021-05-14 DIAGNOSIS — Z800 Family history of malignant neoplasm of digestive organs: Secondary | ICD-10-CM

## 2021-05-14 DIAGNOSIS — Z7982 Long term (current) use of aspirin: Secondary | ICD-10-CM

## 2021-05-14 DIAGNOSIS — Z8349 Family history of other endocrine, nutritional and metabolic diseases: Secondary | ICD-10-CM

## 2021-05-14 DIAGNOSIS — Z833 Family history of diabetes mellitus: Secondary | ICD-10-CM

## 2021-05-14 DIAGNOSIS — Z79899 Other long term (current) drug therapy: Secondary | ICD-10-CM

## 2021-05-14 DIAGNOSIS — Z9049 Acquired absence of other specified parts of digestive tract: Secondary | ICD-10-CM

## 2021-05-14 DIAGNOSIS — A4101 Sepsis due to Methicillin susceptible Staphylococcus aureus: Principal | ICD-10-CM | POA: Diagnosis present

## 2021-05-14 DIAGNOSIS — E1043 Type 1 diabetes mellitus with diabetic autonomic (poly)neuropathy: Secondary | ICD-10-CM | POA: Diagnosis present

## 2021-05-14 DIAGNOSIS — Z86718 Personal history of other venous thrombosis and embolism: Secondary | ICD-10-CM

## 2021-05-14 DIAGNOSIS — Z20822 Contact with and (suspected) exposure to covid-19: Secondary | ICD-10-CM | POA: Diagnosis present

## 2021-05-14 DIAGNOSIS — A5900 Urogenital trichomoniasis, unspecified: Secondary | ICD-10-CM | POA: Diagnosis present

## 2021-05-14 DIAGNOSIS — Z91040 Latex allergy status: Secondary | ICD-10-CM

## 2021-05-14 DIAGNOSIS — Z8249 Family history of ischemic heart disease and other diseases of the circulatory system: Secondary | ICD-10-CM

## 2021-05-14 DIAGNOSIS — E871 Hypo-osmolality and hyponatremia: Secondary | ICD-10-CM | POA: Diagnosis present

## 2021-05-14 DIAGNOSIS — T383X6A Underdosing of insulin and oral hypoglycemic [antidiabetic] drugs, initial encounter: Secondary | ICD-10-CM | POA: Diagnosis present

## 2021-05-14 DIAGNOSIS — Z794 Long term (current) use of insulin: Secondary | ICD-10-CM

## 2021-05-14 DIAGNOSIS — N1831 Chronic kidney disease, stage 3a: Secondary | ICD-10-CM | POA: Diagnosis present

## 2021-05-14 LAB — CBC WITH DIFFERENTIAL/PLATELET
Abs Immature Granulocytes: 0.08 10*3/uL — ABNORMAL HIGH (ref 0.00–0.07)
Basophils Absolute: 0 10*3/uL (ref 0.0–0.1)
Basophils Relative: 0 %
Eosinophils Absolute: 0 10*3/uL (ref 0.0–0.5)
Eosinophils Relative: 0 %
HCT: 30.5 % — ABNORMAL LOW (ref 36.0–46.0)
Hemoglobin: 9.9 g/dL — ABNORMAL LOW (ref 12.0–15.0)
Immature Granulocytes: 1 %
Lymphocytes Relative: 7 %
Lymphs Abs: 0.7 10*3/uL (ref 0.7–4.0)
MCH: 30.8 pg (ref 26.0–34.0)
MCHC: 32.5 g/dL (ref 30.0–36.0)
MCV: 95 fL (ref 80.0–100.0)
Monocytes Absolute: 0.7 10*3/uL (ref 0.1–1.0)
Monocytes Relative: 7 %
Neutro Abs: 8.4 10*3/uL — ABNORMAL HIGH (ref 1.7–7.7)
Neutrophils Relative %: 85 %
Platelets: 202 10*3/uL (ref 150–400)
RBC: 3.21 MIL/uL — ABNORMAL LOW (ref 3.87–5.11)
RDW: 13.8 % (ref 11.5–15.5)
WBC: 9.9 10*3/uL (ref 4.0–10.5)
nRBC: 0 % (ref 0.0–0.2)

## 2021-05-14 LAB — URINALYSIS, ROUTINE W REFLEX MICROSCOPIC
Bilirubin Urine: NEGATIVE
Glucose, UA: >=500 mg/dL — AB
Ketones, ur: 15 mg/dL — AB
Leukocytes,Ua: NEGATIVE
Nitrite: NEGATIVE
Protein, ur: NEGATIVE mg/dL
Specific Gravity, Urine: 1.01 (ref 1.005–1.030)
pH: 6.5 (ref 5.0–8.0)

## 2021-05-14 LAB — URINALYSIS, MICROSCOPIC (REFLEX)

## 2021-05-14 LAB — CBG MONITORING, ED: Glucose-Capillary: 533 mg/dL (ref 70–99)

## 2021-05-14 LAB — COMPREHENSIVE METABOLIC PANEL
ALT: 13 U/L (ref 0–44)
AST: 23 U/L (ref 15–41)
Albumin: 3.5 g/dL (ref 3.5–5.0)
Alkaline Phosphatase: 85 U/L (ref 38–126)
Anion gap: 11 (ref 5–15)
BUN: 21 mg/dL — ABNORMAL HIGH (ref 6–20)
CO2: 23 mmol/L (ref 22–32)
Calcium: 9 mg/dL (ref 8.9–10.3)
Chloride: 93 mmol/L — ABNORMAL LOW (ref 98–111)
Creatinine, Ser: 1.25 mg/dL — ABNORMAL HIGH (ref 0.44–1.00)
GFR, Estimated: 54 mL/min — ABNORMAL LOW (ref >60)
Glucose, Bld: 505 mg/dL (ref 70–99)
Potassium: 4.9 mmol/L (ref 3.5–5.1)
Sodium: 127 mmol/L — ABNORMAL LOW (ref 135–145)
Total Bilirubin: 1.3 mg/dL — ABNORMAL HIGH (ref 0.3–1.2)
Total Protein: 7.8 g/dL (ref 6.5–8.1)

## 2021-05-14 LAB — LACTIC ACID, PLASMA: Lactic Acid, Venous: 1.1 mmol/L (ref 0.5–1.9)

## 2021-05-14 LAB — PREGNANCY, URINE: Preg Test, Ur: NEGATIVE

## 2021-05-14 MED ORDER — LACTATED RINGERS IV SOLN: INTRAVENOUS | Status: DC

## 2021-05-14 MED ORDER — LACTATED RINGERS IV BOLUS (SEPSIS)
1000.0000 mL | Freq: Once | INTRAVENOUS | Status: AC
  Administered 2021-05-14: 1000 mL via INTRAVENOUS

## 2021-05-14 MED ORDER — FENTANYL CITRATE PF 50 MCG/ML IJ SOSY
50.0000 ug | PREFILLED_SYRINGE | Freq: Once | INTRAMUSCULAR | Status: AC
  Administered 2021-05-15: 50 ug via INTRAVENOUS
  Filled 2021-05-14: qty 1

## 2021-05-14 MED ORDER — LACTATED RINGERS IV BOLUS (SEPSIS)
1000.0000 mL | Freq: Once | INTRAVENOUS | Status: AC
  Administered 2021-05-15: 1000 mL via INTRAVENOUS

## 2021-05-14 MED ORDER — PIPERACILLIN-TAZOBACTAM 3.375 G IVPB 30 MIN
3.3750 g | Freq: Once | INTRAVENOUS | Status: AC
  Administered 2021-05-15: 3.375 g via INTRAVENOUS
  Filled 2021-05-14: qty 50

## 2021-05-14 MED ORDER — LACTATED RINGERS IV BOLUS (SEPSIS)
250.0000 mL | Freq: Once | INTRAVENOUS | Status: AC
  Administered 2021-05-15: 250 mL via INTRAVENOUS

## 2021-05-14 MED ORDER — ACETAMINOPHEN 325 MG PO TABS
650.0000 mg | ORAL_TABLET | Freq: Once | ORAL | Status: AC
  Administered 2021-05-15: 650 mg via ORAL
  Filled 2021-05-14: qty 2

## 2021-05-14 NOTE — ED Triage Notes (Signed)
Pt reports she hit her right foot several weeks ago and has wound on pinky toe. Pt states leg has been swelling and she is having pain up to her thigh. Right foot markedly swollen in comparison to left. She is a diabetic

## 2021-05-14 NOTE — ED Provider Notes (Signed)
[facility] Provider Note   CSN: [id number] Arrival date & time: 05/14/21  2152     History Chief Complaint  Patient presents with   Leg Swelling    A[facility]ail [facility] is a 45 y.o. female.  The history is provided by the patient and medical records.  Abigail Moran is a 45 y.o. female who presents to the Emergency Department complaining of leg pain and swelling. She presents the emergency department complaining of pain and swelling to the right lower extremity that started several days ago. Symptoms have significantly worsened. She has associated malleolus. No fevers at home but she is febrile on ED arrival. She states that she has not taken her insulin for the last several days due to feeling unwell. She does have a history of recurrent infections in that leg. She is a type I diabetic. No chest pain, difficulty breathing, vomiting, diarrhea. She does have a remote history of DVT in 2020 secondary to central line placement. She was briefly on anticoagulation for about three months and has since been off of anticoagulation.    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   Heart attack ([place]) 05/04/15   History of DVT (deep vein thrombosis)    following hospitalization 2020   History of kidney stones    Menorrhagia with irregular cycle 10/11/2020   Renal disorder    Vitamin D deficiency 09/14/2014    Patient Active Problem List   Diagnosis Date Noted   Cellulitis of right lower extremity 05/15/2021   Right shoulder pain 05/09/2021   Onychomycosis 03/31/2021   Menstrual bleeding problem 03/31/2021   Hyperlipidemia 03/31/2021   Menorrhagia with irregular cycle 10/11/2020   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   Iron deficiency anemia due to chronic blood loss 09/28/2020   Renal insufficiency    Gastroparesis 08/08/2016   Carpal tunnel syndrome 08/08/2016   CAD (coronary artery disease) 06/07/2015   CHF (congestive heart failure) ([place]) 06/07/2015   Anemia 10/11/2014   Preventative health care 09/27/2014   Vitamin D deficiency 09/14/2014   Uncontrolled type 1 diabetes mellitus with hyperglycemia ([place]) 10/06/2013    Past Surgical History:  Procedure Laterality Date   CARDIAC CATHETERIZATION  2016   CHOLECYSTECTOMY  2001   WISDOM TOOTH EXTRACTION       OB History   No obstetric history on file.     Family History  Problem Relation Age of Onset   Diabetes Father    Hypertension Father    Hyperlipidemia Father    Liver cancer Maternal Grandfather    Kidney disease Neg Hx    Heart disease Neg Hx    Colon polyps Neg Hx    Colon cancer Neg Hx    Stomach cancer Neg Hx    Rectal cancer Neg Hx     Social History   Tobacco Use   Smoking status: Never   Smokeless tobacco: Never  Vaping Use   Vaping Use: Never used  Substance Use Topics   Alcohol use: Not Currently    Comment: 3 x per month   Drug use: Not Currently    Frequency: 2.0 times per week    Types: Marijuana  Home Medications Prior to Admission medications   Medication Sig Start Date End Date Taking? Authorizing Provider  aspirin EC 81 MG tablet Take 81 mg by mouth daily.    [provider]  atorvastatin (LIPITOR) 10 MG tablet Take 1 tablet (10 mg total) by mouth daily. 04/02/21   [name], NP  blood glucose meter kit and supplies KIT Dispense based on patient and insurance preference. Use up to four times daily as directed. E11.9 09/28/20   [name], NP  famotidine (PEPCID) 20 MG tablet Take 1 tablet (20 mg total) by mouth 2 (two) times daily. 05/01/21   Palumbo, April, MD  glucose blood (FREESTYLE LITE) test strip USE FOUR TIMES DAILY 10/14/20   Philemon Kingdom, MD   insulin aspart (FIASP FLEXTOUCH) 100 UNIT/ML FlexTouch Pen Inject 6-10 Units into the skin 3 (three) times daily between meals. 10/14/20   Philemon Kingdom, MD  insulin degludec (TRESIBA FLEXTOUCH) 200 UNIT/ML FlexTouch Pen Inject 34-36 Units into the skin daily. 10/14/20   Philemon Kingdom, MD  Insulin Pen Needle 32G X 4 MM MISC Use 4x a day 10/14/20   Philemon Kingdom, MD  Lancets (FREESTYLE) lancets Use 4x a day 09/28/20   [name], NP  lidocaine (LIDODERM) 5 % Place 1 patch onto the skin daily. Remove & Discard patch within 12 hours or as directed by MD 05/01/21   Randal Buba, April, MD  naproxen (NAPROSYN) 375 MG tablet Take 1 tablet (375 mg total) by mouth 2 (two) times daily. 05/01/21   Palumbo, April, MD  naproxen (NAPROSYN) 375 MG tablet Take 1 tablet (375 mg total) by mouth 2 (two) times daily with a meal. 05/01/21   Palumbo, April, MD  ondansetron (ZOFRAN ODT) 8 MG disintegrating tablet 38m ODT q8 hours prn nausea 05/01/21   Palumbo, April, MD    Allergies    Latex and Tape  Review of Systems   Review of Systems  All other systems reviewed and are negative.  Physical Exam Updated Vital Signs BP 121/88   Pulse 94   Temp 99.4 F (37.4 C) (Oral)   Resp (!) 24   Ht 5' 5.5" (1.664 m)   Wt 73 kg   SpO2 92%   BMI 26.37 kg/m   Physical Exam Vitals and nursing note reviewed.  Constitutional:      Appearance: She is well-developed.  HENT:     Head: Normocephalic and atraumatic.  Cardiovascular:     Rate and Rhythm: Regular rhythm. Tachycardia present.     Heart sounds: No murmur heard. Pulmonary:     Effort: Pulmonary effort is normal. No respiratory distress.     Breath sounds: Normal breath sounds.  Abdominal:     Palpations: Abdomen is soft.     Tenderness: There is no abdominal tenderness. There is no guarding or rebound.  Musculoskeletal:     Comments: 2+ DP pulses bilaterally. There is is extensive erythema edema and soft tissue swelling to the right foot and  right lower extremity extending to just proximal to the right knee. Tenderness in the right lower extremity extends all the way to the right inguinal canal. There is no crepitance.    Skin:    General: Skin is warm and dry.  Neurological:     Mental Status: She is alert and oriented to person, place, and time.  Psychiatric:        Behavior: Behavior normal.    ED Results / Procedures / Treatments   Labs (all labs ordered are  listed, but only abnormal results are displayed) Labs Reviewed  COMPREHENSIVE METABOLIC PANEL - Abnormal; Notable for the following components:      Result Value   Sodium 127 (*)    Chloride 93 (*)    Glucose, Bld 505 (*)    BUN 21 (*)    Creatinine, Ser 1.25 (*)    Total Bilirubin 1.3 (*)    GFR, Estimated 54 (*)    All other components within normal limits  CBC WITH DIFFERENTIAL/PLATELET - Abnormal; Notable for the following components:   RBC 3.21 (*)    Hemoglobin 9.9 (*)    HCT 30.5 (*)    Neutro Abs 8.4 (*)    Abs Immature Granulocytes 0.08 (*)    All other components within normal limits  URINALYSIS, ROUTINE W REFLEX MICROSCOPIC - Abnormal; Notable for the following components:   APPearance HAZY (*)    Glucose, UA >=500 (*)    Hgb urine dipstick TRACE (*)    Ketones, ur 15 (*)    All other components within normal limits  URINALYSIS, MICROSCOPIC (REFLEX) - Abnormal; Notable for the following components:   Bacteria, UA MANY (*)    Trichomonas, UA PRESENT (*)    All other components within normal limits  CBC WITH DIFFERENTIAL/PLATELET - Abnormal; Notable for the following components:   RBC 2.79 (*)    Hemoglobin 8.6 (*)    HCT 26.5 (*)    All other components within normal limits  BASIC METABOLIC PANEL - Abnormal; Notable for the following components:   Sodium 130 (*)    Glucose, Bld 384 (*)    Creatinine, Ser 1.07 (*)    Calcium 8.4 (*)    All other components within normal limits  CBG MONITORING, ED - Abnormal; Notable for the following  components:   Glucose-Capillary 533 (*)    All other components within normal limits  CBG MONITORING, ED - Abnormal; Notable for the following components:   Glucose-Capillary 356 (*)    All other components within normal limits  RESP PANEL BY RT-PCR (FLU A&B, COVID) ARPGX2  CULTURE, BLOOD (ROUTINE X 2)  CULTURE, BLOOD (ROUTINE X 2)  LACTIC ACID, PLASMA  PREGNANCY, URINE  LACTIC ACID, PLASMA  LACTIC ACID, PLASMA  LACTIC ACID, PLASMA    EKG None  Radiology DG Tibia/Fibula Right  Result Date: 05/15/2021 CLINICAL DATA:  Right lower extremity pain.  Sepsis. EXAM: RIGHT TIBIA AND FIBULA - 2 VIEW; RIGHT FOOT - 2 VIEW COMPARISON:  None. FINDINGS: There is no acute fracture or dislocation. The bones are well mineralized. No arthritic changes. There is diffuse soft tissue swelling of the dorsum of the foot and diffuse subcutaneous edema of the leg suspicious for cellulitis. Clinical correlation is recommended. No radiopaque foreign object or soft tissue gas. IMPRESSION: 1. No acute osseous pathology. 2. Findings suspicious for cellulitis. Electronically Signed   By: Anner Crete M.D.   On: 05/15/2021 01:31   DG Chest Port 1 View  Result Date: 05/15/2021 CLINICAL DATA:  Leg pain.  Sepsis. EXAM: PORTABLE CHEST 1 VIEW COMPARISON:  None. FINDINGS: The cardiomediastinal contours are normal. The lungs are clear. Pulmonary vasculature is normal. No consolidation, pleural effusion, or pneumothorax. No acute osseous abnormalities are seen. IMPRESSION: Negative portable AP view of the chest. Electronically Signed   By: Keith Rake M.D.   On: 05/15/2021 01:30   DG Foot 2 Views Right  Result Date: 05/15/2021 CLINICAL DATA:  Right lower extremity pain.  Sepsis. EXAM: RIGHT TIBIA AND  FIBULA - 2 VIEW; RIGHT FOOT - 2 VIEW COMPARISON:  None. FINDINGS: There is no acute fracture or dislocation. The bones are well mineralized. No arthritic changes. There is diffuse soft tissue swelling of the dorsum of the  foot and diffuse subcutaneous edema of the leg suspicious for cellulitis. Clinical correlation is recommended. No radiopaque foreign object or soft tissue gas. IMPRESSION: 1. No acute osseous pathology. 2. Findings suspicious for cellulitis. Electronically Signed   By: Anner Crete M.D.   On: 05/15/2021 01:31    Procedures Procedures  CRITICAL CARE Performed by: Quintella Reichert   Total critical care time: 45 minutes  Critical care time was exclusive of separately billable procedures and treating other patients.  Critical care was necessary to treat or prevent imminent or life-threatening deterioration.  Critical care was time spent personally by me on the following activities: development of treatment plan with patient and/or surrogate as well as nursing, discussions with consultants, evaluation of patient's response to treatment, examination of patient, obtaining history from patient or surrogate, ordering and performing treatments and interventions, ordering and review of laboratory studies, ordering and review of radiographic studies, pulse oximetry and re-evaluation of patient's condition.  Medications Ordered in ED Medications  lactated ringers infusion ( Intravenous New Bag/Given 05/15/21 0208)  oxyCODONE (Oxy IR/ROXICODONE) immediate release tablet 5 mg (has no administration in time range)  enoxaparin (LOVENOX) injection 72.5 mg (has no administration in time range)  lactated ringers bolus 1,000 mL (0 mLs Intravenous Stopped 05/15/21 0242)    And  lactated ringers bolus 1,000 mL (0 mLs Intravenous Stopped 05/15/21 0330)    And  lactated ringers bolus 250 mL (0 mLs Intravenous Stopped 05/15/21 0330)  piperacillin-tazobactam (ZOSYN) IVPB 3.375 g (0 g Intravenous Stopped 05/15/21 0209)  fentaNYL (SUBLIMAZE) injection 50 mcg (50 mcg Intravenous Given 05/15/21 0003)  acetaminophen (TYLENOL) tablet 650 mg (650 mg Oral Given 05/15/21 0008)    ED Course  I have reviewed the triage vital  signs and the nursing notes.  Pertinent labs & imaging results that were available during my care of the patient were reviewed by me and considered in my medical decision making (see chart for details).    MDM Rules/Calculators/A&P                          patient with history of type I diabetes here for evaluation of right leg swelling and pain. She is ill appearing on evaluation with tachycardia. Examination is concerning for cellulitis to the right lower extremity from the foot to the knee. No evidence of necrotizing soft tissue infection. Labs significant for hyperglycemia, no evidence of DKA. She was treated with broad-spectrum antibiotics and aggressive fluid hydration for sepsis. Doubt DVT but will treat with Lovenox pending vascular ultrasound. She has no symptoms concerning for PE at this time. Discussed with patient recommendation for admission and she is in agreement with treatment plan. Hospitalist consulted for admission.  Final Clinical Impression(s) / ED Diagnoses Final diagnoses:  None    Rx / DC Orders ED Discharge Orders     None        Quintella Reichert, MD 05/15/21 [phone]

## 2021-05-15 ENCOUNTER — Inpatient Hospital Stay (HOSPITAL_COMMUNITY): Payer: Self-pay

## 2021-05-15 DIAGNOSIS — A419 Sepsis, unspecified organism: Secondary | ICD-10-CM

## 2021-05-15 DIAGNOSIS — E871 Hypo-osmolality and hyponatremia: Secondary | ICD-10-CM | POA: Diagnosis present

## 2021-05-15 DIAGNOSIS — L03115 Cellulitis of right lower limb: Secondary | ICD-10-CM | POA: Diagnosis present

## 2021-05-15 DIAGNOSIS — M7989 Other specified soft tissue disorders: Secondary | ICD-10-CM

## 2021-05-15 DIAGNOSIS — A599 Trichomoniasis, unspecified: Secondary | ICD-10-CM

## 2021-05-15 HISTORY — DX: Sepsis, unspecified organism: A41.9

## 2021-05-15 LAB — BASIC METABOLIC PANEL WITH GFR
Anion gap: 8 (ref 5–15)
BUN: 16 mg/dL (ref 6–20)
CO2: 24 mmol/L (ref 22–32)
Calcium: 8.4 mg/dL — ABNORMAL LOW (ref 8.9–10.3)
Chloride: 98 mmol/L (ref 98–111)
Creatinine, Ser: 1.07 mg/dL — ABNORMAL HIGH (ref 0.44–1.00)
GFR, Estimated: >60 mL/min (ref >60)
Glucose, Bld: 384 mg/dL — ABNORMAL HIGH (ref 70–99)
Potassium: 4.1 mmol/L (ref 3.5–5.1)
Sodium: 130 mmol/L — ABNORMAL LOW (ref 135–145)

## 2021-05-15 LAB — CBC WITH DIFFERENTIAL/PLATELET
Abs Immature Granulocytes: 0.07 K/uL (ref 0.00–0.07)
Basophils Absolute: 0 K/uL (ref 0.0–0.1)
Basophils Relative: 0 %
Eosinophils Absolute: 0 K/uL (ref 0.0–0.5)
Eosinophils Relative: 0 %
HCT: 26.5 % — ABNORMAL LOW (ref 36.0–46.0)
Hemoglobin: 8.6 g/dL — ABNORMAL LOW (ref 12.0–15.0)
Immature Granulocytes: 1 %
Lymphocytes Relative: 12 %
Lymphs Abs: 1.1 K/uL (ref 0.7–4.0)
MCH: 30.8 pg (ref 26.0–34.0)
MCHC: 32.5 g/dL (ref 30.0–36.0)
MCV: 95 fL (ref 80.0–100.0)
Monocytes Absolute: 0.8 K/uL (ref 0.1–1.0)
Monocytes Relative: 9 %
Neutro Abs: 7.5 K/uL (ref 1.7–7.7)
Neutrophils Relative %: 78 %
Platelets: 187 K/uL (ref 150–400)
RBC: 2.79 MIL/uL — ABNORMAL LOW (ref 3.87–5.11)
RDW: 13.8 % (ref 11.5–15.5)
WBC: 9.6 K/uL (ref 4.0–10.5)
nRBC: 0 % (ref 0.0–0.2)

## 2021-05-15 LAB — GLUCOSE, CAPILLARY
Glucose-Capillary: 289 mg/dL — ABNORMAL HIGH (ref 70–99)
Glucose-Capillary: 203 mg/dL — ABNORMAL HIGH (ref 70–99)
Glucose-Capillary: 376 mg/dL — ABNORMAL HIGH (ref 70–99)

## 2021-05-15 LAB — RESP PANEL BY RT-PCR (FLU A&B, COVID) ARPGX2
Influenza A by PCR: NEGATIVE
Influenza B by PCR: NEGATIVE
SARS Coronavirus 2 by RT PCR: NEGATIVE

## 2021-05-15 LAB — CBG MONITORING, ED
Glucose-Capillary: 356 mg/dL — ABNORMAL HIGH (ref 70–99)
Glucose-Capillary: 295 mg/dL — ABNORMAL HIGH (ref 70–99)

## 2021-05-15 LAB — LACTIC ACID, PLASMA
Lactic Acid, Venous: 1.3 mmol/L (ref 0.5–1.9)
Lactic Acid, Venous: 1.2 mmol/L (ref 0.5–1.9)

## 2021-05-15 MED ORDER — SODIUM CHLORIDE 0.9 % IV SOLN
2.0000 g | Freq: Three times a day (TID) | INTRAVENOUS | Status: DC
  Filled 2021-05-15: qty 2

## 2021-05-15 MED ORDER — INSULIN ASPART 100 UNIT/ML IJ SOLN
0.0000 [IU] | Freq: Three times a day (TID) | INTRAMUSCULAR | Status: DC
  Administered 2021-05-15: 5 [IU] via SUBCUTANEOUS
  Administered 2021-05-15: 8 [IU] via SUBCUTANEOUS
  Administered 2021-05-16: 11 [IU] via SUBCUTANEOUS
  Administered 2021-05-16 (×2): 5 [IU] via SUBCUTANEOUS
  Administered 2021-05-17: 3 [IU] via SUBCUTANEOUS
  Administered 2021-05-17: 5 [IU] via SUBCUTANEOUS
  Administered 2021-05-17: 3 [IU] via SUBCUTANEOUS
  Administered 2021-05-18 (×2): 2 [IU] via SUBCUTANEOUS
  Administered 2021-05-18: 3 [IU] via SUBCUTANEOUS
  Administered 2021-05-19 (×2): 2 [IU] via SUBCUTANEOUS
  Administered 2021-05-19: 11 [IU] via SUBCUTANEOUS
  Administered 2021-05-20 – 2021-05-21 (×2): 3 [IU] via SUBCUTANEOUS

## 2021-05-15 MED ORDER — INSULIN DEGLUDEC 200 UNIT/ML ~~LOC~~ SOPN: PEN_INJECTOR | Freq: Every day | SUBCUTANEOUS | Status: DC

## 2021-05-15 MED ORDER — PIPERACILLIN-TAZOBACTAM 3.375 G IVPB
3.3750 g | Freq: Three times a day (TID) | INTRAVENOUS | Status: DC
  Administered 2021-05-15 (×2): 3.375 g via INTRAVENOUS
  Filled 2021-05-15 (×2): qty 50

## 2021-05-15 MED ORDER — ENOXAPARIN SODIUM 40 MG/0.4ML IJ SOSY
40.0000 mg | PREFILLED_SYRINGE | INTRAMUSCULAR | Status: DC
  Administered 2021-05-16 – 2021-05-18 (×3): 40 mg via SUBCUTANEOUS
  Filled 2021-05-15 (×5): qty 0.4

## 2021-05-15 MED ORDER — INSULIN GLARGINE-YFGN 100 UNIT/ML ~~LOC~~ SOLN
20.0000 [IU] | Freq: Every day | SUBCUTANEOUS | Status: DC
Start: 2021-05-16 — End: 2021-05-22
  Administered 2021-05-16 – 2021-05-21 (×6): 20 [IU] via SUBCUTANEOUS
  Filled 2021-05-15 (×6): qty 0.2

## 2021-05-15 MED ORDER — ASPIRIN EC 81 MG PO TBEC
81.0000 mg | DELAYED_RELEASE_TABLET | Freq: Every day | ORAL | Status: DC
  Administered 2021-05-15 – 2021-05-21 (×7): 81 mg via ORAL
  Filled 2021-05-15 (×7): qty 1

## 2021-05-15 MED ORDER — ACETAMINOPHEN 650 MG RE SUPP: 650.0000 mg | Freq: Four times a day (QID) | RECTAL | Status: DC | PRN

## 2021-05-15 MED ORDER — ACETAMINOPHEN 325 MG PO TABS
650.0000 mg | ORAL_TABLET | Freq: Four times a day (QID) | ORAL | Status: DC | PRN
  Administered 2021-05-18 – 2021-05-21 (×3): 650 mg via ORAL
  Filled 2021-05-15 (×3): qty 2

## 2021-05-15 MED ORDER — OXYCODONE HCL 5 MG PO TABS
5.0000 mg | ORAL_TABLET | Freq: Once | ORAL | Status: DC
  Filled 2021-05-15: qty 1

## 2021-05-15 MED ORDER — OXYCODONE HCL 5 MG PO TABS
5.0000 mg | ORAL_TABLET | ORAL | Status: DC | PRN
  Administered 2021-05-15: 5 mg via ORAL
  Filled 2021-05-15 (×2): qty 1

## 2021-05-15 MED ORDER — ONDANSETRON HCL 4 MG PO TABS
4.0000 mg | ORAL_TABLET | Freq: Four times a day (QID) | ORAL | Status: DC | PRN
  Administered 2021-05-21: 4 mg via ORAL
  Filled 2021-05-15: qty 1

## 2021-05-15 MED ORDER — METRONIDAZOLE 500 MG/100ML IV SOLN
500.0000 mg | Freq: Two times a day (BID) | INTRAVENOUS | Status: DC
  Administered 2021-05-15 – 2021-05-17 (×4): 500 mg via INTRAVENOUS
  Filled 2021-05-15 (×4): qty 100

## 2021-05-15 MED ORDER — INSULIN ASPART 100 UNIT/ML IJ SOLN: 0.0000 [IU] | Freq: Three times a day (TID) | INTRAMUSCULAR | Status: DC

## 2021-05-15 MED ORDER — ACETAMINOPHEN 325 MG PO TABS
650.0000 mg | ORAL_TABLET | Freq: Once | ORAL | Status: AC
  Administered 2021-05-15: 650 mg via ORAL

## 2021-05-15 MED ORDER — HYDROMORPHONE HCL 1 MG/ML IJ SOLN
1.0000 mg | INTRAMUSCULAR | Status: AC | PRN
  Administered 2021-05-15 – 2021-05-16 (×3): 1 mg via INTRAVENOUS
  Filled 2021-05-15 (×3): qty 1

## 2021-05-15 MED ORDER — LACTATED RINGERS IV SOLN: INTRAVENOUS | Status: DC

## 2021-05-15 MED ORDER — ENOXAPARIN SODIUM 80 MG/0.8ML IJ SOSY
1.0000 mg/kg | PREFILLED_SYRINGE | Freq: Once | INTRAMUSCULAR | Status: AC
  Administered 2021-05-15: 72.5 mg via SUBCUTANEOUS
  Filled 2021-05-15: qty 0.8

## 2021-05-15 MED ORDER — FAMOTIDINE 20 MG PO TABS
20.0000 mg | ORAL_TABLET | Freq: Two times a day (BID) | ORAL | Status: DC
  Administered 2021-05-15 – 2021-05-21 (×13): 20 mg via ORAL
  Filled 2021-05-15 (×13): qty 1

## 2021-05-15 MED ORDER — ACETAMINOPHEN 325 MG PO TABS
ORAL_TABLET | ORAL | Status: AC
  Filled 2021-05-15: qty 2

## 2021-05-15 MED ORDER — ONDANSETRON HCL 4 MG/2ML IJ SOLN
4.0000 mg | Freq: Four times a day (QID) | INTRAMUSCULAR | Status: DC | PRN
  Administered 2021-05-15 – 2021-05-20 (×5): 4 mg via INTRAVENOUS
  Filled 2021-05-15 (×5): qty 2

## 2021-05-15 MED ORDER — INSULIN GLARGINE-YFGN 100 UNIT/ML ~~LOC~~ SOLN
20.0000 [IU] | Freq: Once | SUBCUTANEOUS | Status: AC
  Administered 2021-05-15: 20 [IU] via SUBCUTANEOUS
  Filled 2021-05-15: qty 10

## 2021-05-15 MED ORDER — SODIUM CHLORIDE 0.9 % IV SOLN
2.0000 g | Freq: Three times a day (TID) | INTRAVENOUS | Status: DC
  Administered 2021-05-15 – 2021-05-20 (×15): 2 g via INTRAVENOUS
  Filled 2021-05-15 (×18): qty 2

## 2021-05-15 MED ORDER — KETOROLAC TROMETHAMINE 30 MG/ML IJ SOLN
30.0000 mg | Freq: Once | INTRAMUSCULAR | Status: AC
  Administered 2021-05-15: 30 mg via INTRAVENOUS
  Filled 2021-05-15: qty 1

## 2021-05-15 NOTE — Progress Notes (Signed)
Inpatient Diabetes Program Recommendations  AACE/ADA: New Consensus Statement on Inpatient Glycemic Control (2015)  Target Ranges:  Prepandial:   less than 140 mg/dL      Peak postprandial:   less than 180 mg/dL (1-2 hours)      Critically ill patients:  140 - 180 mg/dL   Lab Results  Component Value Date   GLUCAP 356 (H) 05/15/2021   HGBA1C 12.6 (H) 03/31/2021    Review of Glycemic Control Results for MINNETTA, SANDORA (MRN [id number]) as of 05/15/2021 09:15  Ref. Range 05/14/2021 22:25 05/15/2021 04:00  Glucose-Capillary Latest Ref Range: 70 - 99 mg/dL 937 (HH) 342 (H)   Diabetes history: DM1 (requires basal, meal coverage, + correction) Outpatient Diabetes medications: Tresiba 20 units, Fiasp 3-4 units tid meal coverage Current orders for Inpatient glycemic control: None  Inpatient Diabetes Program Recommendations:   -Semglee 20 units (give now) -Glycemic control order set for inpatient 0-9 units tid + hs 0-5 units -Add Novolog meal coverage 2-3 units when eating 50% meals  Spoke with patient via phone to clarify when she last took basal insulin and dosing. Patient states she hasn't taken basal insulin for a couple of days. Patient ran out of Evaristo Bury (needs to pay up on her insurance), and had not gotten needles to use the NPH she picked up from [place]. When patient runs out of Tresiba, patient purchases NPH 20 units bid to use along with her Fiasp to cover CBGs. Will order [facility] consult for patient regarding insulin affordability.  Will follow during hospitalization.  Thank you, Billy Fischer. Rollyn Scialdone, RN, MSN, CDE  Diabetes Coordinator Inpatient Glycemic Control Team Team Pager [phone] (8am-5pm) 05/15/2021 9:54 AM

## 2021-05-15 NOTE — Progress Notes (Signed)
Patient: Abigail Moran, A[facility]ail Moran  DOB: 1975/09/30 BWG:[id number] Transferring facility: [email or web address] Requesting provider: Dr. Madilyn Hook (EDP at [facility]) Reason for transfer: admission for further evaluation and management of sepsis due to right lower extremity cellulitis   44 year old female with history of type 1 diabetes mellitus, who presented to [facility] ED on 05/14/2021 complaining of 2 to 3 days of progressive right lower extremity erythema involving the right foot with proximal extension to level just distal to the right knee, and associated with tenderness, increased warmth, swelling, subjective fever.  Reportedly occurred after mild trauma to the right lower extremity approximately 1 week ago.  Not associated with any reported purulent drainage.  she was reportedly hospitalized in September 2021 for similar right lower extremity cellulitis. Also has reported history of DVT in 2020 for which she reportedly underwent 3 months of anticoagulation.    At [facility], found to be objectively febrile, initially tachycardic, which improved with IV fluids.  Soft initial blood pressure, which improved following 30 mL/kg IVF bolus, most recent blood pressure reported to be 109/76.  Blood sugar elevated, but labs do not reflect anion gap metabolic acidosis.  Patient conveys that she has missed a few doses of insulin over the last few days.  Lactic acid nonelevated.  COVID screen negative. Dr. Madilyn Hook conveyed to me that [facility] ED does not have available vascular ultrasound, but recommends that this be performed following arrival to [facility] to further evaluate for the presence of DVT.  Started on Zosyn.   Subsequently, I accepted this patient for transfer for admission to pcu bed at [facility] or [facility] (first available) for further work-up and management of sepsis due to right lower extremity cellulitis.     Newton Pigg, DO Hospitalist

## 2021-05-15 NOTE — ED Notes (Signed)
Pt resting sleeping, NAD, calm, interactive, denies pain, IVF infusing, VSS, consent to transfer signed, pending ready bed at [facility].

## 2021-05-15 NOTE — Progress Notes (Signed)
Right lower extremity venous duplex has been completed. Preliminary results can be found in CV Proc through chart review.  Results were given to the patient's nurse, Isaiah Serge.  05/15/21 3:31 PM Olen Cordial RVT

## 2021-05-15 NOTE — ED Notes (Signed)
[facility] here. No changes. Semglee long acting insulin given for BS. Tylenol given for pain.

## 2021-05-15 NOTE — Progress Notes (Signed)
Pharmacy Antibiotic Note  Abigail Moran is a 45 y.o. female admitted on 05/14/2021 with leg swelling. Patient with PMH of provoked DVT in 2020 treated with 3 months of anticoagulation and recurrent leg infections. Zosyn one time dose given in ED. Pharmacy has been consulted for Zosyn dosing.  Update 05/15/21 2:28 PM - [facility] transitioning to cefepime + Flagyl  Plan: Cefepime 2 g iv q 8 hours  Flagyl 500 mg iv q 12 hours per MD  Height: 5' 5.5" (166.4 cm) Weight: 73 kg (160 lb 15 oz) IBW/kg (Calculated) : 58.15  Temp (24hrs), Avg:99.4 F (37.4 C), Min:98.2 F (36.8 C), Max:100.8 F (38.2 C)  Recent Labs  Lab 05/14/21 2254 05/15/21 0419 05/15/21 1111  WBC 9.9 9.6  --   CREATININE 1.25* 1.07*  --   LATICACIDVEN 1.1 1.3 1.2     Estimated Creatinine Clearance: 66.5 mL/min (A) (by C-G formula based on SCr of 1.07 mg/dL (H)).    Allergies  Allergen Reactions   Latex Itching   Tape Hives    Antimicrobials this admission: Zosyn 11/7  Cefepime 11/7 >> Flagyl 11/7 >>  Dose adjustments this admission:  Microbiology results: 11/6 Bcx:   Thank you for allowing pharmacy to be a part of this patient's care.  Luisa Hart D  05/15/2021 1:57 PM

## 2021-05-15 NOTE — Progress Notes (Signed)
Pt being followed by [facility] for Sepsis protocol. 

## 2021-05-15 NOTE — Progress Notes (Signed)
Pharmacy Antibiotic Note  Abigail Moran is a 45 y.o. female admitted on 05/14/2021 with leg swelling. Patient with PMH of provoked DVT in 2020 treated with 3 months of anticoagulation and recurrent leg infections. Zosyn one time dose given in ED. Pharmacy has been consulted for Zosyn dosing.  Plan: Continue Zosyn 3.375g IV q8h (extended infusion)  Height: 5' 5.5" (166.4 cm) Weight: 73 kg (160 lb 15 oz) IBW/kg (Calculated) : 58.15  Temp (24hrs), Avg:100.1 F (37.8 C), Min:99.4 F (37.4 C), Max:100.8 F (38.2 C)  Recent Labs  Lab 05/14/21 2254 05/15/21 0419  WBC 9.9 9.6  CREATININE 1.25* 1.07*  LATICACIDVEN 1.1 1.3    Estimated Creatinine Clearance: 66.5 mL/min (A) (by C-G formula based on SCr of 1.07 mg/dL (H)).    Allergies  Allergen Reactions   Latex Itching   Tape Hives    Antimicrobials this admission: Zosyn 11/7 >>  Dose adjustments this admission:  Microbiology results: 11/6 bcx:   Thank you for allowing pharmacy to be a part of this patient's care.  Gerrit Halls, PharmD, BCPS Clinical Pharmacist 05/15/2021 7:52 AM

## 2021-05-15 NOTE — H&P (Signed)
History and Physical    Abigail Moran FVC:[id number] DOB: [birth year] DOA: 05/14/2021  PCP: [name], NP   Patient coming from: Home.  I have personally briefly reviewed patient's old medical records in [place]  Chief Complaint: Right lower extremity cellulitis.  HPI: Abigail Moran is a 45 y.o. female with medical history significant of seasonal allergies, erythropoietin deficiency anemia, right foot cellulitis and abscess, right lower extremity DVT, urolithiasis, menorrhagia with irregular cycle, stage 3a CKD, vitamin D deficiency who went to the emergency department due to left foot pain after sustaining an injury several days ago on her right fifth toe.  She has not been taking her insulin at home.  She has been having chills, but no fevers at home.  She was febrile on arrival to the emergency department.  She has had mild nausea and mild abdominal pain.  No emesis, diarrhea, constipation, melena or hematochezia.  No flank pain, dysuria, frequency or hematuria.  She denied chest pain, palpitations, diaphoresis, PND, orthopnea, polyuria, polydipsia, polyphagia or blurred vision.  ED Course: Initial vital signs were temperature 100.8 F, pulse 115, respirations 22, BP 115/80 mmHg O2 sat 100% on room air.  The patient received 2250 mL of LR bolus, acetaminophen 650 mg p.o. x2.  Lovenox 72.5 mg SQ x1 Zosyn and fentanyl 50 mcg IVP x1 in the emergency department.  Lab work: Urinalysis showed glucosuria more than 500 mg/dL with trace bacteria and ketonuria 50 mg/dL.  Microscopic examination showed many bacteria present and trichomonas.  Her CBC showed a white count 9.9, hemoglobin 9.9 g/dL and platelets 202.  Imaging: A portable chest radiograph did not show any acute abnormality.  2 view right foot radiographs did not show any signs of osteomyelitis.  Please see images and full radiology report for further detail.  Review of Systems: As per HPI otherwise all other systems  reviewed and are negative.  Past Medical History:  Diagnosis Date   Allergy    seasonal allergies   Anemia    on meds   Blood transfusion without reported diagnosis 2008   Cellulitis and abscess of foot 03/18/2020   right foot   Diabetes mellitus without complication ([place])    on meds   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   Heart attack ([place]) 05/04/15   History of DVT (deep vein thrombosis)    following hospitalization 2020   History of kidney stones    Menorrhagia with irregular cycle 10/11/2020   Renal disorder    Vitamin D deficiency 09/14/2014    Past Surgical History:  Procedure Laterality Date   CARDIAC CATHETERIZATION  2016   CHOLECYSTECTOMY  2001   WISDOM TOOTH EXTRACTION      Social History  reports that she has never smoked. She has never used smokeless tobacco. She reports that she does not currently use alcohol. She reports that she does not currently use drugs after having used the following drugs: Marijuana. Frequency: 2.00 times per week.  Allergies  Allergen Reactions   Latex Itching   Tape Hives    Family History  Problem Relation Age of Onset   Diabetes Father    Hypertension Father    Hyperlipidemia Father    Liver cancer Maternal Grandfather    Kidney disease Neg Hx    Heart disease Neg Hx    Colon polyps Neg Hx    Colon cancer Neg Hx    Stomach cancer Neg Hx    Rectal cancer Neg Hx    Prior to Admission  medications   Medication Sig Start Date End Date Taking? Authorizing Provider  aspirin EC 81 MG tablet Take 81 mg by mouth daily.   Yes [provider]  insulin aspart (FIASP FLEXTOUCH) 100 UNIT/ML FlexTouch Pen Inject 6-10 Units into the skin 3 (three) times daily between meals. Patient taking differently: Inject 4-10 Units into the skin 3 (three) times daily between meals. 10/14/20  Yes Philemon Kingdom, MD  insulin degludec (TRESIBA FLEXTOUCH) 200 UNIT/ML FlexTouch Pen Inject 34-36 Units into the skin daily. Patient taking  differently: Inject 20 Units into the skin daily. 10/14/20  Yes Philemon Kingdom, MD  Multiple Vitamin (MULTIVITAMIN) tablet Take 1 tablet by mouth daily.   Yes [provider]  atorvastatin (LIPITOR) 10 MG tablet Take 1 tablet (10 mg total) by mouth daily. Patient not taking: Reported on 05/15/2021 04/02/21   [name], NP  blood glucose meter kit and supplies KIT Dispense based on patient and insurance preference. Use up to four times daily as directed. E11.9 09/28/20   [name], NP  famotidine (PEPCID) 20 MG tablet Take 1 tablet (20 mg total) by mouth 2 (two) times daily. Patient not taking: Reported on 05/15/2021 05/01/21   Palumbo, April, MD  glucose blood (FREESTYLE LITE) test strip USE FOUR TIMES DAILY 10/14/20   Philemon Kingdom, MD  Insulin Pen Needle 32G X 4 MM MISC Use 4x a day 10/14/20   Philemon Kingdom, MD  Lancets (FREESTYLE) lancets Use 4x a day 09/28/20   [name], NP  lidocaine (LIDODERM) 5 % Place 1 patch onto the skin daily. Remove & Discard patch within 12 hours or as directed by MD Patient not taking: Reported on 05/15/2021 05/01/21   Palumbo, April, MD  naproxen (NAPROSYN) 375 MG tablet Take 1 tablet (375 mg total) by mouth 2 (two) times daily with a meal. Patient not taking: Reported on 05/15/2021 05/01/21   Randal Buba, April, MD    Physical Exam: Vitals:   05/15/21 0930 05/15/21 0950 05/15/21 1000 05/15/21 1101  BP: 120/86  (!) 151/102 (!) 146/101  Pulse: 98  (!) 106 (!) 109  Resp: '14  14 20  ' Temp:  98.2 F (36.8 C)  98.6 F (37 C)  TempSrc:  Oral  Oral  SpO2: 97%  99% 100%  Weight:      Height:        Constitutional: Chronically ill-appearing.  NAD, calm, comfortable Eyes: PERRL, lids and conjunctivae normal ENMT: Mucous membranes are moist. Posterior pharynx clear of any exudate or lesions. Neck: normal, supple, no masses, no thyromegaly Respiratory: clear to auscultation bilaterally, no wheezing, no crackles. Normal  respiratory effort. No accessory muscle use.  Cardiovascular: Regular rate and rhythm, no murmurs / rubs / gallops. No extremity edema. 2+ pedal pulses. No carotid bruits.  Abdomen: No distention.  Bowel sounds positive.  Soft, no tenderness, no masses palpated. No hepatosplenomegaly.  Musculoskeletal: no clubbing / cyanosis.  Good ROM, no contractures. Normal muscle tone.  Skin: Positive erythema, calor, edema and TTP of right foot.  Please see images below. Neurologic: CN 2-12 grossly intact.  Decreased sensation on distal lower extremities DTR normal. Strength 5/5 in all 4.  Psychiatric: Normal judgment and insight. Alert and oriented x 3. Normal mood.        Labs on Admission: I have personally reviewed following labs and imaging studies  CBC: Recent Labs  Lab 05/14/21 2254 05/15/21 0419  WBC 9.9 9.6  NEUTROABS 8.4* 7.5  HGB 9.9* 8.6*  HCT 30.5* 26.5*  MCV 95.0 95.0  PLT 202 425    Basic Metabolic Panel: Recent Labs  Lab 05/14/21 2254 05/15/21 0419  NA 127* 130*  K 4.9 4.1  CL 93* 98  CO2 23 24  GLUCOSE 505* 384*  BUN 21* 16  CREATININE 1.25* 1.07*  CALCIUM 9.0 8.4*    GFR: Estimated Creatinine Clearance: 66.5 mL/min (A) (by C-G formula based on SCr of 1.07 mg/dL (H)).  Liver Function Tests: Recent Labs  Lab 05/14/21 2254  AST 23  ALT 13  ALKPHOS 85  BILITOT 1.3*  PROT 7.8  ALBUMIN 3.5    Urine analysis:    Component Value Date/Time   COLORURINE YELLOW 05/14/2021 2254   APPEARANCEUR HAZY (A) 05/14/2021 2254   LABSPEC 1.010 05/14/2021 2254   PHURINE 6.5 05/14/2021 2254   GLUCOSEU >=500 (A) 05/14/2021 2254        HGBUR TRACE (A) 05/14/2021 2254   BILIRUBINUR NEGATIVE 05/14/2021 2254   KETONESUR 15 (A) 05/14/2021 2254   PROTEINUR NEGATIVE 05/14/2021 2254        NITRITE NEGATIVE 05/14/2021 2254   LEUKOCYTESUR NEGATIVE 05/14/2021 2254    Radiological Exams on Admission: DG Tibia/Fibula Right  Result Date: 05/15/2021 CLINICAL DATA:  Right  lower extremity pain.  Sepsis. EXAM: RIGHT TIBIA AND FIBULA - 2 VIEW; RIGHT FOOT - 2 VIEW COMPARISON:  None. FINDINGS: There is no acute fracture or dislocation. The bones are well mineralized. No arthritic changes. There is diffuse soft tissue swelling of the dorsum of the foot and diffuse subcutaneous edema of the leg suspicious for cellulitis. Clinical correlation is recommended. No radiopaque foreign object or soft tissue gas. IMPRESSION: 1. No acute osseous pathology. 2. Findings suspicious for cellulitis. Electronically Signed   By: Anner Crete M.D.   On: 05/15/2021 01:31   DG Chest Port 1 View  Result Date: 05/15/2021 CLINICAL DATA:  Leg pain.  Sepsis. EXAM: PORTABLE CHEST 1 VIEW COMPARISON:  None. FINDINGS: The cardiomediastinal contours are normal. The lungs are clear. Pulmonary vasculature is normal. No consolidation, pleural effusion, or pneumothorax. No acute osseous abnormalities are seen. IMPRESSION: Negative portable AP view of the chest. Electronically Signed   By: Keith Rake M.D.   On: 05/15/2021 01:30   DG Foot 2 Views Right  Result Date: 05/15/2021 CLINICAL DATA:  Right lower extremity pain.  Sepsis. EXAM: RIGHT TIBIA AND FIBULA - 2 VIEW; RIGHT FOOT - 2 VIEW COMPARISON:  None. FINDINGS: There is no acute fracture or dislocation. The bones are well mineralized. No arthritic changes. There is diffuse soft tissue swelling of the dorsum of the foot and diffuse subcutaneous edema of the leg suspicious for cellulitis. Clinical correlation is recommended. No radiopaque foreign object or soft tissue gas. IMPRESSION: 1. No acute osseous pathology. 2. Findings suspicious for cellulitis. Electronically Signed   By: Anner Crete M.D.   On: 05/15/2021 01:31    EKG: Independently reviewed.   Assessment/Plan Principal Problem:   Cellulitis of right lower extremity   Sepsis due to cellulitis (HCC) SIRS criteria met: Fever, tachycardia and tachypnea. Plus known source of  infection  Admit to PCU/inpatient. Continue IV fluids at a lower rate. Begin cefepime 2 g every 8 hours. Begin metronidazole 500 mg IVP every 12 hours. Follow-up blood culture and sensitivity. Analgesics and antiemetics as needed.  Active Problems:   Uncontrolled type 1 diabetes mellitus with hyperglycemia (HCC) Resume Semglee 20 units daily. Carbohydrate modified diet. CBG monitoring with RI SS.    Trichomoniasis, unspecified Found in urine.  Currently on metronidazole.    Hyponatremia Continue IV fluids. Follow-up sodium level.    Hypocalcemia Recheck calcium level in AM.    CAD (coronary artery disease) Continue aspirin. Not taking atorvastatin. Follow-up with cardiology as an outpatient.    Gastroparesis Antiemetics as needed. Continue famotidine 20 mg p.o. twice daily.    Erythropoietin deficiency anemia Erythropoietin as needed per nephrology. Follow-up hematocrit and hemoglobin.    Hyperlipidemia Not taking atorvastatin. Follow-up with PCP.     DVT prophylaxis: Lovenox SQ. Code Status:   Full code. Family Communication:   Disposition Plan:   Patient is from:  Home.  Anticipated DC to:  Home.  Anticipated DC date:  05/17/2021 or 05/18/2021.  Anticipated DC barriers: Clinical status.  Consults called:   Admission status:  Inpatient/PCU.  Severity of Illness:  Reubin Milan MD [facility]  How to contact the [facility] Attending or Consulting provider [place] or covering provider during after hours [place], for this patient?   Check the care team in [facility] and look for a) attending/consulting [facility] provider listed and b) the [facility] team listed Log into [email or web address] and use [facility] universal password to access. If you do not have the password, please contact the hospital operator. Locate the [facility] provider you are looking for under [facility] and page to a number that you can be directly reached. If you still have difficulty reaching the  provider, please page the [facility] (Director on Call) for the Hospitalists listed on amion for assistance.  05/15/2021, 3:00 PM   This document was prepared using Dragon voice recognition software and may contain some unintended transcription errors.

## 2021-05-16 DIAGNOSIS — K3184 Gastroparesis: Secondary | ICD-10-CM

## 2021-05-16 DIAGNOSIS — D631 Anemia in chronic kidney disease: Secondary | ICD-10-CM

## 2021-05-16 LAB — HIV ANTIBODY (ROUTINE TESTING W REFLEX): HIV Screen 4th Generation wRfx: NONREACTIVE

## 2021-05-16 LAB — GLUCOSE, CAPILLARY
Glucose-Capillary: 209 mg/dL — ABNORMAL HIGH (ref 70–99)
Glucose-Capillary: 222 mg/dL — ABNORMAL HIGH (ref 70–99)
Glucose-Capillary: 245 mg/dL — ABNORMAL HIGH (ref 70–99)
Glucose-Capillary: 330 mg/dL — ABNORMAL HIGH (ref 70–99)
Glucose-Capillary: 334 mg/dL — ABNORMAL HIGH (ref 70–99)
Glucose-Capillary: 381 mg/dL — ABNORMAL HIGH (ref 70–99)

## 2021-05-16 MED ORDER — INSULIN ASPART 100 UNIT/ML IJ SOLN
10.0000 [IU] | Freq: Once | INTRAMUSCULAR | Status: AC
  Administered 2021-05-16: 10 [IU] via SUBCUTANEOUS

## 2021-05-16 NOTE — Progress Notes (Addendum)
CBG 376. Notified Garner Nash, NP. He stated to recheck CBG in an hour.   CBG 381. Notified Garner Nash, NP. New order for 10 units novolog insulin given. Repeat CBG in am.   CBG at 425am was 334. Will continue to  monitor.

## 2021-05-16 NOTE — TOC Initial Note (Signed)
Transition of Care [facility]) - Initial/Assessment Note    Patient Details  Name: Abigail Moran MRN: [id number] Date of Birth: [birth year]  Transition of Care [facility]) CM/SW Contact:    Leeroy Cha, RN Phone Number: 05/16/2021, 8:00 AM  Clinical Narrative:                 45 y.o. female with medical history significant of seasonal allergies, erythropoietin deficiency anemia, right foot cellulitis and abscess, right lower extremity DVT, urolithiasis, menorrhagia with irregular cycle, stage 3a CKD, vitamin D deficiency who went to the emergency department due to left foot pain after sustaining an injury several days ago on her right fifth toe.  She has not been taking her insulin at home.  She has been having chills, but no fevers at home.  She was febrile on arrival to the emergency department.  She has had mild nausea and mild abdominal pain.  No emesis, diarrhea, constipation, melena or hematochezia.  No flank pain, dysuria, frequency or hematuria.  She denied chest pain, palpitations, diaphoresis, PND, orthopnea, polyuria, polydipsia, polyphagia or blurred vision.   ED Course: Initial vital signs were temperature 100.8 F, pulse 115, respirations 22, BP 115/80 mmHg O2 sat 100% on room air.  The patient received 2250 mL of LR bolus, acetaminophen 650 mg p.o. x2.  Lovenox 72.5 mg SQ x1 Zosyn and fentanyl 50 mcg IVP x1 in the emergency department.  TOC PLAN OF CARE: Following for hhc needs and progression of care.  Expected Discharge Plan: Home/Self Care Barriers to Discharge: Continued Medical Work up   Patient Goals and CMS Choice Patient states their goals for this hospitalization and ongoing recovery are:: i would like to go home CMS Medicare.gov Compare Post Acute Care list provided to:: Patient    Expected Discharge Plan and Services Expected Discharge Plan: Home/Self Care   Discharge Planning Services: CM Consult   Living arrangements for the past 2 months: Single Family Home                                       Prior Living Arrangements/Services Living arrangements for the past 2 months: Single Family Home Lives with:: Self Patient language and need for interpreter reviewed:: Yes Do you feel safe going back to the place where you live?: Yes            Criminal Activity/Legal Involvement Pertinent to Current Situation/Hospitalization: No - Comment as needed  Activities of Daily Living Home Assistive Devices/Equipment: CBG Meter ADL Screening (condition at time of admission) Patient's cognitive ability adequate to safely complete daily activities?: Yes Is the patient deaf or have difficulty hearing?: No Does the patient have difficulty seeing, even when wearing glasses/contacts?: No Does the patient have difficulty concentrating, remembering, or making decisions?: No Patient able to express need for assistance with ADLs?: Yes Does the patient have difficulty dressing or bathing?: No Independently performs ADLs?: Yes (appropriate for developmental age) Does the patient have difficulty walking or climbing stairs?: No Weakness of Legs: None Weakness of Arms/Hands: None  Permission Sought/Granted                  Emotional Assessment Appearance:: Appears stated age Attitude/Demeanor/Rapport: Engaged Affect (typically observed): Calm Orientation: : Oriented to Place, Oriented to Self, Oriented to  Time, Oriented to Situation Alcohol / Substance Use: Not Applicable Psych Involvement: No (comment)  Admission diagnosis:  Cellulitis of right  lower extremity [L03.115] Sepsis without acute organ dysfunction, due to unspecified organism [facility]) [A41.9] Patient Active Problem List   Diagnosis Date Noted   Cellulitis of right lower extremity 05/15/2021   Hyponatremia 05/15/2021   Hypocalcemia 05/15/2021   Sepsis due to cellulitis (HCC) 05/15/2021   Trichomoniasis, unspecified 05/15/2021   Right shoulder pain 05/09/2021   Onychomycosis 03/31/2021    Menstrual bleeding problem 03/31/2021   Hyperlipidemia 03/31/2021   Menorrhagia with irregular cycle 10/11/2020   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   Iron deficiency anemia due to chronic blood loss 09/28/2020   Renal insufficiency    Gastroparesis 08/08/2016   Carpal tunnel syndrome 08/08/2016   CAD (coronary artery disease) 06/07/2015   CHF (congestive heart failure) (HCC) 06/07/2015   Anemia 10/11/2014   Preventative health care 09/27/2014   Vitamin D deficiency 09/14/2014   Uncontrolled type 1 diabetes mellitus with hyperglycemia (HCC) 10/06/2013   PCP:  Sandford Craze, NP Pharmacy:   [facility] [id number] - [place], [place] - [address] AT [facility] [address] [place] [address] Phone: [phone] Fax: [phone]     Social Determinants of Health (SDOH) Interventions    Readmission Risk Interventions No flowsheet data found.

## 2021-05-16 NOTE — Progress Notes (Signed)
PROGRESS NOTE    Abigail Moran  [id number] DOB: 12/05/1975 DOA: 05/14/2021 PCP: [name], NP    Brief Narrative:  45 y.o. female with medical history significant of seasonal allergies, erythropoietin deficiency anemia, right foot cellulitis and abscess, right lower extremity DVT, urolithiasis, menorrhagia with irregular cycle, stage 3a CKD, vitamin D deficiency who went to the emergency department due to left foot pain after sustaining an injury several days ago on her right fifth toe.  She has not been taking her insulin at home.  She has been having chills, but no fevers at home.  She was febrile on arrival to the emergency department. Pt was subsequently admitted for LE cellulitis with sepsis  Assessment & Plan:   Principal Problem:   Cellulitis of right lower extremity Active Problems:   Uncontrolled type 1 diabetes mellitus with hyperglycemia (HCC)   CAD (coronary artery disease)   Gastroparesis   Erythropoietin deficiency anemia   Hyperlipidemia   Hyponatremia   Hypocalcemia   Sepsis due to cellulitis (HCC)   Trichomoniasis, unspecified  Principal Problem:   Cellulitis of right lower extremity   Sepsis due to cellulitis [facility]) Presented with fever, tachycardia and tachypnea in the setting of cellulitis Pt has been continued on cefepime and metronidazole Blood cx thus far neg <24hrs Erythema and edema has improved, although R foot remains edematous and tender Would continue current IV abx for now Sepsis physiology has resolved Repeat bmet and CBC in AM   Active Problems:   Uncontrolled type 1 diabetes mellitus with hyperglycemia (HCC) Continued Semglee 20 units daily. Carbohydrate modified diet. Continue with SSI coverage as needed Would cont to adjust insulin with goal of euglycemia Recent a1c of 12.6 on 03/31/21     Trichomoniasis, unspecified Noted in urine. Currently on metronidazole per above     Hyponatremia On IVF hydration Corrected sodium  around 135-136 when taking account hyperglycemia Repeat bmet in AM     Hypocalcemia Recheck bmet in AM     CAD (coronary artery disease) Denies chest pain this AM Continue aspirin. Not taking atorvastatin. Recommend close outpt f/u with Cardiology when discharged Pt followed by [facility]iology, last seen 11/25/18     Gastroparesis Continue with antiemetics as needed. Without nausea this AM Continue famotidine 20 mg p.o. twice daily.     Erythropoietin deficiency anemia Erythropoietin as needed per nephrology. Repeat CBC in AM     Hyperlipidemia Not taking atorvastatin. Follow-up with PCP.   DVT prophylaxis: Lovenox subq Code Status: Full Family Communication: Pt in room, family not at bedside  Status is: Inpatient  Remains inpatient appropriate because: Severity of illness requiring IV antibiotics   Consultants:    Procedures:    Antimicrobials: Anti-infectives (From admission, onward)    Start     Dose/Rate Route Frequency Ordered Stop   05/15/21 2200  metroNIDAZOLE (FLAGYL) IVPB 500 mg        500 mg 100 mL/hr over 60 Minutes Intravenous Every 12 hours 05/15/21 1426     05/15/21 2200  ceFEPIme (MAXIPIME) 2 g in sodium chloride 0.9 % 100 mL IVPB  Status:  Discontinued        2 g 200 mL/hr over 30 Minutes Intravenous Every 8 hours 05/15/21 1427 05/15/21 1435   05/15/21 1530  ceFEPIme (MAXIPIME) 2 g in sodium chloride 0.9 % 100 mL IVPB        2 g 200 mL/hr over 30 Minutes Intravenous Every 8 hours 05/15/21 1435     05/15/21 0800  piperacillin-tazobactam (ZOSYN) IVPB 3.375 g  Status:  Discontinued        3.375 g 12.5 mL/hr over 240 Minutes Intravenous Every 8 hours 05/15/21 0753 05/15/21 1426   05/14/21 2345  piperacillin-tazobactam (ZOSYN) IVPB 3.375 g        3.375 g 100 mL/hr over 30 Minutes Intravenous  Once 05/14/21 2336 05/15/21 0209       Subjective: States LE swelling has improved overnight. Still having R foot pain. R foot remains swollen but is  better today  Objective: Vitals:   05/15/21 2003 05/15/21 2302 05/16/21 0430 05/16/21 1156  BP: 113/79 115/85 100/69 113/78  Pulse: (!) 101 (!) 102 88 (!) 101  Resp: 19 16 18 18   Temp: 98.2 F (36.8 C) 98.2 F (36.8 C) 97.8 F (36.6 C) 98.6 F (37 C)  TempSrc: Oral  Oral Oral  SpO2: 99% 100% 100% 100%  Weight:      Height:        Intake/Output Summary (Last 24 hours) at 05/16/2021 1526 Last data filed at 05/16/2021 1512 Gross per 24 hour  Intake 3294.97 ml  Output --  Net 3294.97 ml   Filed Weights   05/14/21 2231  Weight: 73 kg    Examination: General exam: Awake, laying in bed, in nad Respiratory system: Normal respiratory effort, no wheezing Cardiovascular system: regular rate, s1, s2 Gastrointestinal system: Soft, nondistended, positive BS Central nervous system: CN2-12 grossly intact, strength intact Extremities: Perfused, no clubbing, R foot with erythema, edema. R leg edema improved Skin: Normal skin turgor, no notable skin lesions seen Psychiatry: Mood normal // no visual hallucinations   Data Reviewed: I have personally reviewed following labs and imaging studies  CBC: Recent Labs  Lab 05/14/21 2254 05/15/21 0419  WBC 9.9 9.6  NEUTROABS 8.4* 7.5  HGB 9.9* 8.6*  HCT 30.5* 26.5*  MCV 95.0 95.0  PLT 202 [id number]   Basic Metabolic Panel: Recent Labs  Lab 05/14/21 2254 05/15/21 0419  NA 127* 130*  K 4.9 4.1  CL 93* 98  CO2 23 24  GLUCOSE 505* 384*  BUN 21* 16  CREATININE 1.25* 1.07*  CALCIUM 9.0 8.4*   GFR: Estimated Creatinine Clearance: 66.5 mL/min (A) (by C-G formula based on SCr of 1.07 mg/dL (H)). Liver Function Tests: Recent Labs  Lab 05/14/21 2254  AST 23  ALT 13  ALKPHOS 85  BILITOT 1.3*  PROT 7.8  ALBUMIN 3.5   No results for input(s): LIPASE, AMYLASE in the last 168 hours. No results for input(s): AMMONIA in the last 168 hours. Coagulation Profile: No results for input(s): INR, PROTIME in the last 168 hours. Cardiac  Enzymes: No results for input(s): CKTOTAL, CKMB, CKMBINDEX, TROPONINI in the last 168 hours. BNP (last 3 results) No results for input(s): PROBNP in the last 8760 hours. HbA1C: No results for input(s): HGBA1C in the last 72 hours. CBG: Recent Labs  Lab 05/15/21 2259 05/16/21 0015 05/16/21 0425 05/16/21 0729 05/16/21 1153  GLUCAP 376* 381* 334* 245* 222*   Lipid Profile: No results for input(s): CHOL, HDL, LDLCALC, TRIG, CHOLHDL, LDLDIRECT in the last 72 hours. Thyroid Function Tests: No results for input(s): TSH, T4TOTAL, FREET4, T3FREE, THYROIDAB in the last 72 hours. Anemia Panel: No results for input(s): VITAMINB12, FOLATE, FERRITIN, TIBC, IRON, RETICCTPCT in the last 72 hours. Sepsis Labs: Recent Labs  Lab 05/14/21 2254 05/15/21 0419 05/15/21 1111  LATICACIDVEN 1.1 1.3 1.2    Recent Results (from the past 240 hour(s))  Culture, blood (routine x 2)  Status: None (Preliminary result)   Collection Time: 05/14/21 10:55 PM   Specimen: BLOOD  Result Value Ref Range Status   Specimen Description   Final    BLOOD LEFT ANTECUBITAL Performed at [facility], [address] [place], [place] [address]    Special Requests   Final    BOTTLES DRAWN AEROBIC AND ANAEROBIC Blood Culture results may not be optimal due to an inadequate volume of blood received in culture bottles Performed at [facility], [address] [place], [place] [address]    Culture   Final    NO GROWTH < 24 HOURS Performed at [facility], [address] [place], [place] [address]    Report Status PENDING  Incomplete  Culture, blood (routine x 2)     Status: None (Preliminary result)   Collection Time: 05/14/21 10:55 PM   Specimen: BLOOD  Result Value Ref Range Status   Specimen Description   Final    BLOOD SITE NOT SPECIFIED Performed at [facility], [address] [place], [place] [address]    Special Requests   Final    BOTTLES DRAWN AEROBIC ONLY  Blood Culture adequate volume Performed at [facility], [address] [place], [place] [address]    Culture   Final    NO GROWTH < 24 HOURS Performed at [facility], [address] [place] [address] [place], [place] [address]    Report Status PENDING  Incomplete  Resp Panel by RT-PCR (Flu A&B, Covid) Nasopharyngeal Swab     Status: None   Collection Time: 05/15/21 12:07 AM   Specimen: Nasopharyngeal Swab; Nasopharyngeal(NP) swabs in vial transport medium  Result Value Ref Range Status   SARS Coronavirus 2 by RT PCR NEGATIVE NEGATIVE Final    Comment: (NOTE) SARS-CoV-2 target nucleic acids are NOT DETECTED.  The SARS-CoV-2 RNA is generally detectable in upper respiratory specimens during the acute phase of infection. The lowest concentration of SARS-CoV-2 viral copies this assay can detect is 138 copies/mL. A negative result does not preclude SARS-Cov-2 infection and should not be used as the sole basis for treatment or other patient management decisions. A negative result may occur with  improper specimen collection/handling, submission of specimen other than nasopharyngeal swab, presence of viral mutation(s) within the areas targeted by this assay, and inadequate number of viral copies(<138 copies/mL). A negative result must be combined with clinical observations, patient history, and epidemiological information. The expected result is Negative.  Fact Sheet for Patients:  BloggerCourse.com  Fact Sheet for Healthcare Providers:  SeriousBroker.it  This test is no t yet approved or cleared by the [place] FDA and  has been authorized for detection and/or diagnosis of SARS-CoV-2 by FDA under an Emergency Use Authorization (EUA). This EUA will remain  in effect (meaning this test can be used) for the duration of the COVID-19 declaration under Section 564(b)(1) of the Act, 21 U.S.C.section 360bbb-3(b)(1), unless the  authorization is terminated  or revoked sooner.       Influenza A by PCR NEGATIVE NEGATIVE Final   Influenza B by PCR NEGATIVE NEGATIVE Final    Comment: (NOTE) The Xpert Xpress SARS-CoV-2/FLU/RSV plus assay is intended as an aid in the diagnosis of influenza from Nasopharyngeal swab specimens and should not be used as a sole basis for treatment. Nasal washings and aspirates are unacceptable for Xpert Xpress SARS-CoV-2/FLU/RSV testing.  Fact Sheet for Patients: BloggerCourse.com  Fact Sheet for  Healthcare Providers: IncredibleEmployment.be  This test is not yet approved or cleared by the [place] and has been authorized for detection and/or diagnosis of SARS-CoV-2 by FDA under an Emergency Use Authorization (EUA). This EUA will remain in effect (meaning this test can be used) for the duration of the COVID-19 declaration under Section 564(b)(1) of the Act, 21 U.S.C. section 360bbb-3(b)(1), unless the authorization is terminated or revoked.  Performed at [facility], [address], [place], [place] [address]      Radiology Studies: DG Tibia/Fibula Right  Result Date: 05/15/2021 CLINICAL DATA:  Right lower extremity pain.  Sepsis. EXAM: RIGHT TIBIA AND FIBULA - 2 VIEW; RIGHT FOOT - 2 VIEW COMPARISON:  None. FINDINGS: There is no acute fracture or dislocation. The bones are well mineralized. No arthritic changes. There is diffuse soft tissue swelling of the dorsum of the foot and diffuse subcutaneous edema of the leg suspicious for cellulitis. Clinical correlation is recommended. No radiopaque foreign object or soft tissue gas. IMPRESSION: 1. No acute osseous pathology. 2. Findings suspicious for cellulitis. Electronically Signed   By: Anner Crete M.D.   On: 05/15/2021 01:31   DG Chest Port 1 View  Result Date: 05/15/2021 CLINICAL DATA:  Leg pain.  Sepsis. EXAM: PORTABLE CHEST 1 VIEW COMPARISON:  None. FINDINGS:  The cardiomediastinal contours are normal. The lungs are clear. Pulmonary vasculature is normal. No consolidation, pleural effusion, or pneumothorax. No acute osseous abnormalities are seen. IMPRESSION: Negative portable AP view of the chest. Electronically Signed   By: Keith Rake M.D.   On: 05/15/2021 01:30   DG Foot 2 Views Right  Result Date: 05/15/2021 CLINICAL DATA:  Right lower extremity pain.  Sepsis. EXAM: RIGHT TIBIA AND FIBULA - 2 VIEW; RIGHT FOOT - 2 VIEW COMPARISON:  None. FINDINGS: There is no acute fracture or dislocation. The bones are well mineralized. No arthritic changes. There is diffuse soft tissue swelling of the dorsum of the foot and diffuse subcutaneous edema of the leg suspicious for cellulitis. Clinical correlation is recommended. No radiopaque foreign object or soft tissue gas. IMPRESSION: 1. No acute osseous pathology. 2. Findings suspicious for cellulitis. Electronically Signed   By: Anner Crete M.D.   On: 05/15/2021 01:31   VAS [place] LOWER EXTREMITY VENOUS (DVT)  Result Date: 05/16/2021  Lower Venous DVT Study Patient Name:  ANET KENNEL  Date of Exam:   05/15/2021 Medical Rec #: [id number]       Accession #:    [id number] Date of Birth: 04-Dec-1975        Patient Gender: F Patient Age:   69 years Exam Location:  [facility] Procedure:      VAS [place] LOWER EXTREMITY VENOUS (DVT) Referring Phys: DAVID ORTIZ --------------------------------------------------------------------------------  Indications: Swelling.  Risk Factors: None identified. Comparison Study: No prior studies. Performing Technologist: Oliver Hum RVT  Examination Guidelines: A complete evaluation includes B-mode imaging, spectral Doppler, color Doppler, and power Doppler as needed of all accessible portions of each vessel. Bilateral testing is considered an integral part of a complete examination. Limited examinations for reoccurring indications may be performed as noted. The reflux portion of the  exam is performed with the patient in reverse Trendelenburg.  +---------+---------------+---------+-----------+----------+--------------+ RIGHT    CompressibilityPhasicitySpontaneityPropertiesThrombus Aging +---------+---------------+---------+-----------+----------+--------------+ CFV      Full           Yes      Yes                                 +---------+---------------+---------+-----------+----------+--------------+  SFJ      Full                                                        +---------+---------------+---------+-----------+----------+--------------+ FV Prox  Full                                                        +---------+---------------+---------+-----------+----------+--------------+ FV Mid   Full                                                        +---------+---------------+---------+-----------+----------+--------------+ FV DistalFull                                                        +---------+---------------+---------+-----------+----------+--------------+ PFV      Full                                                        +---------+---------------+---------+-----------+----------+--------------+ POP      Full           Yes      Yes                                 +---------+---------------+---------+-----------+----------+--------------+ PTV      Full                                                        +---------+---------------+---------+-----------+----------+--------------+ PERO     Full                                                        +---------+---------------+---------+-----------+----------+--------------+   +----+---------------+---------+-----------+----------+--------------+ LEFTCompressibilityPhasicitySpontaneityPropertiesThrombus Aging +----+---------------+---------+-----------+----------+--------------+ CFV Full           Yes      Yes                                  +----+---------------+---------+-----------+----------+--------------+     Summary: RIGHT: - There is no evidence of deep vein thrombosis in the lower extremity.  - No cystic structure found in the popliteal fossa.  LEFT: - No evidence of common femoral vein obstruction.  *See table(s) above for measurements and observations. Electronically signed by Deitra Mayo MD on  05/16/2021 at 7:36:54 AM.    Final     Scheduled Meds:  aspirin EC  81 mg Oral Daily   enoxaparin (LOVENOX) injection  40 mg Subcutaneous Q24H   famotidine  20 mg Oral BID   insulin aspart  0-15 Units Subcutaneous TID WC   insulin glargine-yfgn  20 Units Subcutaneous Daily   Continuous Infusions:  ceFEPime (MAXIPIME) IV 2 g (05/16/21 1329)   lactated ringers 100 mL/hr at 05/16/21 1119   metronidazole 500 mg (05/16/21 0938)     LOS: 1 day   Marylu Lund, MD [facility] Pager On Amion  If 7PM-7AM, please contact night-coverage 05/16/2021, 3:26 PM

## 2021-05-16 NOTE — Plan of Care (Signed)
  Problem: Clinical Measurements: Goal: Ability to maintain clinical measurements within normal limits will improve Outcome: Progressing   

## 2021-05-17 ENCOUNTER — Inpatient Hospital Stay (HOSPITAL_COMMUNITY): Payer: Self-pay

## 2021-05-17 DIAGNOSIS — E785 Hyperlipidemia, unspecified: Secondary | ICD-10-CM

## 2021-05-17 DIAGNOSIS — L089 Local infection of the skin and subcutaneous tissue, unspecified: Secondary | ICD-10-CM

## 2021-05-17 DIAGNOSIS — E11628 Type 2 diabetes mellitus with other skin complications: Secondary | ICD-10-CM

## 2021-05-17 DIAGNOSIS — E1065 Type 1 diabetes mellitus with hyperglycemia: Secondary | ICD-10-CM

## 2021-05-17 LAB — COMPREHENSIVE METABOLIC PANEL
ALT: 15 U/L (ref 0–44)
AST: 22 U/L (ref 15–41)
Albumin: 2.5 g/dL — ABNORMAL LOW (ref 3.5–5.0)
Alkaline Phosphatase: 73 U/L (ref 38–126)
Anion gap: 6 (ref 5–15)
BUN: 21 mg/dL — ABNORMAL HIGH (ref 6–20)
CO2: 22 mmol/L (ref 22–32)
Calcium: 8 mg/dL — ABNORMAL LOW (ref 8.9–10.3)
Chloride: 103 mmol/L (ref 98–111)
Creatinine, Ser: 1 mg/dL (ref 0.44–1.00)
GFR, Estimated: >60 mL/min (ref >60)
Glucose, Bld: 301 mg/dL — ABNORMAL HIGH (ref 70–99)
Potassium: 4.2 mmol/L (ref 3.5–5.1)
Sodium: 131 mmol/L — ABNORMAL LOW (ref 135–145)
Total Bilirubin: 0.4 mg/dL (ref 0.3–1.2)
Total Protein: 5.9 g/dL — ABNORMAL LOW (ref 6.5–8.1)

## 2021-05-17 LAB — GLUCOSE, CAPILLARY
Glucose-Capillary: 252 mg/dL — ABNORMAL HIGH (ref 70–99)
Glucose-Capillary: 208 mg/dL — ABNORMAL HIGH (ref 70–99)
Glucose-Capillary: 181 mg/dL — ABNORMAL HIGH (ref 70–99)
Glucose-Capillary: 228 mg/dL — ABNORMAL HIGH (ref 70–99)

## 2021-05-17 LAB — CBC
HCT: 25.1 % — ABNORMAL LOW (ref 36.0–46.0)
Hemoglobin: 7.8 g/dL — ABNORMAL LOW (ref 12.0–15.0)
MCH: 30.5 pg (ref 26.0–34.0)
MCHC: 31.1 g/dL (ref 30.0–36.0)
MCV: 98 fL (ref 80.0–100.0)
Platelets: 164 10*3/uL (ref 150–400)
RBC: 2.56 MIL/uL — ABNORMAL LOW (ref 3.87–5.11)
RDW: 13.8 % (ref 11.5–15.5)
WBC: 4.3 10*3/uL (ref 4.0–10.5)
nRBC: 0 % (ref 0.0–0.2)

## 2021-05-17 MED ORDER — HYDROMORPHONE HCL 1 MG/ML IJ SOLN
0.5000 mg | INTRAMUSCULAR | Status: AC | PRN
  Administered 2021-05-17 – 2021-05-18 (×2): 0.5 mg via INTRAVENOUS
  Filled 2021-05-17 (×2): qty 0.5

## 2021-05-17 MED ORDER — GADOBUTROL 1 MMOL/ML IV SOLN
8.0000 mL | Freq: Once | INTRAVENOUS | Status: AC | PRN
  Administered 2021-05-17: 8 mL via INTRAVENOUS

## 2021-05-17 MED ORDER — METRONIDAZOLE 500 MG PO TABS
500.0000 mg | ORAL_TABLET | Freq: Two times a day (BID) | ORAL | Status: DC
  Administered 2021-05-17 – 2021-05-19 (×4): 500 mg via ORAL
  Filled 2021-05-17 (×4): qty 1

## 2021-05-17 NOTE — Progress Notes (Signed)
Inpatient Diabetes Program Recommendations  AACE/ADA: New Consensus Statement on Inpatient Glycemic Control (2015)  Target Ranges:  Prepandial:   less than 140 mg/dL      Peak postprandial:   less than 180 mg/dL (1-2 hours)      Critically ill patients:  140 - 180 mg/dL   Lab Results  Component Value Date   GLUCAP 208 (H) 05/17/2021   HGBA1C 12.6 (H) 03/31/2021    Review of Glycemic Control Results for Abigail Moran, Abigail Moran (MRN [id number]) as of 05/17/2021 13:09  Ref. Range 05/16/2021 21:55 05/17/2021 07:36 05/17/2021 11:09  Glucose-Capillary Latest Ref Range: 70 - 99 mg/dL 409 (H) 735 (H) 329 (H)   Diabetes history: DM1 (requires basal, meal coverage, + correction) Outpatient Diabetes medications: Tresiba 20 units, Fiasp 3-4 units tid meal coverage Current orders for Inpatient glycemic control: Novolog 0-15 units TID, Semglee 20 units QD  Inpatient Diabetes Program Recommendations:    Consider increasing Semglee to 22 units QD, adding Novolog 3 units TID (assuming patient consuming >50% of meals) and adding Novolog 0-5 units QHS.  Thanks, Lujean Rave, MSN, RNC-OB Diabetes Coordinator [phone] (8a-5p)
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
## 2021-05-17 NOTE — Progress Notes (Signed)
PROGRESS NOTE    Abigail Moran  [id number] DOB: 15-Jul-1975 DOA: 05/14/2021 PCP: [name], NP   Brief Narrative: Abigail Moran is a 45 y.o. female with a history of allergies, erythropoietin deficiency anemia, DVT, menorrhagia, CKD stage IIIa, vitamin D deficiency. Patient presented secondary to right foot pain after injury to her fifth toe 2 weeks prior after giving herself a pedicure. Patient found to have evidence of cellulitis on admission now with development of an abscess.   Assessment & Plan:   Principal Problem:   Cellulitis of right lower extremity Active Problems:   Uncontrolled type 1 diabetes mellitus with hyperglycemia (HCC)   CAD (coronary artery disease)   Gastroparesis   Erythropoietin deficiency anemia   Hyperlipidemia   Hyponatremia   Hypocalcemia   Sepsis due to cellulitis (HCC)   Trichomoniasis, unspecified   Cellulitis Right foot abscess Patient empirically started on Zosyn on admission. Development of lateral right foot mass consistent with abscess -MRI right foot to rule out acute osteomyelitis -Orthopedic surgery consult for I&D -Continue Cefepime IV  Diabetes mellitus, type 1 Uncontrolled with hyperglycemia. Hemoglobin A1C of 12.6% from 03/2021. -Continue Semglee 20 units daily and SSI  Trichomoniasis -Continue Flagyl; switch to PO  Hyponatremia Mildly decreased. Asymptomatic. In setting of hyperglycemia. Improved with IV fluids and correction of blood sugar.  Hypocalcemia Mild. Asymptomatic. Corrected calcium for albumin of 9.2 on 11/9. Stable.  CAD -Continue aspirin  Gastroparesis Asymptomatic at this time. -Continue Zofran prn  Anemia secondary to erythropoietin deficiency Patient follows with nephrology as an outpatient. Hemoglobin drifted down. -CBC in AM  Hyperlipidemia Patient is prescribed Lipitor for which she is not taking as an outpatient.   DVT prophylaxis: Lovenox Code Status:   Code Status: Full  Code Family Communication: None at bedside Disposition Plan: Discharge home likely in 2-5 days pending orthopedic surgery recommendations, MRI results, antibiotics   Consultants:  Orthopedic surgery  Procedures:  None  Antimicrobials: Zosyn Flagyl Cefepime    Subjective: Continued right foot pain. Overall swelling in leg is better, though. Patient reports development of a blister on right foot  Objective: Vitals:   05/16/21 1156 05/16/21 2151 05/17/21 0424 05/17/21 1228  BP: 113/78 (!) 137/95 93/62 95/63   Pulse: (!) 101 (!) 110 92 98  Resp: 18 18 16 14   Temp: 98.6 F (37 C) 99.3 F (37.4 C) 98.9 F (37.2 C) 99.3 F (37.4 C)  TempSrc: Oral Oral Oral Oral  SpO2: 100% 100% 100% 100%  Weight:      Height:       No intake or output data in the 24 hours ending 05/17/21 1514 Filed Weights   05/14/21 2231  Weight: 73 kg    Examination:  General exam: Appears calm and comfortable Respiratory system: Clear to auscultation. Respiratory effort normal. Cardiovascular system: S1 & S2 heard, RRR. No murmurs, rubs, gallops or clicks. Gastrointestinal system: Abdomen is nondistended, soft and nontender. No organomegaly or masses felt. Normal bowel sounds heard. Central nervous system: Alert and oriented. No focal neurological deficits. Musculoskeletal: 2+ RLE edema, worse in foot. Fluctuant mass noted on lateral aspect of right foot near MTP joint. Skin: No cyanosis. No rashes Psychiatry: Judgement and insight appear normal. Mood & affect appropriate.     Data Reviewed: I have personally reviewed following labs and imaging studies  CBC Lab Results  Component Value Date   WBC 4.3 05/17/2021   RBC 2.56 (L) 05/17/2021   HGB 7.8 (L) 05/17/2021   HCT 25.1 (L) 05/17/2021  MCV 98.0 05/17/2021   MCH 30.5 05/17/2021   PLT 164 05/17/2021   MCHC 31.1 05/17/2021   RDW 13.8 05/17/2021   LYMPHSABS 1.1 05/15/2021   MONOABS 0.8 05/15/2021   EOSABS 0.0 05/15/2021   BASOSABS 0.0  [id number]     Last metabolic panel Lab Results  Component Value Date   NA 131 (L) 05/17/2021   K 4.2 05/17/2021   CL 103 05/17/2021   CO2 22 05/17/2021   BUN 21 (H) 05/17/2021   CREATININE 1.00 05/17/2021   GLUCOSE 301 (H) 05/17/2021   GFRNONAA >60 05/17/2021   GFRAA >60 03/19/2020   CALCIUM 8.0 (L) 05/17/2021   PROT 5.9 (L) 05/17/2021   ALBUMIN 2.5 (L) 05/17/2021   BILITOT 0.4 05/17/2021   ALKPHOS 73 05/17/2021   AST 22 05/17/2021   ALT 15 05/17/2021   ANIONGAP 6 05/17/2021    CBG (last 3)  Recent Labs    05/16/21 2155 05/17/21 0736 05/17/21 1109  GLUCAP 209* 252* 208*     GFR: Estimated Creatinine Clearance: 71.1 mL/min (by C-G formula based on SCr of 1 mg/dL).  Coagulation Profile: No results for input(s): INR, PROTIME in the last 168 hours.  Recent Results (from the past 240 hour(s))  Culture, blood (routine x 2)     Status: None (Preliminary result)   Collection Time: 05/14/21 10:55 PM   Specimen: BLOOD  Result Value Ref Range Status   Specimen Description   Final    BLOOD LEFT ANTECUBITAL Performed at [facility], [address] [place], [place] [address]    Special Requests   Final    BOTTLES DRAWN AEROBIC AND ANAEROBIC Blood Culture results may not be optimal due to an inadequate volume of blood received in culture bottles Performed at [facility], [address] [place], [place] [address]    Culture   Final    NO GROWTH 2 DAYS Performed at [facility], [place] [address], [place], [place] [address]    Report Status PENDING  Incomplete  Culture, blood (routine x 2)     Status: None (Preliminary result)   Collection Time: 05/14/21 10:55 PM   Specimen: BLOOD  Result Value Ref Range Status   Specimen Description   Final    BLOOD SITE NOT SPECIFIED Performed at [facility], [address] [place], [place] [address]    Special Requests   Final    BOTTLES DRAWN AEROBIC ONLY Blood Culture adequate  volume Performed at [facility], [address] [place], [place] [address]    Culture   Final    NO GROWTH 2 DAYS Performed at [facility], [place] [address], [place], [place] [address]    Report Status PENDING  Incomplete  Resp Panel by RT-PCR (Flu A&B, Covid) Nasopharyngeal Swab     Status: None   Collection Time: 05/15/21 12:07 AM   Specimen: Nasopharyngeal Swab; Nasopharyngeal(NP) swabs in vial transport medium  Result Value Ref Range Status   SARS Coronavirus 2 by RT PCR NEGATIVE NEGATIVE Final    Comment: (NOTE) SARS-CoV-2 target nucleic acids are NOT DETECTED.  The SARS-CoV-2 RNA is generally detectable in upper respiratory specimens during the acute phase of infection. The lowest concentration of SARS-CoV-2 viral copies this assay can detect is 138 copies/mL. A negative result does not preclude SARS-Cov-2 infection and should not be used as the sole basis for treatment or other patient management decisions. A  negative result may occur with  improper specimen collection/handling, submission of specimen other than nasopharyngeal swab, presence of viral mutation(s) within the areas targeted by this assay, and inadequate number of viral copies(<138 copies/mL). A negative result must be combined with clinical observations, patient history, and epidemiological information. The expected result is Negative.  Fact Sheet for Patients:  EntrepreneurPulse.com.au  Fact Sheet for Healthcare Providers:  IncredibleEmployment.be  This test is no t yet approved or cleared by the [place] FDA and  has been authorized for detection and/or diagnosis of SARS-CoV-2 by FDA under an Emergency Use Authorization (EUA). This EUA will remain  in effect (meaning this test can be used) for the duration of the COVID-19 declaration under Section 564(b)(1) of the Act, 21 U.S.C.section 360bbb-3(b)(1), unless the authorization is terminated  or  revoked sooner.       Influenza A by PCR NEGATIVE NEGATIVE Final   Influenza B by PCR NEGATIVE NEGATIVE Final    Comment: (NOTE) The Xpert Xpress SARS-CoV-2/FLU/RSV plus assay is intended as an aid in the diagnosis of influenza from Nasopharyngeal swab specimens and should not be used as a sole basis for treatment. Nasal washings and aspirates are unacceptable for Xpert Xpress SARS-CoV-2/FLU/RSV testing.  Fact Sheet for Patients: EntrepreneurPulse.com.au  Fact Sheet for Healthcare Providers: IncredibleEmployment.be  This test is not yet approved or cleared by the [place] FDA and has been authorized for detection and/or diagnosis of SARS-CoV-2 by FDA under an Emergency Use Authorization (EUA). This EUA will remain in effect (meaning this test can be used) for the duration of the COVID-19 declaration under Section 564(b)(1) of the Act, 21 U.S.C. section 360bbb-3(b)(1), unless the authorization is terminated or revoked.  Performed at [facility], [address] [place], [place] [address]         Radiology Studies: VAS [place] LOWER EXTREMITY VENOUS (DVT)  Result Date: 05/16/2021  Lower Venous DVT Study Patient Name:  ABBIGALE WOULARD  Date of Exam:   05/15/2021 Medical Rec #: [id number]       Accession #:    [id number] Date of Birth: 01-25-1976        Patient Gender: F Patient Age:   12 years Exam Location:  [facility] Procedure:      VAS [place] LOWER EXTREMITY VENOUS (DVT) Referring Phys: DAVID ORTIZ --------------------------------------------------------------------------------  Indications: Swelling.  Risk Factors: None identified. Comparison Study: No prior studies. Performing Technologist: Oliver Hum RVT  Examination Guidelines: A complete evaluation includes B-mode imaging, spectral Doppler, color Doppler, and power Doppler as needed of all accessible portions of each vessel. Bilateral testing is considered an integral  part of a complete examination. Limited examinations for reoccurring indications may be performed as noted. The reflux portion of the exam is performed with the patient in reverse Trendelenburg.  +---------+---------------+---------+-----------+----------+--------------+ RIGHT    CompressibilityPhasicitySpontaneityPropertiesThrombus Aging +---------+---------------+---------+-----------+----------+--------------+ CFV      Full           Yes      Yes                                 +---------+---------------+---------+-----------+----------+--------------+ SFJ      Full                                                        +---------+---------------+---------+-----------+----------+--------------+  FV Prox  Full                                                        +---------+---------------+---------+-----------+----------+--------------+ FV Mid   Full                                                        +---------+---------------+---------+-----------+----------+--------------+ FV DistalFull                                                        +---------+---------------+---------+-----------+----------+--------------+ PFV      Full                                                        +---------+---------------+---------+-----------+----------+--------------+ POP      Full           Yes      Yes                                 +---------+---------------+---------+-----------+----------+--------------+ PTV      Full                                                        +---------+---------------+---------+-----------+----------+--------------+ PERO     Full                                                        +---------+---------------+---------+-----------+----------+--------------+   +----+---------------+---------+-----------+----------+--------------+ LEFTCompressibilityPhasicitySpontaneityPropertiesThrombus Aging  +----+---------------+---------+-----------+----------+--------------+ CFV Full           Yes      Yes                                 +----+---------------+---------+-----------+----------+--------------+     Summary: RIGHT: - There is no evidence of deep vein thrombosis in the lower extremity.  - No cystic structure found in the popliteal fossa.  LEFT: - No evidence of common femoral vein obstruction.  *See table(s) above for measurements and observations. Electronically signed by Deitra Mayo MD on 05/16/2021 at 7:36:54 AM.    Final         Scheduled Meds:  aspirin EC  81 mg Oral Daily   enoxaparin (LOVENOX) injection  40 mg Subcutaneous Q24H   famotidine  20 mg Oral BID   insulin aspart  0-15 Units Subcutaneous TID WC   insulin glargine-yfgn  20 Units Subcutaneous Daily  Continuous Infusions:  ceFEPime (MAXIPIME) IV 2 g (05/17/21 1457)   lactated ringers 100 mL/hr at 05/17/21 0849   metronidazole 500 mg (05/17/21 0940)     LOS: 2 days     Jacquelin Hawking, MD [facility] 05/17/2021, 3:14 PM  If 7PM-7AM, please contact night-coverage [email or web address]

## 2021-05-18 DIAGNOSIS — L02611 Cutaneous abscess of right foot: Secondary | ICD-10-CM

## 2021-05-18 DIAGNOSIS — L03115 Cellulitis of right lower limb: Secondary | ICD-10-CM

## 2021-05-18 LAB — GLUCOSE, CAPILLARY
Glucose-Capillary: 187 mg/dL — ABNORMAL HIGH (ref 70–99)
Glucose-Capillary: 150 mg/dL — ABNORMAL HIGH (ref 70–99)
Glucose-Capillary: 160 mg/dL — ABNORMAL HIGH (ref 70–99)
Glucose-Capillary: 265 mg/dL — ABNORMAL HIGH (ref 70–99)

## 2021-05-18 LAB — CBC
HCT: 25.1 % — ABNORMAL LOW (ref 36.0–46.0)
Hemoglobin: 7.9 g/dL — ABNORMAL LOW (ref 12.0–15.0)
MCH: 30.3 pg (ref 26.0–34.0)
MCHC: 31.5 g/dL (ref 30.0–36.0)
MCV: 96.2 fL (ref 80.0–100.0)
Platelets: 191 10*3/uL (ref 150–400)
RBC: 2.61 MIL/uL — ABNORMAL LOW (ref 3.87–5.11)
RDW: 13.6 % (ref 11.5–15.5)
WBC: 4.9 10*3/uL (ref 4.0–10.5)
nRBC: 0 % (ref 0.0–0.2)

## 2021-05-18 NOTE — Progress Notes (Signed)
Inpatient Diabetes Program Recommendations  AACE/ADA: New Consensus Statement on Inpatient Glycemic Control (2015)  Target Ranges:  Prepandial:   less than 140 mg/dL      Peak postprandial:   less than 180 mg/dL (1-2 hours)      Critically ill patients:  140 - 180 mg/dL   Lab Results  Component Value Date   GLUCAP 187 (H) 05/18/2021   HGBA1C 12.6 (H) 03/31/2021    Review of Glycemic Control Results for Abigail Moran, Abigail Moran (MRN [id number]) as of 05/18/2021 11:00  Ref. Range 05/17/2021 16:40 05/17/2021 21:08 05/18/2021 07:45  Glucose-Capillary Latest Ref Range: 70 - 99 mg/dL 324 (H) 401 (H) 027 (H)   Diabetes history: DM1 (requires basal, meal coverage, + correction) Outpatient Diabetes medications: Tresiba 20 units, Fiasp 3-4 units tid meal coverage Current orders for Inpatient glycemic control: Novolog 0-15 units TID, Semglee 20 units QD   Inpatient Diabetes Program Recommendations:     Consider increasing Semglee to 22 units QD, adding Novolog 3 units TID (assuming patient consuming >50% of meals) and adding Novolog 0-5 units QHS.  Given inconsistencies with insulin prior to admission, would recommend that patient use Novolin 70/30 at discharge. Secure chat sent to MD.  Thanks, Lujean Rave, MSN, RNC-OB Diabetes Coordinator [phone] (8a-5p)

## 2021-05-18 NOTE — Progress Notes (Signed)
Pharmacy Antibiotic Note  Abigail Moran is a 45 y.o. female admitted on 05/14/2021 with leg swelling. Patient with PMH of provoked DVT in 2020 treated with 3 months of anticoagulation and recurrent leg infections. Zosyn one time dose given in ED. Pharmacy has been consulted for Zosyn dosing.  Update 05/18/21 8:01 AM - MRI w/o definitive evidence of septic joint or OM - SCr stable  - Tm(24h): 99.3 - WBC 4.9  Plan: Continue cefepime 2 g iv q 8 hours  Flagyl 500 mg iv q 12 hours per MD  Height: 5' 5.5" (166.4 cm) Weight: 73 kg (160 lb 15 oz) IBW/kg (Calculated) : 58.15  Temp (24hrs), Avg:99.3 F (37.4 C), Min:99.2 F (37.3 C), Max:99.3 F (37.4 C)  Recent Labs  Lab 05/14/21 2254 05/15/21 0419 05/15/21 1111 05/17/21 0438 05/18/21 0433  WBC 9.9 9.6  --  4.3 4.9  CREATININE 1.25* 1.07*  --  1.00  --   LATICACIDVEN 1.1 1.3 1.2  --   --      Estimated Creatinine Clearance: 71.1 mL/min (by C-G formula based on SCr of 1 mg/dL).    Allergies  Allergen Reactions   Latex Itching   Tape Hives    Antimicrobials this admission: Zosyn 11/7  Cefepime 11/7 >> Flagyl 11/7 >>  Dose adjustments this admission:  Microbiology results: 11/6 Bcx: NGTD 11/10 abscess:   Thank you for allowing pharmacy to be a part of this patient's care.  Luisa Hart D  05/18/2021 8:01 AM

## 2021-05-18 NOTE — Progress Notes (Signed)
PROGRESS NOTE    Abigail Moran  [id number] DOB: [birth year] DOA: 05/14/2021 PCP: [name], NP   Brief Narrative: Abigail Moran is a 45 y.o. female with a history of allergies, erythropoietin deficiency anemia, DVT, menorrhagia, CKD stage IIIa, vitamin D deficiency. Patient presented secondary to right foot pain after injury to her fifth toe 2 weeks prior after giving herself a pedicure. Patient found to have evidence of cellulitis on admission now with development of an abscess.   Assessment & Plan:   Principal Problem:   Cellulitis of right lower extremity Active Problems:   Uncontrolled type 1 diabetes mellitus with hyperglycemia (HCC)   CAD (coronary artery disease)   Gastroparesis   Erythropoietin deficiency anemia   Hyperlipidemia   Hyponatremia   Hypocalcemia   Sepsis due to cellulitis (HCC)   Trichomoniasis, unspecified   Cutaneous abscess of right foot   Cellulitis Right foot abscess Patient empirically started on Zosyn on admission. Development of lateral right foot mass consistent with abscess. Orthopedic surgery consulted on 11/9 and performed I&D. Wound cultures pending. MRI obtained on 11/9 and significant for no deep infection or osteomyelitis. -Orthopedic surgery recommendations: follow-up cultures -Continue Cefepime IV; narrow once culture data is available  Diabetes mellitus, type 1 Uncontrolled with hyperglycemia. Hemoglobin A1C of 12.6% from 03/2021. -Continue Semglee 20 units daily and SSI  Trichomoniasis -Continue Flagyl; switch to PO  Hyponatremia Mildly decreased. Asymptomatic. In setting of hyperglycemia. Improved with IV fluids and correction of blood sugar.  Hypocalcemia Mild. Asymptomatic. Corrected calcium for albumin of 9.2 on 11/9. Stable.  CAD -Continue aspirin  Gastroparesis Asymptomatic at this time. -Continue Zofran prn  Anemia secondary to erythropoietin deficiency Patient follows with nephrology as an  outpatient. Hemoglobin drifted down but is stable.  Hyperlipidemia Patient is prescribed Lipitor for which she is not taking as an outpatient.   DVT prophylaxis: Lovenox Code Status:   Code Status: Full Code Family Communication: None at bedside Disposition Plan: Discharge home likely in 1-2 days pending orthopedic surgery recommendations, wound culture results/transition to oral antibiotics   Consultants:  Orthopedic surgery  Procedures:  INCISION AND DRAINAGE (11/10)  Antimicrobials: Zosyn Flagyl Cefepime    Subjective: Swelling of right foot is improved with elevation. I&D performed this morning. No concerns today.  Objective: Vitals:   05/16/21 2151 05/17/21 0424 05/17/21 1228 05/18/21 0627  BP: (!) 137/95 93/62 95/63  101/65  Pulse: (!) 110 92 98 (!) 101  Resp: 18 16 14 18   Temp: 99.3 F (37.4 C) 98.9 F (37.2 C) 99.3 F (37.4 C) 99.2 F (37.3 C)  TempSrc: Oral Oral Oral Oral  SpO2: 100% 100% 100% 100%  Weight:      Height:        Intake/Output Summary (Last 24 hours) at 05/18/2021 1223 Last data filed at 05/18/2021 0612 Gross per 24 hour  Intake 4044.61 ml  Output --  Net 4044.61 ml   Filed Weights   05/14/21 2231  Weight: 73 kg    Examination:  General exam: Appears calm and comfortable Respiratory system: Clear to auscultation. Respiratory effort normal. Cardiovascular system: S1 & S2 heard, RRR. No murmurs, rubs, gallops or clicks. Gastrointestinal system: Abdomen is nondistended, soft and nontender. No organomegaly or masses felt. Normal bowel sounds heard. Central nervous system: Alert and oriented. No focal neurological deficits. Musculoskeletal: RLE 2+ pitting edema with some mild tenderness and warmth. Right foot in bandage dressing Skin: No cyanosis. No rashes Psychiatry: Judgement and insight appear normal. Mood & affect appropriate.  Data Reviewed: I have personally reviewed following labs and imaging studies  CBC Lab Results   Component Value Date   WBC 4.9 05/18/2021   RBC 2.61 (L) 05/18/2021   HGB 7.9 (L) 05/18/2021   HCT 25.1 (L) 05/18/2021   MCV 96.2 05/18/2021   MCH 30.3 05/18/2021   PLT 191 05/18/2021   MCHC 31.5 05/18/2021   RDW 13.6 05/18/2021   LYMPHSABS 1.1 05/15/2021   MONOABS 0.8 05/15/2021   EOSABS 0.0 05/15/2021   BASOSABS 0.0 [id number]     Last metabolic panel Lab Results  Component Value Date   NA 131 (L) 05/17/2021   K 4.2 05/17/2021   CL 103 05/17/2021   CO2 22 05/17/2021   BUN 21 (H) 05/17/2021   CREATININE 1.00 05/17/2021   GLUCOSE 301 (H) 05/17/2021   GFRNONAA >60 05/17/2021   GFRAA >60 03/19/2020   CALCIUM 8.0 (L) 05/17/2021   PROT 5.9 (L) 05/17/2021   ALBUMIN 2.5 (L) 05/17/2021   BILITOT 0.4 05/17/2021   ALKPHOS 73 05/17/2021   AST 22 05/17/2021   ALT 15 05/17/2021   ANIONGAP 6 05/17/2021    CBG (last 3)  Recent Labs    05/17/21 2108 05/18/21 0745 05/18/21 1137  GLUCAP 228* 187* 150*      GFR: Estimated Creatinine Clearance: 71.1 mL/min (by C-G formula based on SCr of 1 mg/dL).  Coagulation Profile: No results for input(s): INR, PROTIME in the last 168 hours.  Recent Results (from the past 240 hour(s))  Culture, blood (routine x 2)     Status: None (Preliminary result)   Collection Time: 05/14/21 10:55 PM   Specimen: BLOOD  Result Value Ref Range Status   Specimen Description   Final    BLOOD LEFT ANTECUBITAL Performed at [facility], [address] [place], [place] [address]    Special Requests   Final    BOTTLES DRAWN AEROBIC AND ANAEROBIC Blood Culture results may not be optimal due to an inadequate volume of blood received in culture bottles Performed at [facility], [address] [place], [place] [address]    Culture   Final    NO GROWTH 3 DAYS Performed at [facility], [place] [address], [place], [place] [address]    Report Status PENDING  Incomplete  Culture, blood (routine x 2)     Status: None  (Preliminary result)   Collection Time: 05/14/21 10:55 PM   Specimen: BLOOD  Result Value Ref Range Status   Specimen Description   Final    BLOOD SITE NOT SPECIFIED Performed at [facility], [address] [place], [place] [address]    Special Requests   Final    BOTTLES DRAWN AEROBIC ONLY Blood Culture adequate volume Performed at [facility], [address] [place], [place] [address]    Culture   Final    NO GROWTH 3 DAYS Performed at [facility], [place] [address], [place], [place] [address]    Report Status PENDING  Incomplete  Resp Panel by RT-PCR (Flu A&B, Covid) Nasopharyngeal Swab     Status: None   Collection Time: 05/15/21 12:07 AM   Specimen: Nasopharyngeal Swab; Nasopharyngeal(NP) swabs in vial transport medium  Result Value Ref Range Status   SARS Coronavirus 2 by RT PCR NEGATIVE NEGATIVE Final    Comment: (NOTE) SARS-CoV-2 target nucleic acids are NOT DETECTED.  The SARS-CoV-2 RNA is generally detectable in upper respiratory specimens  during the acute phase of infection. The lowest concentration of SARS-CoV-2 viral copies this assay can detect is 138 copies/mL. A negative result does not preclude SARS-Cov-2 infection and should not be used as the sole basis for treatment or other patient management decisions. A negative result may occur with  improper specimen collection/handling, submission of specimen other than nasopharyngeal swab, presence of viral mutation(s) within the areas targeted by this assay, and inadequate number of viral copies(<138 copies/mL). A negative result must be combined with clinical observations, patient history, and epidemiological information. The expected result is Negative.  Fact Sheet for Patients:  BloggerCourse.com  Fact Sheet for Healthcare Providers:  SeriousBroker.it  This test is no t yet approved or cleared by the [place] FDA and  has  been authorized for detection and/or diagnosis of SARS-CoV-2 by FDA under an Emergency Use Authorization (EUA). This EUA will remain  in effect (meaning this test can be used) for the duration of the COVID-19 declaration under Section 564(b)(1) of the Act, 21 U.S.C.section 360bbb-3(b)(1), unless the authorization is terminated  or revoked sooner.       Influenza A by PCR NEGATIVE NEGATIVE Final   Influenza B by PCR NEGATIVE NEGATIVE Final    Comment: (NOTE) The Xpert Xpress SARS-CoV-2/FLU/RSV plus assay is intended as an aid in the diagnosis of influenza from Nasopharyngeal swab specimens and should not be used as a sole basis for treatment. Nasal washings and aspirates are unacceptable for Xpert Xpress SARS-CoV-2/FLU/RSV testing.  Fact Sheet for Patients: BloggerCourse.com  Fact Sheet for Healthcare Providers: SeriousBroker.it  This test is not yet approved or cleared by the [place] FDA and has been authorized for detection and/or diagnosis of SARS-CoV-2 by FDA under an Emergency Use Authorization (EUA). This EUA will remain in effect (meaning this test can be used) for the duration of the COVID-19 declaration under Section 564(b)(1) of the Act, 21 U.S.C. section 360bbb-3(b)(1), unless the authorization is terminated or revoked.  Performed at [facility], [address] [place], [place] [address]          Radiology Studies: MR FOOT RIGHT W WO CONTRAST  Result Date: 05/17/2021 CLINICAL DATA:  Foot swelling, diabetic, osteomyelitis suspected. Dorsal foot pain and swelling. EXAM: MRI OF THE RIGHT FOREFOOT WITHOUT AND WITH CONTRAST TECHNIQUE: Multiplanar, multisequence MR imaging of the right forefoot was performed before and after the administration of intravenous contrast. CONTRAST:  4mL GADAVIST GADOBUTROL 1 MMOL/ML IV SOLN COMPARISON:  Radiographs 05/15/2021 and 03/17/2020. FINDINGS: Bones/Joint/Cartilage  No evidence of acute fracture, dislocation or cortical destruction. There is incomplete fat saturation within the small toe, but no definite abnormal marrow enhancement. No significant joint effusions or abnormal synovial enhancement. The alignment is normal at the Lisfranc joint. Ligaments The collateral ligaments of the metatarsophalangeal joints appear intact. The Lisfranc ligament is intact. Muscles and Tendons Mild generalized muscular T2 hyperintensity attributed to diabetes. No suspicious enhancement or tenosynovitis. Soft tissues There is a large superficial area of skin blistering over the dorsal aspect of the 5th metatarsophalangeal joint, measuring approximately 3.1 x 1.0 x 3.5 cm. There are inflammatory changes in the underlying subcutaneous fat with heterogeneous enhancement and small fluid collections dorsal to the 5th metatarsophalangeal joint. No other focal fluid collections are identified. There is diffuse subcutaneous edema and enhancement throughout the dorsum of the foot. IMPRESSION: 1. Large focal area of skin blistering over the dorsal aspect of the 5th metatarsophalangeal joint with underlying inflammatory changes and ill-defined fluid in the subcutaneous  fat. 2. No definite evidence of septic joint or osteomyelitis. 3. Generalized dorsal subcutaneous edema and enhancement suspicious for cellulitis. Electronically Signed   By: Richardean Sale M.D.   On: 05/17/2021 20:30        Scheduled Meds:  aspirin EC  81 mg Oral Daily   enoxaparin (LOVENOX) injection  40 mg Subcutaneous Q24H   famotidine  20 mg Oral BID   insulin aspart  0-15 Units Subcutaneous TID WC   insulin glargine-yfgn  20 Units Subcutaneous Daily   metroNIDAZOLE  500 mg Oral Q12H   Continuous Infusions:  ceFEPime (MAXIPIME) IV 2 g (05/18/21 0502)   lactated ringers 100 mL/hr at 05/18/21 0817     LOS: 3 days     Cordelia Poche, MD [facility] 05/18/2021, 12:23 PM  If 7PM-7AM, please contact  night-coverage [email or web address]

## 2021-05-18 NOTE — Consult Note (Signed)
ORTHOPAEDIC CONSULTATION  REQUESTING PHYSICIAN: Mariel Aloe, MD  Chief Complaint: Abscess right foot  HPI: Abigail Moran is a 45 y.o. female who presents with rapidly expanding abscess right foot.  Patient states this was secondary to increased pressure from her shoe wear.  Past Medical History:  Diagnosis Date   Allergy    seasonal allergies   Anemia    on meds   Blood transfusion without reported diagnosis 2008   Cellulitis and abscess of foot 03/18/2020   right foot   Diabetes mellitus without complication ([facility])    on meds   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   Heart attack ([place]) 05/04/15   History of DVT (deep vein thrombosis)    following hospitalization 2020   History of kidney stones    Menorrhagia with irregular cycle 10/11/2020   Renal disorder    Vitamin D deficiency 09/14/2014   Past Surgical History:  Procedure Laterality Date   CARDIAC CATHETERIZATION  2016   CHOLECYSTECTOMY  2001   WISDOM TOOTH EXTRACTION     Social History   Socioeconomic History   Marital status: Single    Spouse name: Not on file   Number of children: Not on file   Years of education: Not on file   Highest education level: Not on file  Occupational History   Not on file  Tobacco Use   Smoking status: Never   Smokeless tobacco: Never  Vaping Use   Vaping Use: Never used  Substance and Sexual Activity   Alcohol use: Not Currently    Comment: 3 x per month   Drug use: Not Currently    Frequency: 2.0 times per week    Types: Marijuana   Sexual activity: Not Currently  Other Topics Concern   Not on file  Social History Narrative   Works as Probation officer    Lives with common law husband   No children   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   Grew up in [place]   Social Determinants of Health   Financial Resource Strain: Not on file  Food Insecurity: Not on file  Transportation Needs: Not on file  Physical Activity: Not on file  Stress: Not on file  Social Connections: Not on  file   Family History  Problem Relation Age of Onset   Diabetes Father    Hypertension Father    Hyperlipidemia Father    Liver cancer Maternal Grandfather    Kidney disease Neg Hx    Heart disease Neg Hx    Colon polyps Neg Hx    Colon cancer Neg Hx    Stomach cancer Neg Hx    Rectal cancer Neg Hx    - negative except otherwise stated in the family history section Allergies  Allergen Reactions   Latex Itching   Tape Hives   Prior to Admission medications   Medication Sig Start Date End Date Taking? Authorizing Provider  aspirin EC 81 MG tablet Take 81 mg by mouth daily.   Yes [provider]  insulin aspart (FIASP FLEXTOUCH) 100 UNIT/ML FlexTouch Pen Inject 6-10 Units into the skin 3 (three) times daily between meals. Patient taking differently: Inject 4-10 Units into the skin 3 (three) times daily between meals. 10/14/20  Yes Philemon Kingdom, MD  insulin degludec (TRESIBA FLEXTOUCH) 200 UNIT/ML FlexTouch Pen Inject 34-36 Units into the skin daily. Patient taking differently: Inject 20 Units into the skin daily. 10/14/20  Yes Philemon Kingdom, MD  Multiple Vitamin (MULTIVITAMIN) tablet Take 1 tablet by  mouth daily.   Yes [provider]  atorvastatin (LIPITOR) 10 MG tablet Take 1 tablet (10 mg total) by mouth daily. Patient not taking: Reported on 05/15/2021 04/02/21   [name], NP  blood glucose meter kit and supplies KIT Dispense based on patient and insurance preference. Use up to four times daily as directed. E11.9 09/28/20   [name], NP  famotidine (PEPCID) 20 MG tablet Take 1 tablet (20 mg total) by mouth 2 (two) times daily. Patient not taking: Reported on 05/15/2021 05/01/21   Palumbo, April, MD  glucose blood (FREESTYLE LITE) test strip USE FOUR TIMES DAILY 10/14/20   Philemon Kingdom, MD  Insulin Pen Needle 32G X 4 MM MISC Use 4x a day 10/14/20   Philemon Kingdom, MD  Lancets (FREESTYLE) lancets Use 4x a day 09/28/20   [name], NP   MR FOOT RIGHT W WO CONTRAST  Result Date: 05/17/2021 CLINICAL DATA:  Foot swelling, diabetic, osteomyelitis suspected. Dorsal foot pain and swelling. EXAM: MRI OF THE RIGHT FOREFOOT WITHOUT AND WITH CONTRAST TECHNIQUE: Multiplanar, multisequence MR imaging of the right forefoot was performed before and after the administration of intravenous contrast. CONTRAST:  23m GADAVIST GADOBUTROL 1 MMOL/ML IV SOLN COMPARISON:  Radiographs 05/15/2021 and 03/17/2020. FINDINGS: Bones/Joint/Cartilage No evidence of acute fracture, dislocation or cortical destruction. There is incomplete fat saturation within the small toe, but no definite abnormal marrow enhancement. No significant joint effusions or abnormal synovial enhancement. The alignment is normal at the Lisfranc joint. Ligaments The collateral ligaments of the metatarsophalangeal joints appear intact. The Lisfranc ligament is intact. Muscles and Tendons Mild generalized muscular T2 hyperintensity attributed to diabetes. No suspicious enhancement or tenosynovitis. Soft tissues There is a large superficial area of skin blistering over the dorsal aspect of the 5th metatarsophalangeal joint, measuring approximately 3.1 x 1.0 x 3.5 cm. There are inflammatory changes in the underlying subcutaneous fat with heterogeneous enhancement and small fluid collections dorsal to the 5th metatarsophalangeal joint. No other focal fluid collections are identified. There is diffuse subcutaneous edema and enhancement throughout the dorsum of the foot. IMPRESSION: 1. Large focal area of skin blistering over the dorsal aspect of the 5th metatarsophalangeal joint with underlying inflammatory changes and ill-defined fluid in the subcutaneous fat. 2. No definite evidence of septic joint or osteomyelitis. 3. Generalized dorsal subcutaneous edema and enhancement suspicious for cellulitis. Electronically Signed   By: WRichardean SaleM.D.   On: 05/17/2021 20:30   - pertinent xrays,  CT, MRI studies were reviewed and independently interpreted  Positive ROS: All other systems have been reviewed and were otherwise negative with the exception of those mentioned in the HPI and as above.  Physical Exam: General: Alert, no acute distress Psychiatric: Patient is competent for consent with normal mood and affect Lymphatic: No axillary or cervical lymphadenopathy Cardiovascular: No pedal edema Respiratory: No cyanosis, no use of accessory musculature GI: No organomegaly, abdomen is soft and non-tender    Images:  '@ENCIMAGES' @  Labs:  Lab Results  Component Value Date   HGBA1C 12.6 (H) 03/31/2021   HGBA1C 10.7 (H) 08/31/2020   HGBA1C 9.9 (H) 03/18/2020   REPTSTATUS PENDING 05/14/2021   REPTSTATUS PENDING 05/14/2021   CULT  05/14/2021    NO GROWTH 2 DAYS Performed at [facility] [address], [place] [address]   CULT  05/14/2021    NO GROWTH 2 DAYS [facility]formed at [facility] [place] [address]  Lab Results  Component Value Date   ALBUMIN 2.5 (L) 05/17/2021   ALBUMIN 3.5 05/14/2021   ALBUMIN 3.9 05/01/2021     CBC EXTENDED Latest Ref Rng & Units 05/18/2021 05/17/2021 05/15/2021  WBC 4.0 - 10.5 K/uL 4.9 4.3 9.6  RBC 3.87 - 5.11 MIL/uL 2.61(L) 2.56(L) 2.79(L)  HGB 12.0 - 15.0 g/dL 7.9(L) 7.8(L) 8.6(L)  HCT 36.0 - 46.0 % 25.1(L) 25.1(L) 26.5(L)  PLT 150 - 400 K/uL 191 164 187  NEUTROABS 1.7 - 7.7 K/uL - - 7.5  LYMPHSABS 0.7 - 4.0 K/uL - - 1.1    Neurologic: Patient does not have protective sensation bilateral lower extremities.   MUSCULOSKELETAL:   Skin: Examination patient has a large blister ulcer over the dorsal lateral aspect of the right foot MTP joint.  Patient states that this blister has been rapidly expanding.  Patient has a palpable dorsalis pedis pulse she has redness and cellulitis in the right leg and right foot.  White blood cell count 4.9 hemoglobin 7.9 with a hemoglobin A1c of  12.6.  Review of the MRI scan does not show any infection of the bone does not show any abscess around the MTP joint.  The abscess appears superficial by MRI scan.  Debridement type: Excisional Debridement  Side: right  Body Location: foot   Tools used for debridement: scissors  Pre-debridement Wound size (cm):   Length: 0        Width: 0     Depth: 0   Post-debridement Wound size (cm):   Length: 4        Width: 4     Depth: 1   Debridement depth beyond dead/damaged tissue down to healthy viable tissue: yes  Tissue layer involved: skin  Nature of tissue removed: Purulence  Irrigation volume: 0     Irrigation fluid type: Other:    Patient had a superficial blister that was debrided there was a purulent abscess this did not communicate to the joint or bone or tendon.  This appeared to be a localized superficial abscess.    Assessment: Superficial abscess dorsal lateral aspect right foot MTP joint.  Plan: The abscess was decompressed there was healthy granulation tissue at the base this did not communicate with the joint or tendons.  Anticipate that antibiotic should resolve the infection.  The purulent abscess fluid was sent for cultures.  Thank you for the consult and the opportunity to see Ms. Lane, [place] [phone] 8:25 AM

## 2021-05-19 DIAGNOSIS — L0390 Cellulitis, unspecified: Secondary | ICD-10-CM

## 2021-05-19 DIAGNOSIS — A419 Sepsis, unspecified organism: Secondary | ICD-10-CM

## 2021-05-19 LAB — GLUCOSE, CAPILLARY
Glucose-Capillary: 319 mg/dL — ABNORMAL HIGH (ref 70–99)
Glucose-Capillary: 131 mg/dL — ABNORMAL HIGH (ref 70–99)
Glucose-Capillary: 143 mg/dL — ABNORMAL HIGH (ref 70–99)
Glucose-Capillary: 211 mg/dL — ABNORMAL HIGH (ref 70–99)

## 2021-05-19 MED ORDER — INSULIN ASPART 100 UNIT/ML IJ SOLN
5.0000 [IU] | Freq: Three times a day (TID) | INTRAMUSCULAR | Status: DC
  Administered 2021-05-19 – 2021-05-21 (×8): 5 [IU] via SUBCUTANEOUS

## 2021-05-19 MED ORDER — METRONIDAZOLE 500 MG PO TABS
500.0000 mg | ORAL_TABLET | Freq: Two times a day (BID) | ORAL | Status: DC
  Administered 2021-05-19 – 2021-05-21 (×4): 500 mg via ORAL
  Filled 2021-05-19 (×4): qty 1

## 2021-05-19 NOTE — TOC Progression Note (Signed)
Transition of Care [facility]) - Progression Note    Patient Details  Name: Abigail Moran MRN: [id number] Date of Birth: 1975/12/15  Transition of [facility]) CM/SW Contact  Golda Acre, RN Phone Number: 05/19/2021, 8:57 AM  Clinical Narrative:    45 y.o. female with a history of allergies, erythropoietin deficiency anemia, DVT, menorrhagia, CKD stage IIIa, vitamin D deficiency. Patient presented secondary to right foot pain after injury to her fifth toe 2 weeks prior after giving herself a pedicure. Patient found to have evidence of cellulitis on admission now with development of an abscess.     Assessment & Plan:   Principal Problem:   Cellulitis of right lower extremity Active Problems:   Uncontrolled type 1 diabetes mellitus with hyperglycemia (HCC)   CAD (coronary artery disease)   Gastroparesis   Erythropoietin deficiency anemia   Hyperlipidemia   Hyponatremia   Hypocalcemia   Sepsis due to cellulitis (HCC)   Trichomoniasis, unspecified   Cutaneous abscess of right foot     Cellulitis Right foot abscess Patient empirically started on Zosyn on admission. Development of lateral right foot mass consistent with abscess. Orthopedic surgery consulted on 11/9 and performed I&D. Wound cultures pending. MRI obtained on 11/9 and significant for no deep infection or osteomyelitis. -Orthopedic surgery recommendations: follow-up cultures -Continue Cefepime IV; narrow once culture data is available TOC PLAN OF CARE: Following for toc needs-hhc for iv abx?  Expected Discharge Plan: Home/Self Care Barriers to Discharge: Continued Medical Work up  Expected Discharge Plan and Services Expected Discharge Plan: Home/Self Care   Discharge Planning Services: CM Consult   Living arrangements for the past 2 months: Single Family Home                                       Social Determinants of Health (SDOH) Interventions    Readmission Risk Interventions No flowsheet  data found.

## 2021-05-19 NOTE — Progress Notes (Signed)
PROGRESS NOTE    Abigail Moran  ZOX:[id number] DOB: 30-Aug-1975 DOA: 05/14/2021 PCP: Sandford Craze, NP   Brief Narrative: Abigail Moran is a 45 y.o. female with a history of allergies, erythropoietin deficiency anemia, DVT, menorrhagia, CKD stage IIIa, vitamin D deficiency. Patient presented secondary to right foot pain after injury to her fifth toe 2 weeks prior after giving herself a pedicure. Patient found to have evidence of cellulitis on admission now with development of an abscess.   Assessment & Plan:   Principal Problem:   Cellulitis of right lower extremity Active Problems:   Uncontrolled type 1 diabetes mellitus with hyperglycemia (HCC)   CAD (coronary artery disease)   Gastroparesis   Erythropoietin deficiency anemia   Hyperlipidemia   Hyponatremia   Hypocalcemia   Sepsis due to cellulitis (HCC)   Trichomoniasis, unspecified   Cutaneous abscess of right foot   Cellulitis Right foot abscess Patient empirically started on Zosyn on admission. Development of lateral right foot mass consistent with abscess. Orthopedic surgery consulted on 11/9 and performed I&D. Wound cultures significant for staph aureus; likely MSSA since she has been getting better but sensitivities are pending. MRI obtained on 11/9 and significant for no deep infection or osteomyelitis. -Orthopedic surgery recommendations: follow-up cultures -Continue Cefepime IV; narrow once culture data is available  Diabetes mellitus, type 1 Uncontrolled with hyperglycemia. Hemoglobin A1C of 12.6% from 03/2021. -Continue Semglee 20 units daily and SSI -Start Novolog 5 units TID with meals  Trichomoniasis -Continue Flagyl; switch to PO  Hyponatremia Mildly decreased. Asymptomatic. In setting of hyperglycemia. Improved with IV fluids and correction of blood sugar.  Hypocalcemia Mild. Asymptomatic. Corrected calcium for albumin of 9.2 on 11/9. Stable.  CAD -Continue  aspirin  Gastroparesis Asymptomatic at this time. -Continue Zofran prn  Anemia secondary to erythropoietin deficiency Patient follows with nephrology as an outpatient. Hemoglobin drifted down but is stable.  Hyperlipidemia Patient is prescribed Lipitor for which she is not taking as an outpatient.   DVT prophylaxis: Lovenox Code Status:   Code Status: Full Code Family Communication: None at bedside Disposition Plan: Discharge home likely in 24 hours pending orthopedic surgery recommendations, wound culture results/transition to oral antibiotics   Consultants:  Orthopedic surgery  Procedures:  INCISION AND DRAINAGE (11/10)  Antimicrobials: Zosyn Flagyl Cefepime    Subjective: Pain and swelling of right foot is improving. Afebrile.  Objective: Vitals:   05/18/21 0627 05/18/21 1326 05/18/21 2057 05/19/21 0521  BP: 101/65 (!) 123/93 (!) 126/94 (!) 137/98  Pulse: (!) 101 96 (!) 101 99  Resp: 18 18 18 18   Temp: 99.2 F (37.3 C) 98.8 F (37.1 C) 98.3 F (36.8 C) 97.7 F (36.5 C)  TempSrc: Oral Oral Oral Axillary  SpO2: 100% 100% 99% 98%  Weight:      Height:        Intake/Output Summary (Last 24 hours) at 05/19/2021 1109 Last data filed at 05/18/2021 1500 Gross per 24 hour  Intake 452.57 ml  Output --  Net 452.57 ml    Filed Weights   05/14/21 2231  Weight: 73 kg    Examination:  General exam: Appears calm and comfortable and in no acute distress. Conversant Respiratory: Clear to auscultation. Respiratory effort normal with no intercostal retractions or use of accessory muscles Cardiovascular: S1 & S2 heard, RRR. No murmurs, rubs, gallops or clicks. RLE foot/ankle/lower leg edema Gastrointestinal: Abdomen is non-distended, soft and non-tender. No masses felt. Normal bowel sounds heard Neurologic: No focal neurological deficits Musculoskeletal: No calf tenderness  Skin: No cyanosis. No new rashes Psychiatry: Alert and oriented. Memory intact. Mood &  affect appropriate    Data Reviewed: I have personally reviewed following labs and imaging studies  CBC Lab Results  Component Value Date   WBC 4.9 05/18/2021   RBC 2.61 (L) 05/18/2021   HGB 7.9 (L) 05/18/2021   HCT 25.1 (L) 05/18/2021   MCV 96.2 05/18/2021   MCH 30.3 05/18/2021   PLT 191 05/18/2021   MCHC 31.5 05/18/2021   RDW 13.6 05/18/2021   LYMPHSABS 1.1 05/15/2021   MONOABS 0.8 05/15/2021   EOSABS 0.0 05/15/2021   BASOSABS 0.0 [id number]     Last metabolic panel Lab Results  Component Value Date   NA 131 (L) 05/17/2021   K 4.2 05/17/2021   CL 103 05/17/2021   CO2 22 05/17/2021   BUN 21 (H) 05/17/2021   CREATININE 1.00 05/17/2021   GLUCOSE 301 (H) 05/17/2021   GFRNONAA >60 05/17/2021   GFRAA >60 03/19/2020   CALCIUM 8.0 (L) 05/17/2021   PROT 5.9 (L) 05/17/2021   ALBUMIN 2.5 (L) 05/17/2021   BILITOT 0.4 05/17/2021   ALKPHOS 73 05/17/2021   AST 22 05/17/2021   ALT 15 05/17/2021   ANIONGAP 6 05/17/2021    CBG (last 3)  Recent Labs    05/18/21 1645 05/18/21 2054 05/19/21 0733  GLUCAP 160* 265* 319*      GFR: Estimated Creatinine Clearance: 71.1 mL/min (by C-G formula based on SCr of 1 mg/dL).  Coagulation Profile: No results for input(s): INR, PROTIME in the last 168 hours.  Recent Results (from the past 240 hour(s))  Culture, blood (routine x 2)     Status: None (Preliminary result)   Collection Time: 05/14/21 10:55 PM   Specimen: BLOOD  Result Value Ref Range Status   Specimen Description   Final    BLOOD LEFT ANTECUBITAL Performed at [facility], [address] [place], [place] [address]    Special Requests   Final    BOTTLES DRAWN AEROBIC AND ANAEROBIC Blood Culture results may not be optimal due to an inadequate volume of blood received in culture bottles Performed at [facility], [address] [place], [place] [address]    Culture   Final    NO GROWTH 4 DAYS Performed at [facility], [place]  [address], [place], [place] [address]    Report Status PENDING  Incomplete  Culture, blood (routine x 2)     Status: None (Preliminary result)   Collection Time: 05/14/21 10:55 PM   Specimen: BLOOD  Result Value Ref Range Status   Specimen Description   Final    BLOOD SITE NOT SPECIFIED Performed at [facility], [address] [place], [place] [address]    Special Requests   Final    BOTTLES DRAWN AEROBIC ONLY Blood Culture adequate volume Performed at [facility], [address] [place], [place] [address]    Culture   Final    NO GROWTH 4 DAYS Performed at [facility], [place] [address] [place], [place] [address]    Report Status PENDING  Incomplete  Resp Panel by RT-PCR (Flu A&B, Covid) Nasopharyngeal Swab     Status: None   Collection Time: 05/15/21 12:07 AM   Specimen: Nasopharyngeal Swab; Nasopharyngeal(NP) swabs in vial transport medium  Result Value Ref Range Status   SARS Coronavirus 2 by RT PCR NEGATIVE NEGATIVE Final    Comment: (  NOTE) SARS-CoV-2 target nucleic acids are NOT DETECTED.  The SARS-CoV-2 RNA is generally detectable in upper respiratory specimens during the acute phase of infection. The lowest concentration of SARS-CoV-2 viral copies this assay can detect is 138 copies/mL. A negative result does not preclude SARS-Cov-2 infection and should not be used as the sole basis for treatment or other patient management decisions. A negative result may occur with  improper specimen collection/handling, submission of specimen other than nasopharyngeal swab, presence of viral mutation(s) within the areas targeted by this assay, and inadequate number of viral copies(<138 copies/mL). A negative result must be combined with clinical observations, patient history, and epidemiological information. The expected result is Negative.  Fact Sheet for Patients:  BloggerCourse.com  Fact Sheet for Healthcare Providers:   SeriousBroker.it  This test is no t yet approved or cleared by the [place] FDA and  has been authorized for detection and/or diagnosis of SARS-CoV-2 by FDA under an Emergency Use Authorization (EUA). This EUA will remain  in effect (meaning this test can be used) for the duration of the COVID-19 declaration under Section 564(b)(1) of the Act, 21 U.S.C.section 360bbb-3(b)(1), unless the authorization is terminated  or revoked sooner.       Influenza A by PCR NEGATIVE NEGATIVE Final   Influenza B by PCR NEGATIVE NEGATIVE Final    Comment: (NOTE) The Xpert Xpress SARS-CoV-2/FLU/RSV plus assay is intended as an aid in the diagnosis of influenza from Nasopharyngeal swab specimens and should not be used as a sole basis for treatment. Nasal washings and aspirates are unacceptable for Xpert Xpress SARS-CoV-2/FLU/RSV testing.  Fact Sheet for Patients: BloggerCourse.com  Fact Sheet for Healthcare Providers: SeriousBroker.it  This test is not yet approved or cleared by the [place] FDA and has been authorized for detection and/or diagnosis of SARS-CoV-2 by FDA under an Emergency Use Authorization (EUA). This EUA will remain in effect (meaning this test can be used) for the duration of the COVID-19 declaration under Section 564(b)(1) of the Act, 21 U.S.C. section 360bbb-3(b)(1), unless the authorization is terminated or revoked.  Performed at [facility], [address] [place], [place] [address]   Aerobic/Anaerobic Culture w Gram Stain (surgical/deep wound)     Status: None (Preliminary result)   Collection Time: 05/18/21  7:35 AM   Specimen: Foot; Abscess  Result Value Ref Range Status   Specimen Description   Final    FOOT RIGHT Performed at [facility], [address], [place], [place] [address]    Special Requests   Final    Normal Performed at [facility], [address], [place], [place] [address]    Gram Stain   Final    FEW WBC PRESENT,BOTH PMN AND MONONUCLEAR RARE GRAM POSITIVE COCCI    Culture   Final    MODERATE STAPHYLOCOCCUS AUREUS SUSCEPTIBILITIES TO FOLLOW Performed at [facility], [address] [place], [place] [address]    Report Status PENDING  Incomplete         Radiology Studies: MR FOOT RIGHT W WO CONTRAST  Result Date: 05/17/2021 CLINICAL DATA:  Foot swelling, diabetic, osteomyelitis suspected. Dorsal foot pain and swelling. EXAM: MRI OF THE RIGHT FOREFOOT WITHOUT AND WITH CONTRAST TECHNIQUE: Multiplanar, multisequence MR imaging of the right forefoot was performed before and after the administration of intravenous contrast. CONTRAST:  24mL GADAVIST GADOBUTROL 1 MMOL/ML IV SOLN COMPARISON:  Radiographs 05/15/2021 and 03/17/2020. FINDINGS: Bones/Joint/Cartilage No evidence of acute fracture, dislocation  or cortical destruction. There is incomplete fat saturation within the small toe, but no definite abnormal marrow enhancement. No significant joint effusions or abnormal synovial enhancement. The alignment is normal at the Lisfranc joint. Ligaments The collateral ligaments of the metatarsophalangeal joints appear intact. The Lisfranc ligament is intact. Muscles and Tendons Mild generalized muscular T2 hyperintensity attributed to diabetes. No suspicious enhancement or tenosynovitis. Soft tissues There is a large superficial area of skin blistering over the dorsal aspect of the 5th metatarsophalangeal joint, measuring approximately 3.1 x 1.0 x 3.5 cm. There are inflammatory changes in the underlying subcutaneous fat with heterogeneous enhancement and small fluid collections dorsal to the 5th metatarsophalangeal joint. No other focal fluid collections are identified. There is diffuse subcutaneous edema and enhancement throughout the dorsum of the foot. IMPRESSION: 1. Large focal area of skin blistering  over the dorsal aspect of the 5th metatarsophalangeal joint with underlying inflammatory changes and ill-defined fluid in the subcutaneous fat. 2. No definite evidence of septic joint or osteomyelitis. 3. Generalized dorsal subcutaneous edema and enhancement suspicious for cellulitis. Electronically Signed   By: Richardean Sale M.D.   On: 05/17/2021 20:30        Scheduled Meds:  aspirin EC  81 mg Oral Daily   enoxaparin (LOVENOX) injection  40 mg Subcutaneous Q24H   famotidine  20 mg Oral BID   insulin aspart  0-15 Units Subcutaneous TID WC   insulin glargine-yfgn  20 Units Subcutaneous Daily   Continuous Infusions:  ceFEPime (MAXIPIME) IV 2 g (05/19/21 0553)   lactated ringers Stopped (05/18/21 1508)     LOS: 4 days     Cordelia Poche, MD [facility] 05/19/2021, 11:09 AM  If 7PM-7AM, please contact night-coverage [email or web address]

## 2021-05-20 LAB — GLUCOSE, CAPILLARY
Glucose-Capillary: 111 mg/dL — ABNORMAL HIGH (ref 70–99)
Glucose-Capillary: 118 mg/dL — ABNORMAL HIGH (ref 70–99)
Glucose-Capillary: 179 mg/dL — ABNORMAL HIGH (ref 70–99)
Glucose-Capillary: 167 mg/dL — ABNORMAL HIGH (ref 70–99)

## 2021-05-20 LAB — CULTURE, BLOOD (ROUTINE X 2)
Culture: NO GROWTH
Culture: NO GROWTH
Special Requests: ADEQUATE

## 2021-05-20 MED ORDER — AMOXICILLIN 250 MG PO CAPS: 500.0000 mg | ORAL_CAPSULE | Freq: Three times a day (TID) | ORAL | Status: DC

## 2021-05-20 MED ORDER — CEFADROXIL 500 MG PO CAPS
500.0000 mg | ORAL_CAPSULE | Freq: Two times a day (BID) | ORAL | Status: DC
  Administered 2021-05-20 – 2021-05-21 (×3): 500 mg via ORAL
  Filled 2021-05-20 (×4): qty 1

## 2021-05-20 NOTE — Consult Note (Addendum)
WOC Nurse Consult Note: Reason for Consult: Patient with wound noted at posterior (Plantar) aspect of left great toe. Dr. Lajoyce Corners consulted recently on right foot. Wound type: Linear break in integument noted by Bedside RN Pressure Injury POA: N/A Measurement:0.4cm x 3cm x 0.1cm Wound bed:red, moist Drainage (amount, consistency, odor) scant serous Periwound: edematous Dressing procedure/placement/frequency: I will provide conservative topical care guidance for the care of this wound using an antimicrobial nonadherent (xeroform) to the wound after cleansing, topped with dry gauze and secured with conform bandaging. I have communicated with Dr. Caleb Popp via Secure Chat and advised if the wound continues to be concerning that it would be appropriate for Dr. Lajoyce Corners to return for the contralateral foot.  WOC nursing team will not follow, but will remain available to this patient, the nursing and medical teams.  Please re-consult if needed. Thanks, Ladona Mow, MSN, RN, GNP, [place]  Pager# [phone]

## 2021-05-20 NOTE — Progress Notes (Signed)
PROGRESS NOTE    Abigail Moran  HFW:[id number] DOB: Jun 22, 1976 DOA: 05/14/2021 PCP: Sandford Craze, NP   Brief Narrative: Abigail Moran is a 45 y.o. female with a history of allergies, erythropoietin deficiency anemia, DVT, menorrhagia, CKD stage IIIa, vitamin D deficiency. Patient presented secondary to right foot pain after injury to her fifth toe 2 weeks prior after giving herself a pedicure. Patient found to have evidence of cellulitis on admission now with development of an abscess.   Assessment & Plan:   Principal Problem:   Cellulitis of right lower extremity Active Problems:   Uncontrolled type 1 diabetes mellitus with hyperglycemia (HCC)   CAD (coronary artery disease)   Gastroparesis   Erythropoietin deficiency anemia   Hyperlipidemia   Hyponatremia   Hypocalcemia   Sepsis due to cellulitis (HCC)   Trichomoniasis, unspecified   Cutaneous abscess of right foot   Cellulitis Right foot abscess Patient empirically started on Zosyn on admission. Development of lateral right foot mass consistent with abscess. Orthopedic surgery consulted on 11/9 and performed I&D. Wound cultures significant for staph aureus, MSSA. MRI obtained on 11/9 and significant for no deep infection or osteomyelitis. Transitioned to cefadroxil -Continue Cefadroxil  Left foot wound Does not appear actively infected. Painful.  -MRI left foot  Diabetes mellitus, type 1 Uncontrolled with hyperglycemia. Hemoglobin A1C of 12.6% from 03/2021. -Continue Semglee 20 units daily and SSI -Start Novolog 5 units TID with meals  Trichomoniasis -Continue Flagyl; switch to PO  Hyponatremia Mildly decreased. Asymptomatic. In setting of hyperglycemia. Improved with IV fluids and correction of blood sugar.  Hypocalcemia Mild. Asymptomatic. Corrected calcium for albumin of 9.2 on 11/9. Stable.  CAD -Continue aspirin  Gastroparesis Asymptomatic at this time. -Continue Zofran prn  Anemia secondary  to erythropoietin deficiency Patient follows with nephrology as an outpatient. Hemoglobin drifted down but is stable.  Hyperlipidemia Patient is prescribed Lipitor for which she is not taking as an outpatient.   DVT prophylaxis: Lovenox Code Status:   Code Status: Full Code Family Communication: None at bedside Disposition Plan: Discharge home likely in 24 hours pending left foot MRI   Consultants:  Orthopedic surgery  Procedures:  INCISION AND DRAINAGE (11/10)  Antimicrobials: Zosyn Flagyl Cefepime    Subjective: No issues overnight. Left leg swelling is improving slowly. Pain is improved.  Objective: Vitals:   05/19/21 1330 05/19/21 2026 05/20/21 0453 05/20/21 1311  BP: (!) 126/98 124/90 103/72 121/89  Pulse: 99 (!) 102 93 (!) 106  Resp: 18 16 16 16   Temp: 98.5 F (36.9 C) 98.5 F (36.9 C) 98.6 F (37 C) (!) 97.5 F (36.4 C)  TempSrc: Oral Oral Oral Oral  SpO2: 100% 100% 97% 100%  Weight:      Height:        Intake/Output Summary (Last 24 hours) at 05/20/2021 1523 Last data filed at 05/20/2021 1523 Gross per 24 hour  Intake 1019.81 ml  Output --  Net 1019.81 ml    Filed Weights   05/14/21 2231  Weight: 73 kg    Examination:  General exam: Appears calm and comfortable Respiratory system: Clear to auscultation. Respiratory effort normal. Cardiovascular system: S1 & S2 heard, RRR. No murmurs, rubs, gallops or clicks. Gastrointestinal system: Abdomen is nondistended, soft and nontender. No organomegaly or masses felt. Normal bowel sounds heard. Central nervous system: Alert and oriented. No focal neurological deficits. Musculoskeletal: RLE edema. No calf tenderness. Plantar surface of left MTP with open wound characterized by no erythema/drainage Skin: No cyanosis. No rashes  Psychiatry: Judgement and insight appear normal. Mood & affect appropriate.    Data Reviewed: I have personally reviewed following labs and imaging studies  CBC Lab Results   Component Value Date   WBC 4.9 05/18/2021   RBC 2.61 (L) 05/18/2021   HGB 7.9 (L) 05/18/2021   HCT 25.1 (L) 05/18/2021   MCV 96.2 05/18/2021   MCH 30.3 05/18/2021   PLT 191 05/18/2021   MCHC 31.5 05/18/2021   RDW 13.6 05/18/2021   LYMPHSABS 1.1 05/15/2021   MONOABS 0.8 05/15/2021   EOSABS 0.0 05/15/2021   BASOSABS 0.0 [id number]     Last metabolic panel Lab Results  Component Value Date   NA 131 (L) 05/17/2021   K 4.2 05/17/2021   CL 103 05/17/2021   CO2 22 05/17/2021   BUN 21 (H) 05/17/2021   CREATININE 1.00 05/17/2021   GLUCOSE 301 (H) 05/17/2021   GFRNONAA >60 05/17/2021   GFRAA >60 03/19/2020   CALCIUM 8.0 (L) 05/17/2021   PROT 5.9 (L) 05/17/2021   ALBUMIN 2.5 (L) 05/17/2021   BILITOT 0.4 05/17/2021   ALKPHOS 73 05/17/2021   AST 22 05/17/2021   ALT 15 05/17/2021   ANIONGAP 6 05/17/2021    CBG (last 3)  Recent Labs    05/19/21 2022 05/20/21 0739 05/20/21 1220  GLUCAP 211* 111* 118*      GFR: Estimated Creatinine Clearance: 71.1 mL/min (by C-G formula based on SCr of 1 mg/dL).  Coagulation Profile: No results for input(s): INR, PROTIME in the last 168 hours.  Recent Results (from the past 240 hour(s))  Culture, blood (routine x 2)     Status: None   Collection Time: 05/14/21 10:55 PM   Specimen: BLOOD  Result Value Ref [place] Status   Specimen Description   Final    BLOOD LEFT ANTECUBITAL Performed at [facility], [address] [place], [place] [address]    Special Requests   Final    BOTTLES DRAWN AEROBIC AND ANAEROBIC Blood Culture results may not be optimal due to an inadequate volume of blood received in culture bottles Performed at [facility], [address] [place], [place] [address]    Culture   Final    NO GROWTH 5 DAYS Performed at [facility], [place] [address] [place], [place] [address]    Report Status 05/20/2021 FINAL  Final  Culture, blood (routine x 2)     Status: None   Collection Time:  05/14/21 10:55 PM   Specimen: BLOOD  Result Value Ref [place] Status   Specimen Description   Final    BLOOD SITE NOT SPECIFIED Performed at [facility], [address] [place], [place] [address]    Special Requests   Final    BOTTLES DRAWN AEROBIC ONLY Blood Culture adequate volume Performed at [facility], [address] [place], [place] [address]    Culture   Final    NO GROWTH 5 DAYS Performed at [facility], [place] [address], [place], [place] [address]    Report Status 05/20/2021 FINAL  Final  Resp Panel by RT-PCR (Flu A&B, Covid) Nasopharyngeal Swab     Status: None   Collection Time: 05/15/21 12:07 AM   Specimen: Nasopharyngeal Swab; Nasopharyngeal(NP) swabs in vial transport medium  Result Value Ref [place] Status   SARS Coronavirus 2 by RT PCR NEGATIVE NEGATIVE Final    Comment: (NOTE) SARS-CoV-2 target nucleic acids are NOT DETECTED.  The SARS-CoV-2 RNA is generally detectable in upper respiratory specimens during the acute phase of infection. The lowest concentration of SARS-CoV-2 viral copies this assay can detect is 138 copies/mL. A negative result does not preclude SARS-Cov-2 infection and should not be used as the sole basis for treatment or other patient management decisions. A negative result may occur with  improper specimen collection/handling, submission of specimen other than nasopharyngeal swab, presence of viral mutation(s) within the areas targeted by this assay, and inadequate number of viral copies(<138 copies/mL). A negative result must be combined with clinical observations, patient history, and epidemiological information. The expected result is Negative.  Fact Sheet for Patients:  EntrepreneurPulse.com.au  Fact Sheet for Healthcare Providers:  IncredibleEmployment.be  This test is no t yet approved or cleared by the [place] FDA and  has been authorized for detection and/or  diagnosis of SARS-CoV-2 by FDA under an Emergency Use Authorization (EUA). This EUA will remain  in effect (meaning this test can be used) for the duration of the COVID-19 declaration under Section 564(b)(1) of the Act, 21 U.S.C.section 360bbb-3(b)(1), unless the authorization is terminated  or revoked sooner.       Influenza A by PCR NEGATIVE NEGATIVE Final   Influenza B by PCR NEGATIVE NEGATIVE Final    Comment: (NOTE) The Xpert Xpress SARS-CoV-2/FLU/RSV plus assay is intended as an aid in the diagnosis of influenza from Nasopharyngeal swab specimens and should not be used as a sole basis for treatment. Nasal washings and aspirates are unacceptable for Xpert Xpress SARS-CoV-2/FLU/RSV testing.  Fact Sheet for Patients: EntrepreneurPulse.com.au  Fact Sheet for Healthcare Providers: IncredibleEmployment.be  This test is not yet approved or cleared by the [place] FDA and has been authorized for detection and/or diagnosis of SARS-CoV-2 by FDA under an Emergency Use Authorization (EUA). This EUA will remain in effect (meaning this test can be used) for the duration of the COVID-19 declaration under Section 564(b)(1) of the Act, 21 U.S.C. section 360bbb-3(b)(1), unless the authorization is terminated or revoked.  Performed at [facility], [address] [place], [place] [address]   Aerobic/Anaerobic Culture w Gram Stain (surgical/deep wound)     Status: None (Preliminary result)   Collection Time: 05/18/21  7:35 AM   Specimen: Foot; Abscess  Result Value Ref [place] Status   Specimen Description   Final    FOOT RIGHT Performed at [place] [address], [place], [place] [address]    Special Requests   Final    Normal Performed at [facility], [place] [address], [place], [place] [address]    Gram Stain   Final    FEW WBC PRESENT,BOTH PMN AND MONONUCLEAR RARE GRAM POSITIVE  COCCI Performed at [facility], [place] [address], [place], [place] [address]    Culture   Final    MODERATE STAPHYLOCOCCUS AUREUS NO ANAEROBES ISOLATED; CULTURE IN PROGRESS FOR 5 DAYS    Report Status PENDING  Incomplete   Organism ID, Bacteria STAPHYLOCOCCUS AUREUS  Final      Susceptibility   Staphylococcus aureus - MIC*    CIPROFLOXACIN <=0.5 SENSITIVE Sensitive     ERYTHROMYCIN <=0.25 SENSITIVE Sensitive     GENTAMICIN <=0.5 SENSITIVE Sensitive     OXACILLIN 0.5 SENSITIVE Sensitive     TETRACYCLINE <=1 SENSITIVE Sensitive     VANCOMYCIN 1 SENSITIVE Sensitive     TRIMETH/SULFA <=10 SENSITIVE Sensitive     CLINDAMYCIN <=0.25 SENSITIVE Sensitive  RIFAMPIN <=0.5 SENSITIVE Sensitive     Inducible Clindamycin NEGATIVE Sensitive     * MODERATE STAPHYLOCOCCUS AUREUS         Radiology Studies: No results found.      Scheduled Meds:  aspirin EC  81 mg Oral Daily   cefadroxil  500 mg Oral BID   enoxaparin (LOVENOX) injection  40 mg Subcutaneous Q24H   famotidine  20 mg Oral BID   insulin aspart  0-15 Units Subcutaneous TID WC   insulin aspart  5 Units Subcutaneous TID WC   insulin glargine-yfgn  20 Units Subcutaneous Daily   metroNIDAZOLE  500 mg Oral Q12H   Continuous Infusions:     LOS: 5 days     Cordelia Poche, MD [facility] 05/20/2021, 3:23 PM  If 7PM-7AM, please contact night-coverage [email or web address]

## 2021-05-20 NOTE — Plan of Care (Signed)
°  Problem: Coping: °Goal: Level of anxiety will decrease °Outcome: Progressing °  °

## 2021-05-20 NOTE — Progress Notes (Signed)
Patient noted to have a 3 cm (L) x .4 cm (W) open to the posterior base of left great toe. Assessed and cleaned with normal saline. Wrapped with petroleum gauze, 4x4. Will make request for WOC to assess for further assessment.

## 2021-05-21 ENCOUNTER — Inpatient Hospital Stay (HOSPITAL_COMMUNITY): Payer: Self-pay

## 2021-05-21 LAB — GLUCOSE, CAPILLARY
Glucose-Capillary: 107 mg/dL — ABNORMAL HIGH (ref 70–99)
Glucose-Capillary: 166 mg/dL — ABNORMAL HIGH (ref 70–99)
Glucose-Capillary: 92 mg/dL (ref 70–99)

## 2021-05-21 MED ORDER — TRESIBA FLEXTOUCH 200 UNIT/ML ~~LOC~~ SOPN: 20.0000 [IU] | PEN_INJECTOR | Freq: Every day | SUBCUTANEOUS | Status: DC

## 2021-05-21 MED ORDER — METRONIDAZOLE 500 MG PO TABS: 500.0000 mg | ORAL_TABLET | Freq: Two times a day (BID) | ORAL | 0 refills | Status: AC

## 2021-05-21 MED ORDER — CEFADROXIL 500 MG PO CAPS: 500.0000 mg | ORAL_CAPSULE | Freq: Two times a day (BID) | ORAL | 0 refills | Status: AC

## 2021-05-21 MED ORDER — GADOBUTROL 1 MMOL/ML IV SOLN
7.0000 mL | Freq: Once | INTRAVENOUS | Status: AC | PRN
  Administered 2021-05-21: 7 mL via INTRAVENOUS

## 2021-05-21 NOTE — Discharge Instructions (Signed)
Abigail Moran,  You were in the hospital with a foot infection. This was treated with antibiotics and opening up the wound. You have improved significantly. Your left leg does not have any skin or bone infections. Please continue to take good care of your feet. Please visually inspect your feet daily for cuts or bruises. Please follow-up with your primary care physician.

## 2021-05-21 NOTE — Progress Notes (Signed)
Patient given discharge, follow up, and medication instructions, verbalized understanding, IV removed, personal belongings with patient, friend to transport home

## 2021-05-21 NOTE — Discharge Summary (Signed)
Physician Discharge Summary  Abigail Moran EKC:[id number] DOB: 1976/05/24 DOA: 05/14/2021  PCP: [name], NP  Admit date: 05/14/2021 Discharge date: 05/21/2021  Admitted From: Home Disposition: Home  Recommendations for Outpatient Follow-up:  Follow up with PCP in 1 week Please obtain BMP/CBC in one week Please follow up on the following pending results: None  Home Health: None Equipment/Devices: None  Discharge Condition: Stable CODE STATUS: Full code Diet recommendation: Carb modified   Brief/Interim Summary:  Admission HPI written by Tennis Must, MD   HPI: Abigail Moran is a 45 y.o. female with medical history significant of seasonal allergies, erythropoietin deficiency anemia, right foot cellulitis and abscess, right lower extremity DVT, urolithiasis, menorrhagia with irregular cycle, stage 3a CKD, vitamin D deficiency who went to the emergency department due to left foot pain after sustaining an injury several days ago on her right fifth toe.  She has not been taking her insulin at home.  She has been having chills, but no fevers at home.  She was febrile on arrival to the emergency department.  She has had mild nausea and mild abdominal pain.  No emesis, diarrhea, constipation, melena or hematochezia.  No flank pain, dysuria, frequency or hematuria.  She denied chest pain, palpitations, diaphoresis, PND, orthopnea, polyuria, polydipsia, polyphagia or blurred vision.   Hospital course:  Cellulitis Right foot abscess Patient empirically started on Zosyn on admission. Development of lateral right foot mass consistent with abscess. Orthopedic surgery consulted on 11/9 and performed I&D. Wound cultures significant for staph aureus, MSSA. MRI obtained on 11/9 and significant for no deep infection or osteomyelitis. Transitioned to cefadroxil. Discharge to complete 7 days post-incision/drainage.   Left foot wound Does not appear actively infected. Painful. MRI  without evidence of osteomyelitis or underlying infection.   Diabetes mellitus, type 1 Uncontrolled with hyperglycemia. Hemoglobin A1C of 12.6% from 03/2021. Continue outpatient regimen/   Trichomoniasis Flagyl x7 days.   Hyponatremia Mildly decreased. Asymptomatic. In setting of hyperglycemia. Improved with IV fluids and correction of blood sugar.   Hypocalcemia Mild. Asymptomatic. Corrected calcium for albumin of 9.2 on 11/9. Stable.   CAD Continue aspirin   Gastroparesis Asymptomatic at this time. Continue Zofran prn   Anemia secondary to erythropoietin deficiency Patient follows with nephrology as an outpatient. Hemoglobin drifted down but is stable.   Hyperlipidemia Patient is prescribed Lipitor for which she is not taking as an outpatient.   Discharge Diagnoses:  Principal Problem:   Cellulitis of right lower extremity Active Problems:   Uncontrolled type 1 diabetes mellitus with hyperglycemia (HCC)   CAD (coronary artery disease)   Gastroparesis   Erythropoietin deficiency anemia   Hyperlipidemia   Hyponatremia   Hypocalcemia   Sepsis due to cellulitis (HCC)   Trichomoniasis, unspecified   Cutaneous abscess of right foot    Discharge Instructions   Allergies as of 05/21/2021       Reactions   Latex Itching   Tape Hives        Medication List     STOP taking these medications    famotidine 20 MG tablet Commonly known as: PEPCID       TAKE these medications    aspirin EC 81 MG tablet Take 81 mg by mouth daily.   atorvastatin 10 MG tablet Commonly known as: LIPITOR Take 1 tablet (10 mg total) by mouth daily.   blood glucose meter kit and supplies Kit Dispense based on patient and insurance preference. Use up to four times daily as directed.  E11.9   cefadroxil 500 MG capsule Commonly known as: DURICEF Take 1 capsule (500 mg total) by mouth 2 (two) times daily for 4 days.   Fiasp FlexTouch 100 UNIT/ML FlexTouch Pen Generic drug:  insulin aspart Inject 6-10 Units into the skin 3 (three) times daily between meals. What changed: how much to take   freestyle lancets Use 4x a day   FREESTYLE LITE test strip Generic drug: glucose blood USE FOUR TIMES DAILY   Insulin Pen Needle 32G X 4 MM Misc Use 4x a day   metroNIDAZOLE 500 MG tablet Commonly known as: FLAGYL Take 1 tablet (500 mg total) by mouth every 12 (twelve) hours for 2 days.   multivitamin tablet Take 1 tablet by mouth daily.   Tyler Aas FlexTouch 200 UNIT/ML FlexTouch Pen Generic drug: insulin degludec Inject 20 Units into the skin daily.        Allergies  Allergen Reactions   Latex Itching   Tape Hives    Consultations: Orthopedic surgery   Procedures/Studies: DG Tibia/Fibula Right  Result Date: 05/15/2021 CLINICAL DATA:  Right lower extremity pain.  Sepsis. EXAM: RIGHT TIBIA AND FIBULA - 2 VIEW; RIGHT FOOT - 2 VIEW COMPARISON:  None. FINDINGS: There is no acute fracture or dislocation. The bones are well mineralized. No arthritic changes. There is diffuse soft tissue swelling of the dorsum of the foot and diffuse subcutaneous edema of the leg suspicious for cellulitis. Clinical correlation is recommended. No radiopaque foreign object or soft tissue gas. IMPRESSION: 1. No acute osseous pathology. 2. Findings suspicious for cellulitis. Electronically Signed   By: Anner Crete M.D.   On: 05/15/2021 01:31   MR FOOT RIGHT W WO CONTRAST  Result Date: 05/17/2021 CLINICAL DATA:  Foot swelling, diabetic, osteomyelitis suspected. Dorsal foot pain and swelling. EXAM: MRI OF THE RIGHT FOREFOOT WITHOUT AND WITH CONTRAST TECHNIQUE: Multiplanar, multisequence MR imaging of the right forefoot was performed before and after the administration of intravenous contrast. CONTRAST:  20m GADAVIST GADOBUTROL 1 MMOL/ML IV SOLN COMPARISON:  Radiographs 05/15/2021 and 03/17/2020. FINDINGS: Bones/Joint/Cartilage No evidence of acute fracture, dislocation or cortical  destruction. There is incomplete fat saturation within the small toe, but no definite abnormal marrow enhancement. No significant joint effusions or abnormal synovial enhancement. The alignment is normal at the Lisfranc joint. Ligaments The collateral ligaments of the metatarsophalangeal joints appear intact. The Lisfranc ligament is intact. Muscles and Tendons Mild generalized muscular T2 hyperintensity attributed to diabetes. No suspicious enhancement or tenosynovitis. Soft tissues There is a large superficial area of skin blistering over the dorsal aspect of the 5th metatarsophalangeal joint, measuring approximately 3.1 x 1.0 x 3.5 cm. There are inflammatory changes in the underlying subcutaneous fat with heterogeneous enhancement and small fluid collections dorsal to the 5th metatarsophalangeal joint. No other focal fluid collections are identified. There is diffuse subcutaneous edema and enhancement throughout the dorsum of the foot. IMPRESSION: 1. Large focal area of skin blistering over the dorsal aspect of the 5th metatarsophalangeal joint with underlying inflammatory changes and ill-defined fluid in the subcutaneous fat. 2. No definite evidence of septic joint or osteomyelitis. 3. Generalized dorsal subcutaneous edema and enhancement suspicious for cellulitis. Electronically Signed   By: WRichardean SaleM.D.   On: 05/17/2021 20:30   MR FOOT LEFT W WO CONTRAST  Result Date: 05/21/2021 CLINICAL DATA:  Osteomyelitis. EXAM: MRI OF THE LEFT FOREFOOT WITHOUT AND WITH CONTRAST TECHNIQUE: Multiplanar, multisequence MR imaging of the left forefoot was performed both before and after administration of intravenous  contrast. CONTRAST:  32m GADAVIST GADOBUTROL 1 MMOL/ML IV SOLN COMPARISON:  None. FINDINGS: Bones/Joint/Cartilage No marrow signal abnormality. No fracture or dislocation. Mild first MTP joint osteoarthritis. No joint effusion. Ligaments Collateral ligaments are intact.  Lisfranc ligament is intact.  Muscles and Tendons Flexor and extensor tendons are intact. Increased T2 signal within in atrophy of the intrinsic muscles of the forefoot, nonspecific, but likely related to diabetic muscle changes. Soft tissue Mild dorsal foot soft tissue swelling without enhancement. No fluid collection or hematoma. No soft tissue mass. IMPRESSION: 1. No evidence of osteomyelitis or abscess. Electronically Signed   By: WTitus DubinM.D.   On: 05/21/2021 16:39   DG Chest Port 1 View  Result Date: 05/15/2021 CLINICAL DATA:  Leg pain.  Sepsis. EXAM: PORTABLE CHEST 1 VIEW COMPARISON:  None. FINDINGS: The cardiomediastinal contours are normal. The lungs are clear. Pulmonary vasculature is normal. No consolidation, pleural effusion, or pneumothorax. No acute osseous abnormalities are seen. IMPRESSION: Negative portable AP view of the chest. Electronically Signed   By: MKeith RakeM.D.   On: 05/15/2021 01:30   DG Foot 2 Views Right  Result Date: 05/15/2021 CLINICAL DATA:  Right lower extremity pain.  Sepsis. EXAM: RIGHT TIBIA AND FIBULA - 2 VIEW; RIGHT FOOT - 2 VIEW COMPARISON:  None. FINDINGS: There is no acute fracture or dislocation. The bones are well mineralized. No arthritic changes. There is diffuse soft tissue swelling of the dorsum of the foot and diffuse subcutaneous edema of the leg suspicious for cellulitis. Clinical correlation is recommended. No radiopaque foreign object or soft tissue gas. IMPRESSION: 1. No acute osseous pathology. 2. Findings suspicious for cellulitis. Electronically Signed   By: AAnner CreteM.D.   On: 05/15/2021 01:31   VAS [place]OWER EXTREMITY VENOUS (DVT)  Result Date: 05/16/2021  Lower Venous DVT Study Patient Name:  TEMELEE RODOCKER Date of Exam:   05/15/2021 Medical Rec #: [id number]      Accession #:    [id number] of Birth: [birth year]       Patient Gender: F Patient Age:   89years [facility]      VAS [place]OWER EXTREMITY VENOUS (DVT)  Referring Phys: DAVID ORTIZ --------------------------------------------------------------------------------  Indications: Swelling.  Risk Factors: None identified. Comparison Study: No prior studies. Performing Technologist: GOliver HumRVT  Examination Guidelines: A complete evaluation includes B-mode imaging, spectral Doppler, color Doppler, and power Doppler as needed of all accessible portions of each vessel. Bilateral testing is considered an integral part of a complete examination. Limited examinations for reoccurring indications may be performed as noted. The reflux portion of the exam is performed with the patient in reverse Trendelenburg.  +---------+---------------+---------+-----------+----------+--------------+ RIGHT    CompressibilityPhasicitySpontaneityPropertiesThrombus Aging +---------+---------------+---------+-----------+----------+--------------+ CFV      Full           Yes      Yes                                 +---------+---------------+---------+-----------+----------+--------------+ SFJ      Full                                                        +---------+---------------+---------+-----------+----------+--------------+ FV Prox  Full                                                        +---------+---------------+---------+-----------+----------+--------------+  FV Mid   Full                                                        +---------+---------------+---------+-----------+----------+--------------+ FV DistalFull                                                        +---------+---------------+---------+-----------+----------+--------------+ PFV      Full                                                        +---------+---------------+---------+-----------+----------+--------------+ POP      Full           Yes      Yes                                  +---------+---------------+---------+-----------+----------+--------------+ PTV      Full                                                        +---------+---------------+---------+-----------+----------+--------------+ PERO     Full                                                        +---------+---------------+---------+-----------+----------+--------------+   +----+---------------+---------+-----------+----------+--------------+ LEFTCompressibilityPhasicitySpontaneityPropertiesThrombus Aging +----+---------------+---------+-----------+----------+--------------+ CFV Full           Yes      Yes                                 +----+---------------+---------+-----------+----------+--------------+     Summary: RIGHT: - There is no evidence of deep vein thrombosis in the lower extremity.  - No cystic structure found in the popliteal fossa.  LEFT: - No evidence of common femoral vein obstruction.  *See table(s) above for measurements and observations. Electronically signed by Deitra Mayo MD on 05/16/2021 at 7:36:54 AM.    Final       Subjective: No issues this morning. Foot feeling better daily. Leg swelling improving.  Discharge Exam: Vitals:   05/21/21 0448 05/21/21 1224  BP: 132/90 114/83  Pulse: 93 95  Resp: 16 18  Temp: 98.4 F (36.9 C) 98 F (36.7 C)  SpO2: 100% 100%   Vitals:   05/20/21 1311 05/20/21 1949 05/21/21 0448 05/21/21 1224  BP: 121/89 (!) 125/91 132/90 114/83  Pulse: (!) 106 100 93 95  Resp: _0 Temp: (!) 97.5 F (36.4 C) 98.4 F (36.9 C) 98.4 F (36.9 C) 98 F (36.7 C)  TempSrc: Oral Oral Oral Oral  SpO2: 100%  100% 100% 100%  Weight:      Height:        General: Pt is alert, awake, not in acute distress Cardiovascular: RRR, S1/S2 +, no rubs, no gallops Respiratory: CTA bilaterally, no wheezing, no rhonchi Abdominal: Soft, NT, ND, bowel sounds + Extremities: RLE edema with mild warmth and minimal tenderness    The  results of significant diagnostics from this hospitalization (including imaging, microbiology, ancillary and laboratory) are listed below for reference.     Microbiology: Recent Results (from the past 240 hour(s))  Culture, blood (routine x 2)     Status: None   Collection Time: 05/14/21 10:55 PM   Specimen: BLOOD  Result Value Ref Range Status   Specimen Description   Final    BLOOD LEFT ANTECUBITAL Performed at [facility], [address] [place], [place] [address]    Special Requests   Final    BOTTLES DRAWN AEROBIC AND ANAEROBIC Blood Culture results may not be optimal due to an inadequate volume of blood received in culture bottles Performed at [facility], [address] [place], [place] [address]    Culture   Final    NO GROWTH 5 DAYS Performed at [facility], [place] [address], [place], [place] [address]    Report Status 05/20/2021 FINAL  Final  Culture, blood (routine x 2)     Status: None   Collection Time: 05/14/21 10:55 PM   Specimen: BLOOD  Result Value Ref Range Status   Specimen Description   Final    BLOOD SITE NOT SPECIFIED Performed at [facility], [address] [place], [place] [address]    Special Requests   Final    BOTTLES DRAWN AEROBIC ONLY Blood Culture adequate volume Performed at [facility], [address] [place], [place] [address]    Culture   Final    NO GROWTH 5 DAYS Performed at [facility], [place] [address] [place], [place] [address]    Report Status 05/20/2021 FINAL  Final  Resp Panel by RT-PCR (Flu A&B, Covid) Nasopharyngeal Swab     Status: None   Collection Time: 05/15/21 12:07 AM   Specimen: Nasopharyngeal Swab; Nasopharyngeal(NP) swabs in vial transport medium  Result Value Ref Range Status   SARS Coronavirus 2 by RT PCR NEGATIVE NEGATIVE Final    Comment: (NOTE) SARS-CoV-2 target nucleic acids are NOT DETECTED.  The SARS-CoV-2 RNA is generally detectable in upper  respiratory specimens during the acute phase of infection. The lowest concentration of SARS-CoV-2 viral copies this assay can detect is 138 copies/mL. A negative result does not preclude SARS-Cov-2 infection and should not be used as the sole basis for treatment or other patient management decisions. A negative result may occur with  improper specimen collection/handling, submission of specimen other than nasopharyngeal swab, presence of viral mutation(s) within the areas targeted by this assay, and inadequate number of viral copies(<138 copies/mL). A negative result must be combined with clinical observations, patient history, and epidemiological information. The expected result is Negative.  Fact Sheet for Patients:  EntrepreneurPulse.com.au  Fact Sheet for Healthcare Providers:  IncredibleEmployment.be  This test is no t yet approved or cleared by the [place] FDA and  has been authorized for detection and/or diagnosis of SARS-CoV-2 by FDA under an Emergency Use Authorization (EUA). This EUA will remain  in effect (meaning this test can be used) for the duration of the COVID-19  declaration under Section 564(b)(1) of the Act, 21 U.S.C.section 360bbb-3(b)(1), unless the authorization is terminated  or revoked sooner.       Influenza A by PCR NEGATIVE NEGATIVE Final   Influenza B by PCR NEGATIVE NEGATIVE Final    Comment: (NOTE) The Xpert Xpress SARS-CoV-2/FLU/RSV plus assay is intended as an aid in the diagnosis of influenza from Nasopharyngeal swab specimens and should not be used as a sole basis for treatment. Nasal washings and aspirates are unacceptable for Xpert Xpress SARS-CoV-2/FLU/RSV testing.  Fact Sheet for Patients: EntrepreneurPulse.com.au  Fact Sheet for Healthcare Providers: IncredibleEmployment.be  This test is not yet approved or cleared by the [place] FDA and has been  authorized for detection and/or diagnosis of SARS-CoV-2 by FDA under an Emergency Use Authorization (EUA). This EUA will remain in effect (meaning this test can be used) for the duration of the COVID-19 declaration under Section 564(b)(1) of the Act, 21 U.S.C. section 360bbb-3(b)(1), unless the authorization is terminated or revoked.  Performed at [facility], [address] [place], [place] [address]   Aerobic/Anaerobic Culture w Gram Stain (surgical/deep wound)     Status: None (Preliminary result)   Collection Time: 05/18/21  7:35 AM   Specimen: Foot; Abscess  Result Value Ref Range Status   Specimen Description   Final    FOOT RIGHT Performed at [facility] [address], [place], [place] [address]    Special Requests   Final    Normal Performed at [facility], [place] [address], [place], [place] [address]    Gram Stain   Final    FEW WBC PRESENT,BOTH PMN AND MONONUCLEAR RARE GRAM POSITIVE COCCI Performed at [facility], [place] [address], [place], [place] [address]    Culture   Final    MODERATE STAPHYLOCOCCUS AUREUS NO ANAEROBES ISOLATED; CULTURE IN PROGRESS FOR 5 DAYS    Report Status PENDING  Incomplete   Organism ID, Bacteria STAPHYLOCOCCUS AUREUS  Final      Susceptibility   Staphylococcus aureus - MIC*    CIPROFLOXACIN <=0.5 SENSITIVE Sensitive     ERYTHROMYCIN <=0.25 SENSITIVE Sensitive     GENTAMICIN <=0.5 SENSITIVE Sensitive     OXACILLIN 0.5 SENSITIVE Sensitive     TETRACYCLINE <=1 SENSITIVE Sensitive     VANCOMYCIN 1 SENSITIVE Sensitive     TRIMETH/SULFA <=10 SENSITIVE Sensitive     CLINDAMYCIN <=0.25 SENSITIVE Sensitive     RIFAMPIN <=0.5 SENSITIVE Sensitive     Inducible Clindamycin NEGATIVE Sensitive     * MODERATE STAPHYLOCOCCUS AUREUS     Labs:  Basic Metabolic Panel: Recent Labs  Lab 05/14/21 2254 05/15/21 0419 05/17/21 0438  NA 127* 130* 131*  K 4.9 4.1 4.2  CL 93* 98 103  CO2 _0 GLUCOSE 505* 384* 301*  BUN 21* 16 21*  CREATININE 1.25* 1.07* 1.00  CALCIUM 9.0 8.4* 8.0*   Liver Function Tests: Recent Labs  Lab 05/14/21 2254 05/17/21 0438  AST 23 22  ALT 13 15  ALKPHOS 85 73  BILITOT 1.3* 0.4  PROT 7.8 5.9*  ALBUMIN 3.5 2.5*   CBC: Recent Labs  Lab 05/14/21 2254 05/15/21 0419 05/17/21 0438 05/18/21 0433  WBC 9.9 9.6 4.3 4.9  NEUTROABS 8.4* 7.5  --   --   HGB 9.9* 8.6* 7.8* 7.9*  HCT 30.5* 26.5* 25.1* 25.1*  MCV 95.0 95.0 98.0 96.2  PLT 202 187 164 191   CBG: Recent Labs  Lab 05/20/21 1721 05/20/21 2135 05/21/21 0819 05/21/21 1221 05/21/21 1623  GLUCAP 179* 167* 107* 166* 92   Urinalysis    Component Value Date/Time   COLORURINE YELLOW 05/14/2021 2254   APPEARANCEUR HAZY (A) 05/14/2021 2254   LABSPEC 1.010 05/14/2021 2254   PHURINE 6.5 05/14/2021 2254   GLUCOSEU >=500 (A) 05/14/2021 2254   GLUCOSEU >=1000 (A) 08/08/2016 0813   HGBUR TRACE (A) 05/14/2021 2254   BILIRUBINUR NEGATIVE 05/14/2021 2254   KETONESUR 15 (A) 05/14/2021 2254   PROTEINUR NEGATIVE 05/14/2021 2254   UROBILINOGEN 0.2 08/08/2016 0813   NITRITE NEGATIVE 05/14/2021 2254   LEUKOCYTESUR NEGATIVE 05/14/2021 2254   Sepsis Labs Invalid input(s): PROCALCITONIN,  WBC,  LACTICIDVEN Microbiology Recent Results (from the past 240 hour(s))  Culture, blood (routine x 2)     Status: None   Collection Time: 05/14/21 10:55 PM   Specimen: BLOOD  Result Value Ref Range Status   Specimen Description   Final    BLOOD LEFT ANTECUBITAL Performed at [facility], [address] [place], [place] [address]    Special Requests   Final    BOTTLES DRAWN AEROBIC AND ANAEROBIC Blood Culture results may not be optimal due to an inadequate volume of blood received in culture bottles Performed at [facility], [address] [place], [place] [address]    Culture   Final    NO GROWTH 5 DAYS Performed at [facility], [place] [address],  [place], [place] [address]    Report Status 05/20/2021 FINAL  Final  Culture, blood (routine x 2)     Status: None   Collection Time: 05/14/21 10:55 PM   Specimen: BLOOD  Result Value Ref Range Status   Specimen Description   Final    BLOOD SITE NOT SPECIFIED Performed at [facility], [address] [place], [place] [address]    Special Requests   Final    BOTTLES DRAWN AEROBIC ONLY Blood Culture adequate volume Performed at [facility], [address] [place], [place] [address]    Culture   Final    NO GROWTH 5 DAYS Performed at [facility], [place] [address] [place], [place] [address]    Report Status 05/20/2021 FINAL  Final  Resp Panel by RT-PCR (Flu A&B, Covid) Nasopharyngeal Swab     Status: None   Collection Time: 05/15/21 12:07 AM   Specimen: Nasopharyngeal Swab; Nasopharyngeal(NP) swabs in vial transport medium  Result Value Ref Range Status   SARS Coronavirus 2 by RT PCR NEGATIVE NEGATIVE Final    Comment: (NOTE) SARS-CoV-2 target nucleic acids are NOT DETECTED.  The SARS-CoV-2 RNA is generally detectable in upper respiratory specimens during the acute phase of infection. The lowest concentration of SARS-CoV-2 viral copies this assay can detect is 138 copies/mL. A negative result does not preclude SARS-Cov-2 infection and should not be used as the sole basis for treatment or other patient management decisions. A negative result may occur with  improper specimen collection/handling, submission of specimen other than nasopharyngeal swab, presence of viral mutation(s) within the areas targeted by this assay, and inadequate number of viral copies(<138 copies/mL). A negative result must be combined with clinical observations, patient history, and epidemiological information. The expected result is Negative.  Fact Sheet for Patients:  EntrepreneurPulse.com.au  Fact Sheet for Healthcare Providers:   IncredibleEmployment.be  This test is no t yet approved or cleared by the [place] and  has been authorized for detection and/or diagnosis of SARS-CoV-2 by FDA under an Emergency Use Authorization (EUA). This EUA will remain  in effect (meaning this test can be used) for the duration of the COVID-19 declaration under Section 564(b)(1) of the Act, 21 U.S.C.section 360bbb-3(b)(1), unless the authorization is terminated  or revoked sooner.       Influenza A by PCR NEGATIVE NEGATIVE Final   Influenza B by PCR NEGATIVE NEGATIVE Final    Comment: (NOTE) The Xpert Xpress SARS-CoV-2/FLU/RSV plus assay is intended as an aid in the diagnosis of influenza from Nasopharyngeal swab specimens and should not be used as a sole basis for treatment. Nasal washings and aspirates are unacceptable for Xpert Xpress SARS-CoV-2/FLU/RSV testing.  Fact Sheet for Patients: EntrepreneurPulse.com.au  Fact Sheet for Healthcare Providers: IncredibleEmployment.be  This test is not yet approved or cleared by the [place] FDA and has been authorized for detection and/or diagnosis of SARS-CoV-2 by FDA under an Emergency Use Authorization (EUA). This EUA will remain in effect (meaning this test can be used) for the duration of the COVID-19 declaration under Section 564(b)(1) of the Act, 21 U.S.C. section 360bbb-3(b)(1), unless the authorization is terminated or revoked.  Performed at [facility], [address] [place], [place] [address]   Aerobic/Anaerobic Culture w Gram Stain (surgical/deep wound)     Status: None (Preliminary result)   Collection Time: 05/18/21  7:35 AM   Specimen: Foot; Abscess  Result Value Ref Range Status   Specimen Description   Final    FOOT RIGHT Performed at [place] [address], [place], [place] [address]    Special Requests   Final    Normal Performed at [facility], [place] [address] [place], [place] [address]    Gram Stain   Final    FEW WBC PRESENT,BOTH PMN AND MONONUCLEAR RARE GRAM POSITIVE COCCI Performed at [facility], [place] [address] [place], [place] [address]    Culture   Final    MODERATE STAPHYLOCOCCUS AUREUS NO ANAEROBES ISOLATED; CULTURE IN PROGRESS FOR 5 DAYS    Report Status PENDING  Incomplete   Organism ID, Bacteria STAPHYLOCOCCUS AUREUS  Final      Susceptibility   Staphylococcus aureus - MIC*    CIPROFLOXACIN <=0.5 SENSITIVE Sensitive     ERYTHROMYCIN <=0.25 SENSITIVE Sensitive     GENTAMICIN <=0.5 SENSITIVE Sensitive     OXACILLIN 0.5 SENSITIVE Sensitive     TETRACYCLINE <=1 SENSITIVE Sensitive     VANCOMYCIN 1 SENSITIVE Sensitive     TRIMETH/SULFA <=10 SENSITIVE Sensitive     CLINDAMYCIN <=0.25 SENSITIVE Sensitive     RIFAMPIN <=0.5 SENSITIVE Sensitive     Inducible Clindamycin NEGATIVE Sensitive     * MODERATE STAPHYLOCOCCUS AUREUS     Time coordinating discharge: 35 minutes  SIGNED:   Cordelia Poche, MD [facility] 05/21/2021, 4:56 PM

## 2021-05-21 NOTE — Plan of Care (Signed)
  Problem: Education: Goal: Knowledge of General Education information will improve Description: Including pain rating scale, medication(s)/side effects and non-pharmacologic comfort measures Outcome: Adequate for Discharge   Problem: Health Behavior/Discharge Planning: Goal: Ability to manage health-related needs will improve Outcome: Adequate for Discharge   Problem: Clinical Measurements: Goal: Ability to maintain clinical measurements within normal limits will improve Outcome: Adequate for Discharge Goal: Will remain free from infection Outcome: Adequate for Discharge Goal: Diagnostic test results will improve Outcome: Adequate for Discharge Goal: Respiratory complications will improve Outcome: Adequate for Discharge Goal: Cardiovascular complication will be avoided Outcome: Adequate for Discharge   Problem: Activity: Goal: Risk for activity intolerance will decrease 05/21/2021 1656 by Epimenio Foot, RN Outcome: Adequate for Discharge 05/21/2021 1638 by Epimenio Foot, RN Outcome: Progressing   Problem: Nutrition: Goal: Adequate nutrition will be maintained Outcome: Adequate for Discharge   Problem: Coping: Goal: Level of anxiety will decrease Outcome: Adequate for Discharge   Problem: Elimination: Goal: Will not experience complications related to bowel motility Outcome: Adequate for Discharge Goal: Will not experience complications related to urinary retention Outcome: Adequate for Discharge   Problem: Pain Managment: Goal: General experience of comfort will improve Outcome: Adequate for Discharge   Problem: Safety: Goal: Ability to remain free from injury will improve Outcome: Adequate for Discharge   Problem: Skin Integrity: Goal: Risk for impaired skin integrity will decrease Outcome: Adequate for Discharge

## 2021-05-21 NOTE — Plan of Care (Signed)
  Problem: Activity: Goal: Risk for activity intolerance will decrease Outcome: Progressing   

## 2021-05-23 LAB — AEROBIC/ANAEROBIC CULTURE W GRAM STAIN (SURGICAL/DEEP WOUND): Special Requests: NORMAL

## 2021-05-26 ENCOUNTER — Other Ambulatory Visit: Payer: Self-pay

## 2021-05-26 ENCOUNTER — Ambulatory Visit (INDEPENDENT_AMBULATORY_CARE_PROVIDER_SITE_OTHER): Payer: Self-pay | Admitting: Family

## 2021-05-26 ENCOUNTER — Encounter: Payer: Self-pay | Admitting: Family

## 2021-05-26 VITALS — BP 148/103 | HR 102 | Temp 98.7°F | Resp 16 | Ht 65.0 in | Wt 176.0 lb

## 2021-05-26 DIAGNOSIS — E1065 Type 1 diabetes mellitus with hyperglycemia: Secondary | ICD-10-CM

## 2021-05-26 DIAGNOSIS — L03119 Cellulitis of unspecified part of limb: Secondary | ICD-10-CM

## 2021-05-26 DIAGNOSIS — L02619 Cutaneous abscess of unspecified foot: Secondary | ICD-10-CM

## 2021-05-26 DIAGNOSIS — L97519 Non-pressure chronic ulcer of other part of right foot with unspecified severity: Secondary | ICD-10-CM | POA: Insufficient documentation

## 2021-05-26 DIAGNOSIS — I10 Essential (primary) hypertension: Secondary | ICD-10-CM | POA: Insufficient documentation

## 2021-05-26 DIAGNOSIS — E1059 Type 1 diabetes mellitus with other circulatory complications: Secondary | ICD-10-CM

## 2021-05-26 DIAGNOSIS — E785 Hyperlipidemia, unspecified: Secondary | ICD-10-CM

## 2021-05-26 MED ORDER — LISINOPRIL 10 MG PO TABS: 10.0000 mg | ORAL_TABLET | Freq: Every day | ORAL | 5 refills | Status: DC

## 2021-05-26 NOTE — Assessment & Plan Note (Signed)
Reinforced importance of compliance with insulin and strict DM control.  For now she will continue NPH and Fiask Flex touch pen until she is able to obtain insurance and get back on Tresiba.

## 2021-05-26 NOTE — Assessment & Plan Note (Signed)
Abscess appeals to be healing well. Wound cleaned with betadine and then dressed with petrolatum dressing and guaze. She is advised to call if increased pain, swelling, redness, drainage or if the wound enlarges.

## 2021-05-26 NOTE — Patient Instructions (Signed)
Please start atorvastatin.

## 2021-05-26 NOTE — Assessment & Plan Note (Signed)
New.  BP elevated today. Will add lisinopril 10mg  once daily for renal protection/bp control.

## 2021-05-26 NOTE — Assessment & Plan Note (Signed)
She is not taking lipitor. Advised her to start for CV risk reduction. She will use a Good Rx coupon.

## 2021-05-26 NOTE — Progress Notes (Signed)
Subjective:     Patient ID: Abigail Moran, female    DOB: May 17, 1976, 45 y.o.   MRN: [id number]  Chief Complaint  Patient presents with   Wound Check    Here for wound check, right foot     HPI Patient is in today for hospital follow up.  The patient was admitted on May 14, 2021 until May 21, 2021.  She presented to the emergency department with left foot pain which developed after she injured her right fifth toe.  At that time she admitted to not taking her insulin at home.  She was treated empirically with Zosyn upon admission.  Orthopedic surgery was consulted on November 9 and they performed an I&D.  Wound cultures grew staph aureus MSSA.  She also underwent an MRI on 11 9 which did not show osteomyelitis.  She was transitioned to cefadroxil upon discharge.  She was also treated for trichomoniasis with Flagyl for 7 days.  She reports that her foot sore has not closed.  She reports that she can move on her foot. Denies drainage or odor.  She is placing a vaseline dressing.  She is using betadine on her foot.     DM2- she is currently uninsured and has not been able to afford her Tresiba 20 units daily. Instead, she has been using NPH 20 units bid which she states works the same for her as the 20 unit [place].  She plans to obtain insurance the first of the year and has a follow up scheduled with her endocrinologist.   Lab Results  Component Value Date   HGBA1C 12.6 (H) 03/31/2021   HGBA1C 10.7 (H) 08/31/2020   HGBA1C 9.9 (H) 03/18/2020   Lab Results  Component Value Date   MICROALBUR 13.1 (H) 03/31/2021   [place] 93 01/29/2020   CREATININE 1.00 05/17/2021     Health Maintenance Due  Topic Date Due   Pneumococcal Vaccine 67-50 Years old (1 - PCV) Never done   OPHTHALMOLOGY EXAM  12/08/2015   FOOT EXAM  12/21/2020    Past Medical History:  Diagnosis Date   Allergy    seasonal allergies   Anemia    on meds   Blood transfusion without reported diagnosis 2008    Cellulitis and abscess of foot 03/18/2020   right foot   Diabetes mellitus without complication ([place])    on meds   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   Heart attack ([place]) 05/04/15   History of DVT (deep vein thrombosis)    following hospitalization 2020   History of kidney stones    Menorrhagia with irregular cycle 10/11/2020   Renal disorder    Sepsis due to cellulitis ([place]) 05/15/2021   Vitamin D deficiency 09/14/2014    Past Surgical History:  Procedure Laterality Date   CARDIAC CATHETERIZATION  2016   CHOLECYSTECTOMY  2001   WISDOM TOOTH EXTRACTION      Family History  Problem Relation Age of Onset   Diabetes Father    Hypertension Father    Hyperlipidemia Father    Liver cancer Maternal Grandfather    Kidney disease Neg Hx    Heart disease Neg Hx    Colon polyps Neg Hx    Colon cancer Neg Hx    Stomach cancer Neg Hx    Rectal cancer Neg Hx     Social History   Socioeconomic History   Marital status: Single    Spouse name: Not on file   Number of children: Not on  file   Years of education: Not on file   Highest education level: Not on file  Occupational History   Not on file  Tobacco Use   Smoking status: Never   Smokeless tobacco: Never  Vaping Use   Vaping Use: Never used  Substance and Sexual Activity   Alcohol use: Not Currently    Comment: 3 x per month   Drug use: Not Currently    Frequency: 2.0 times per week    Types: Marijuana   Sexual activity: Not Currently  Other Topics Concern   Not on file  Social History Narrative   Works as Probation officer    Lives with common law husband   No children   Hair school   Grew up in [place]   Social Determinants of Health   Financial Resource Strain: Not on file  Food Insecurity: Not on file  Transportation Needs: Not on file  Physical Activity: Not on file  Stress: Not on file  Social Connections: Not on file  Intimate Partner Violence: Not on file    Outpatient Medications Prior to  Visit  Medication Sig Dispense Refill   aspirin EC 81 MG tablet Take 81 mg by mouth daily.     atorvastatin (LIPITOR) 10 MG tablet Take 1 tablet (10 mg total) by mouth daily. 90 tablet 1   blood glucose meter kit and supplies KIT Dispense based on patient and insurance preference. Use up to four times daily as directed. E11.9 1 each 0   glucose blood (FREESTYLE LITE) test strip USE FOUR TIMES DAILY 300 strip 3   insulin aspart (FIASP FLEXTOUCH) 100 UNIT/ML FlexTouch Pen Inject 6-10 Units into the skin 3 (three) times daily between meals. (Patient taking differently: Inject 4-10 Units into the skin 3 (three) times daily between meals.) 30 mL 3   insulin degludec (TRESIBA FLEXTOUCH) 200 UNIT/ML FlexTouch Pen Inject 20 Units into the skin daily.     Insulin Pen Needle 32G X 4 MM MISC Use 4x a day 300 each 3   Lancets (FREESTYLE) lancets Use 4x a day 300 each 11   Multiple Vitamin (MULTIVITAMIN) tablet Take 1 tablet by mouth daily.     No facility-administered medications prior to visit.    Allergies  Allergen Reactions   Latex Itching   Tape Hives    ROS     Objective:    Physical Exam Constitutional:      Appearance: She is well-developed.  Cardiovascular:     Rate and Rhythm: Normal rate.  Pulmonary:     Effort: Pulmonary effort is normal.  Skin:    General: Skin is warm and dry.     Comments: + open wound right lateral foot beneath 5th toe. No drainage, no odor, clean base  Psychiatric:        Behavior: Behavior normal.        Thought Content: Thought content normal.        Judgment: Judgment normal.    BP (!) 148/103 (BP Location: Right Arm, Patient Position: Sitting, Cuff Size: Small)   Pulse (!) 102   Temp 98.7 F (37.1 C) (Oral)   Resp 16   Ht _0  (1.651 m)   Wt 176 lb (79.8 kg)   SpO2 100%   BMI 29.29 kg/m  Wt Readings from Last 3 Encounters:  05/26/21 176 lb (79.8 kg)  05/14/21 160 lb 15 oz (73 kg)  05/09/21 161 lb (73 kg)       Assessment &  Plan:    Problem List Items Addressed This Visit       Unprioritized   Uncontrolled type 1 diabetes mellitus with hyperglycemia ([place])    Reinforced importance of compliance with insulin and strict DM control.  For now she will continue NPH and Fiask Flex touch pen until she is able to obtain insurance and get back on Tresiba.       Relevant Medications   lisinopril (ZESTRIL) 10 MG tablet   Primary hypertension    New.  BP elevated today. Will add lisinopril 17m once daily for renal protection/bp control.       Relevant Medications   lisinopril (ZESTRIL) 10 MG tablet   Hyperlipidemia    She is not taking lipitor. Advised her to start for CV risk reduction. She will use a Good Rx coupon.       Relevant Medications   lisinopril (ZESTRIL) 10 MG tablet   Cellulitis and abscess of foot - Primary    Abscess appeals to be healing well. Wound cleaned with betadine and then dressed with petrolatum dressing and guaze. She is advised to call if increased pain, swelling, redness, drainage or if the wound enlarges.       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 I am also having her maintain her aspirin EC, blood glucose meter kit and supplies, freestyle, Insulin Pen Needle, Fiasp FlexTouch, FREESTYLE LITE, atorvastatin, multivitamin, and Tresiba FlexTouch.  Meds ordered this encounter  Medications   lisinopril (ZESTRIL) 10 MG tablet    Sig: Take 1 tablet (10 mg total) by mouth daily.    Dispense:  30 tablet    Refill:  5    Order Specific Question:   Supervising Provider    Answer:   BPenni HomansA [[[id number]]

## 2021-05-27 LAB — COMPREHENSIVE METABOLIC PANEL
AG Ratio: 0.9 (calc) — ABNORMAL LOW (ref 1.0–2.5)
ALT: 18 U/L (ref 6–29)
AST: 27 U/L (ref 10–35)
Albumin: 3.5 g/dL — ABNORMAL LOW (ref 3.6–5.1)
Alkaline phosphatase (APISO): 112 U/L (ref 31–125)
BUN: 14 mg/dL (ref 7–25)
CO2: 22 mmol/L (ref 20–32)
Calcium: 9.5 mg/dL (ref 8.6–10.2)
Chloride: 103 mmol/L (ref 98–110)
Creat: 0.93 mg/dL (ref 0.50–0.99)
Globulin: 3.9 g/dL (calc) — ABNORMAL HIGH (ref 1.9–3.7)
Glucose, Bld: 343 mg/dL — ABNORMAL HIGH (ref 65–99)
Potassium: 4.7 mmol/L (ref 3.5–5.3)
Sodium: 135 mmol/L (ref 135–146)
Total Bilirubin: 0.4 mg/dL (ref 0.2–1.2)
Total Protein: 7.4 g/dL (ref 6.1–8.1)

## 2021-05-27 LAB — CBC WITH DIFFERENTIAL/PLATELET
Absolute Monocytes: 400 cells/uL (ref 200–950)
Basophils Absolute: 28 cells/uL (ref 0–200)
Basophils Relative: 0.6 %
Eosinophils Absolute: 80 cells/uL (ref 15–500)
Eosinophils Relative: 1.7 %
HCT: 29.9 % — ABNORMAL LOW (ref 35.0–45.0)
Hemoglobin: 9.3 g/dL — ABNORMAL LOW (ref 11.7–15.5)
Lymphs Abs: 1269 cells/uL (ref 850–3900)
MCH: 30.1 pg (ref 27.0–33.0)
MCHC: 31.1 g/dL — ABNORMAL LOW (ref 32.0–36.0)
MCV: 96.8 fL (ref 80.0–100.0)
MPV: 8.9 fL (ref 7.5–12.5)
Monocytes Relative: 8.5 %
Neutro Abs: 2923 cells/uL (ref 1500–7800)
Neutrophils Relative %: 62.2 %
Platelets: 453 10*3/uL — ABNORMAL HIGH (ref 140–400)
RBC: 3.09 10*6/uL — ABNORMAL LOW (ref 3.80–5.10)
RDW: 13.1 % (ref 11.0–15.0)
Total Lymphocyte: 27 %
WBC: 4.7 10*3/uL (ref 3.8–10.8)

## 2021-05-31 ENCOUNTER — Telehealth: Payer: Self-pay

## 2021-05-31 NOTE — Telephone Encounter (Signed)
Transition Care Management Unsuccessful Follow-up Telephone Call  Date of discharge and from where:  05/21/2021  Abigail Moran  Attempts:  1st Attempt  Reason for unsuccessful TCM follow-up call:  No answer/busy  Rowe Pavy, RN, BSN, CEN [facility] Coordinator [phone]

## 2021-06-05 ENCOUNTER — Telehealth: Payer: Self-pay

## 2021-06-05 NOTE — Telephone Encounter (Signed)
Transition Care Management Unsuccessful Follow-up Telephone Call  Date of discharge and from where:  05/21/21 from [facility]  Attempts:  2nd Attempt  Reason for unsuccessful TCM follow-up call:  Unable to leave message      Kathyrn Sheriff, RN, MSN, BSN, CCM [facility] Care Management Coordinator [phone]

## 2021-06-09 ENCOUNTER — Ambulatory Visit (INDEPENDENT_AMBULATORY_CARE_PROVIDER_SITE_OTHER): Payer: Self-pay | Admitting: Family

## 2021-06-09 ENCOUNTER — Encounter: Payer: Self-pay | Admitting: Family

## 2021-06-09 VITALS — BP 120/90 | HR 94 | Temp 98.1°F | Resp 18 | Ht 65.0 in | Wt 171.4 lb

## 2021-06-09 DIAGNOSIS — I10 Essential (primary) hypertension: Secondary | ICD-10-CM

## 2021-06-09 DIAGNOSIS — E785 Hyperlipidemia, unspecified: Secondary | ICD-10-CM

## 2021-06-09 DIAGNOSIS — E559 Vitamin D deficiency, unspecified: Secondary | ICD-10-CM

## 2021-06-09 DIAGNOSIS — L97519 Non-pressure chronic ulcer of other part of right foot with unspecified severity: Secondary | ICD-10-CM

## 2021-06-09 LAB — BASIC METABOLIC PANEL
BUN: 31 mg/dL — ABNORMAL HIGH (ref 6–23)
CO2: 20 mEq/L (ref 19–32)
Calcium: 9.1 mg/dL (ref 8.4–10.5)
Chloride: 103 mEq/L (ref 96–112)
Creatinine, Ser: 1.2 mg/dL (ref 0.40–1.20)
GFR: 54.23 mL/min — ABNORMAL LOW (ref >60.00)
Glucose, Bld: 251 mg/dL — ABNORMAL HIGH (ref 70–99)
Potassium: 4.3 mEq/L (ref 3.5–5.1)
Sodium: 132 mEq/L — ABNORMAL LOW (ref 135–145)

## 2021-06-09 LAB — VITAMIN D 25 HYDROXY (VIT D DEFICIENCY, FRACTURES): VITD: 15.84 ng/mL — ABNORMAL LOW (ref 30.00–100.00)

## 2021-06-09 MED ORDER — ATORVASTATIN CALCIUM 10 MG PO TABS: 10.0000 mg | ORAL_TABLET | Freq: Every day | ORAL | 1 refills | Status: DC

## 2021-06-09 NOTE — Assessment & Plan Note (Signed)
Improving. Wound was cleansed today with betadine and dressed with sterile petrolatum dressing/coban wrap. She is advised to continue wound care, send updated photos weekly via mychart until healed, call if wound worsens.

## 2021-06-09 NOTE — Assessment & Plan Note (Addendum)
BP is much better with addition of lisinopril 10mg . Continue. Obtain follow up bmet.

## 2021-06-09 NOTE — Patient Instructions (Addendum)
Please complete lab work prior to leaving.  Please continue wound care. Send me a photo of your wound once a week via mychart until heald.

## 2021-06-09 NOTE — Progress Notes (Addendum)
Subjective:   By signing my name below, I, Abigail Moran, attest that this documentation has been prepared under the direction and in the presence of [name], NP  06/09/2021   Patient ID: Abigail Moran, female    DOB: 28-Dec-1975, 45 y.o.   MRN: [id number]  Chief Complaint  Patient presents with   Cellulitis    Pt states swelling is better and the sore is looking better. No discharge    Follow-up    HPI Patient is in today for an office visit.  Foot Wound- She thinks the ulcer on her right toe is looking better and the swelling hs reduced. She has been using Vaseline and anti-bacterial ointment.  Hypertension- At her last visit, her blood pressure was high and she was started on lisinopril. Her blood pressure is stable at this visit an she is doing will on it. 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   Heart attack ([place]) 05/04/15   History of DVT (deep vein thrombosis)    following hospitalization 2020   History of kidney stones    Menorrhagia with irregular cycle 10/11/2020   Renal disorder    Sepsis due to cellulitis ([place]) 05/15/2021   Vitamin D deficiency 09/14/2014    Past Surgical History:  Procedure Laterality Date   CARDIAC CATHETERIZATION  2016   CHOLECYSTECTOMY  2001   WISDOM TOOTH EXTRACTION      Family History  Problem Relation Age of Onset   Diabetes Father    Hypertension Father    Hyperlipidemia Father    Liver cancer Maternal Grandfather    Kidney disease Neg Hx    Heart disease Neg Hx    Colon polyps Neg Hx    Colon cancer Neg Hx    Stomach cancer Neg Hx    Rectal cancer Neg Hx      Social History   Socioeconomic History   Marital status: Single    Spouse name: Not on file   Number of children: Not on file   Years of education: Not on file   Highest education level: Not on file  Occupational History   Not on file  Tobacco Use   Smoking status: Never   Smokeless tobacco: Never  Vaping Use   Vaping Use: Never used  Substance and Sexual Activity   Alcohol use: Not Currently    Comment: 3 x per month   Drug use: Not Currently    Frequency: 2.0 times per week    Types: Marijuana   Sexual activity: Not Currently  Other Topics Concern   Not on file  Social History Narrative   Works as Probation officer    Lives with common law husband   No children   Hair school   Grew up in [place]   Social Determinants of Health   Financial Resource Strain: Not on file  Food Insecurity: Not on file  Transportation Needs: Not on file  Physical Activity: Not on file  Stress: Not on file  Social Connections: Not on file  Intimate Partner Violence: Not on file  Outpatient Medications Prior to Visit  Medication Sig Dispense Refill   aspirin EC 81 MG tablet Take 81 mg by mouth daily.     blood glucose meter kit and supplies KIT Dispense based on patient and insurance preference. Use up to four times daily as directed. E11.9 1 each 0   glucose blood (FREESTYLE LITE) test strip USE FOUR TIMES DAILY 300 strip 3   insulin aspart (FIASP FLEXTOUCH) 100 UNIT/ML FlexTouch Pen Inject 6-10 Units into the skin 3 (three) times daily between meals. (Patient taking differently: Inject 4-10 Units into the skin 3 (three) times daily between meals.) 30 mL 3   insulin degludec (TRESIBA FLEXTOUCH) 200 UNIT/ML FlexTouch Pen Inject 20 Units into the skin daily.     Insulin Pen Needle 32G X 4 MM MISC Use 4x a day 300 each 3   Lancets (FREESTYLE) lancets Use 4x a day 300 each 11   lisinopril (ZESTRIL) 10 MG tablet Take 1 tablet (10 mg total) by mouth daily. 30 tablet 5   Multiple  Vitamin (MULTIVITAMIN) tablet Take 1 tablet by mouth daily.     atorvastatin (LIPITOR) 10 MG tablet Take 1 tablet (10 mg total) by mouth daily. 90 tablet 1   No facility-administered medications prior to visit.    Allergies  Allergen Reactions   Latex Itching   Tape Hives    Review of Systems  Constitutional:  Negative for fever.  HENT:  Negative for ear pain and hearing loss.        (-)nystagmus (-)adenopathy  Eyes:  Negative for blurred vision.  Respiratory:  Negative for cough, shortness of breath and wheezing.   Cardiovascular:  Negative for chest pain and leg swelling.  Gastrointestinal:  Negative for blood in stool, diarrhea, nausea and vomiting.  Genitourinary:  Negative for dysuria and frequency.  Musculoskeletal:  Negative for joint pain and myalgias.  Skin:  Negative for rash.  Neurological:  Negative for headaches.  Psychiatric/Behavioral:  Negative for depression. The patient is not nervous/anxious.       Objective:    Physical Exam Constitutional:      General: She is not in acute distress.    Appearance: Normal appearance. She is not ill-appearing.  HENT:     Head: Normocephalic and atraumatic.     Right Ear: External ear normal.     Left Ear: External ear normal.  Eyes:     Extraocular Movements: Extraocular movements intact.     Pupils: Pupils are equal, round, and reactive to light.  Cardiovascular:     Rate and Rhythm: Normal rate and regular rhythm.     Pulses: Normal pulses.     Heart sounds: Normal heart sounds. No murmur heard. Pulmonary:     Effort: Pulmonary effort is normal. No respiratory distress.     Breath sounds: Normal breath sounds. No wheezing or rhonchi.  Abdominal:     General: Bowel sounds are normal. There is no distension.     Palpations: Abdomen is soft.     Tenderness: There is no abdominal tenderness. There is no guarding or rebound.  Musculoskeletal:     Cervical back: Neck supple.  Feet:     Comments: approximally 1  inch long 1/2 inch wide wound noted on the lateral right foot   Skin:    General: Skin is warm and dry.  Neurological:     Mental Status: She is alert and oriented to person, place, and time.  Psychiatric:  Behavior: Behavior normal.        Judgment: Judgment normal.     BP 120/90 (BP Location: Left Arm, Patient Position: Sitting, Cuff Size: Normal)   Pulse 94   Temp 98.1 F (36.7 C) (Oral)   Resp 18   Ht _0  (1.651 m)   Wt 171 lb 6.4 oz (77.7 kg)   SpO2 100%   BMI 28.52 kg/m  Wt Readings from Last 3 Encounters:  06/09/21 171 lb 6.4 oz (77.7 kg)  05/26/21 176 lb (79.8 kg)  05/14/21 160 lb 15 oz (73 kg)    Assessment & Plan:   Problem List Items Addressed This Visit       Unprioritized   Vitamin D deficiency - Primary   Relevant Orders   VITAMIN D 25 Hydroxy (Vit-D Deficiency, Fractures)   Skin ulcer of right foot ([place]    Improving. Wound was cleansed today with betadine and dressed with sterile petrolatum dressing/coban wrap. She is advised to continue wound care, send updated photos weekly via mychart until healed, call if wound worsens.       Primary hypertension    BP is much better with addition of lisinopril 78m. Continue. Obtain follow up bmet.         Relevant Medications   atorvastatin (LIPITOR) 10 MG tablet   Other Relevant Orders   Basic metabolic panel   Hyperlipidemia    Not taking lipitor. Advised pt to begin to decrease CV risk.       Relevant Medications   atorvastatin (LIPITOR) 10 MG tablet    Meds ordered this encounter  Medications   atorvastatin (LIPITOR) 10 MG tablet    Sig: Take 1 tablet (10 mg total) by mouth daily.    Dispense:  90 tablet    Refill:  1    Order Specific Question:   Supervising Provider    Answer:   BPenni HomansA [[id number]]    I,Abigail Moran,acting as a scribe for MNance Pear NP.,have documented all relevant documentation on the behalf of MNance Pear NP,as directed by  MNance Pear NP while in the presence of MNance Pear NP.   I, ODebbrah Alar NP, personally preformed the services described in this documentation.  All medical record entries made by the scribe were at my direction and in my presence.  I have reviewed the chart and discharge instructions (if applicable) and agree that the record reflects my personal performance and is accurate and complete. 06/09/2021

## 2021-06-09 NOTE — Assessment & Plan Note (Signed)
Not taking lipitor. Advised pt to begin to decrease CV risk.

## 2021-06-12 ENCOUNTER — Telehealth: Payer: Self-pay | Admitting: Family

## 2021-06-12 DIAGNOSIS — E559 Vitamin D deficiency, unspecified: Secondary | ICD-10-CM

## 2021-06-12 MED ORDER — VITAMIN D (ERGOCALCIFEROL) 1.25 MG (50000 UNIT) PO CAPS: 50000.0000 [IU] | ORAL_CAPSULE | ORAL | 0 refills | Status: DC

## 2021-06-12 NOTE — Telephone Encounter (Signed)
Vitamin D level is low.  Advise patient to begin vit D 50000 units once weekly for 12 weeks, then repeat vit D level (dx Vit D deficiency).    Also, sugar remains elevated- please keep upcoming appointment with endo.

## 2021-06-12 NOTE — Telephone Encounter (Signed)
She will keep appointment with endo and follow up with [place] as scheduled

## 2021-06-12 NOTE — Telephone Encounter (Signed)
Patient advised of results and new prescription 

## 2021-06-15 ENCOUNTER — Telehealth: Payer: Self-pay | Admitting: Internal Medicine

## 2021-06-15 ENCOUNTER — Ambulatory Visit: Payer: Self-pay | Admitting: Internal Medicine

## 2021-06-15 ENCOUNTER — Encounter: Payer: Self-pay | Admitting: Internal Medicine

## 2021-06-15 NOTE — Telephone Encounter (Signed)
Patient dismissed from [facility] by Carlus Pavlov, MD, effective 06/15/21. Dismissal Letter sent out by 1st class mail. [employer]
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
## 2021-07-06 ENCOUNTER — Encounter: Payer: Self-pay | Admitting: Hematology & Oncology

## 2021-10-02 ENCOUNTER — Encounter: Payer: Self-pay | Admitting: Family

## 2021-12-06 ENCOUNTER — Encounter: Payer: Self-pay | Admitting: Family

## 2022-02-12 ENCOUNTER — Emergency Department (HOSPITAL_BASED_OUTPATIENT_CLINIC_OR_DEPARTMENT_OTHER): Payer: Self-pay

## 2022-02-12 ENCOUNTER — Encounter (HOSPITAL_BASED_OUTPATIENT_CLINIC_OR_DEPARTMENT_OTHER): Payer: Self-pay | Admitting: Emergency Medicine

## 2022-02-12 ENCOUNTER — Other Ambulatory Visit: Payer: Self-pay

## 2022-02-12 ENCOUNTER — Emergency Department (HOSPITAL_BASED_OUTPATIENT_CLINIC_OR_DEPARTMENT_OTHER)
Admission: EM | Admit: 2022-02-12 | Discharge: 2022-02-12 | Disposition: A | Discharge status: Home / Self Care | Payer: Self-pay | Attending: Emergency Medicine | Admitting: Emergency Medicine

## 2022-02-12 DIAGNOSIS — L0390 Cellulitis, unspecified: Secondary | ICD-10-CM

## 2022-02-12 DIAGNOSIS — Z7982 Long term (current) use of aspirin: Secondary | ICD-10-CM | POA: Insufficient documentation

## 2022-02-12 DIAGNOSIS — E119 Type 2 diabetes mellitus without complications: Secondary | ICD-10-CM | POA: Insufficient documentation

## 2022-02-12 DIAGNOSIS — L03116 Cellulitis of left lower limb: Secondary | ICD-10-CM | POA: Insufficient documentation

## 2022-02-12 DIAGNOSIS — Z91040 Latex allergy status: Secondary | ICD-10-CM | POA: Insufficient documentation

## 2022-02-12 DIAGNOSIS — Z794 Long term (current) use of insulin: Secondary | ICD-10-CM | POA: Insufficient documentation

## 2022-02-12 MED ORDER — DOXYCYCLINE HYCLATE 100 MG PO TABS
100.0000 mg | ORAL_TABLET | Freq: Once | ORAL | Status: DC
  Filled 2022-02-12: qty 1

## 2022-02-12 MED ORDER — DOXYCYCLINE HYCLATE 100 MG PO CAPS: 100.0000 mg | ORAL_CAPSULE | Freq: Two times a day (BID) | ORAL | 0 refills | Status: DC

## 2022-02-12 NOTE — Discharge Instructions (Addendum)
Follow-up with the podiatrist.  Take next dose antibiotic tomorrow morning.

## 2022-02-12 NOTE — ED Triage Notes (Signed)
Skin tear between 5th and 4th right toe, some swelling noted , possible infection

## 2022-02-12 NOTE — ED Provider Notes (Signed)
[place] EMERGENCY DEPARTMENT Provider Note   CSN: [id number] Arrival date & time: 02/12/22  1652     History  Chief Complaint  Patient presents with   Toe Pain    Abigail Moran is a 46 y.o. female.  Patient here with concern for right foot infection.  History of diabetes.  Nothing makes it worse or better.  Symptoms for the last several days.  Denies any fever, chills, numbness, weakness.  The history is provided by the patient.       Home Medications Prior to Admission medications   Medication Sig Start Date End Date Taking? Authorizing Provider  doxycycline (VIBRAMYCIN) 100 MG capsule Take 1 capsule (100 mg total) by mouth 2 (two) times daily. 02/12/22  Yes Curatolo, Adam, DO  aspirin EC 81 MG tablet Take 81 mg by mouth daily.    [provider]  atorvastatin (LIPITOR) 10 MG tablet Take 1 tablet (10 mg total) by mouth daily. 06/09/21   [name], NP  blood glucose meter kit and supplies KIT Dispense based on patient and insurance preference. Use up to four times daily as directed. E11.9 09/28/20   [name], NP  glucose blood (FREESTYLE LITE) test strip USE FOUR TIMES DAILY 10/14/20   Philemon Kingdom, MD  insulin aspart (FIASP FLEXTOUCH) 100 UNIT/ML FlexTouch Pen Inject 6-10 Units into the skin 3 (three) times daily between meals. Patient taking differently: Inject 4-10 Units into the skin 3 (three) times daily between meals. 10/14/20   Philemon Kingdom, MD  insulin degludec (TRESIBA FLEXTOUCH) 200 UNIT/ML FlexTouch Pen Inject 20 Units into the skin daily. 05/21/21   Mariel Aloe, MD  Insulin Pen Needle 32G X 4 MM MISC Use 4x a day 10/14/20   Philemon Kingdom, MD  Lancets (FREESTYLE) lancets Use 4x a day 09/28/20   [name], NP  lisinopril (ZESTRIL) 10 MG tablet Take 1 tablet (10 mg total) by mouth daily. 05/26/21   [name], NP  Multiple Vitamin (MULTIVITAMIN) tablet Take 1 tablet by mouth daily.    [provider]  Vitamin D, Ergocalciferol, (DRISDOL) 1.25 MG (50000 UNIT) CAPS capsule Take 1 capsule (50,000 Units total) by mouth every 7 (seven) days. 06/12/21   [name], NP      Allergies    Latex and Tape    Review of Systems   Review of Systems  Physical Exam Updated Vital Signs BP 114/84 (BP Location: Right Arm)   Pulse (!) 110   Temp 97.7 F (36.5 C) (Oral)   Resp 18   Ht 5' 5.5" (1.664 m)   Wt 90.7 kg   SpO2 98%   BMI 32.78 kg/m  Physical Exam HENT:     Head: Normocephalic.  Cardiovascular:     Pulses: Normal pulses.     Heart sounds: Normal heart sounds.  Musculoskeletal:        General: No swelling or tenderness. Normal range of motion.  Skin:    Capillary Refill: Capillary refill takes less than 2 seconds.     Comments: Skin breakdown between the fourth and fifth digits on the right with no obvious purulent drainage, some mild swelling  Neurological:     General: No focal deficit present.     Mental Status: She is alert.     ED Results / Procedures / Treatments   Labs (all labs ordered are listed, but only abnormal results are displayed) Labs Reviewed - No data to display  EKG None  Radiology DG Foot  Complete Right  Result Date: 02/12/2022 CLINICAL DATA:  Pain EXAM: RIGHT FOOT COMPLETE - 3+ VIEW COMPARISON:  Right foot radiographs 05/15/2021 FINDINGS: There is no evidence of fracture or dislocation. There is no evidence of arthropathy or other focal bone abnormality. Dorsal soft tissue swelling. IMPRESSION: 1. No acute osseous abnormality. 2. Dorsal soft tissue swelling. Electronically Signed   By: Audie Pinto M.D.   On: 02/12/2022 17:39    Procedures Procedures    Medications Ordered in ED Medications  doxycycline (VIBRA-TABS) tablet 100 mg (has no administration in time range)    ED Course/ Medical Decision Making/ A&P                           Medical Decision Making Amount and/or Complexity of Data Reviewed Radiology:  ordered.  Risk Prescription drug management.   Abigail Moran is here with concern for right foot infection.  History of diabetes.  Normal vitals.  No fever.  She is got some skin breakdown and may be some mild drainage/infection in between the webs of her right fourth and fifth toe.  X-ray showed no signs of osteomyelitis.  There is dorsal soft tissue swelling.  Overall suspect cellulitis.  We will put her on antibiotics.  She has a podiatrist she can follow-up with.  Discharged in good condition.  This chart was dictated using voice recognition software.  Despite best efforts to proofread,  errors can occur which can change the documentation meaning.         Final Clinical Impression(s) / ED Diagnoses Final diagnoses:  Cellulitis, unspecified cellulitis site    Rx / DC Orders ED Discharge Orders          Ordered    doxycycline (VIBRAMYCIN) 100 MG capsule  2 times daily        02/12/22 1827              Lennice Sites, DO 02/12/22 1828

## 2022-02-25 ENCOUNTER — Emergency Department (HOSPITAL_BASED_OUTPATIENT_CLINIC_OR_DEPARTMENT_OTHER): Payer: Self-pay

## 2022-02-25 ENCOUNTER — Inpatient Hospital Stay (HOSPITAL_BASED_OUTPATIENT_CLINIC_OR_DEPARTMENT_OTHER)
Admission: EM | Admit: 2022-02-25 | Discharge: 2022-03-06 | DRG: 853 | Disposition: A | Discharge status: Home / Self Care | Payer: Self-pay | Attending: Internal Medicine | Admitting: Internal Medicine

## 2022-02-25 ENCOUNTER — Encounter (HOSPITAL_BASED_OUTPATIENT_CLINIC_OR_DEPARTMENT_OTHER): Payer: Self-pay | Admitting: *Deleted

## 2022-02-25 ENCOUNTER — Other Ambulatory Visit: Payer: Self-pay

## 2022-02-25 DIAGNOSIS — E1069 Type 1 diabetes mellitus with other specified complication: Secondary | ICD-10-CM | POA: Diagnosis present

## 2022-02-25 DIAGNOSIS — Z91148 Patient's other noncompliance with medication regimen for other reason: Secondary | ICD-10-CM

## 2022-02-25 DIAGNOSIS — Z8249 Family history of ischemic heart disease and other diseases of the circulatory system: Secondary | ICD-10-CM

## 2022-02-25 DIAGNOSIS — E876 Hypokalemia: Secondary | ICD-10-CM | POA: Diagnosis present

## 2022-02-25 DIAGNOSIS — L03115 Cellulitis of right lower limb: Secondary | ICD-10-CM | POA: Diagnosis present

## 2022-02-25 DIAGNOSIS — Z833 Family history of diabetes mellitus: Secondary | ICD-10-CM

## 2022-02-25 DIAGNOSIS — E871 Hypo-osmolality and hyponatremia: Secondary | ICD-10-CM | POA: Diagnosis present

## 2022-02-25 DIAGNOSIS — E1022 Type 1 diabetes mellitus with diabetic chronic kidney disease: Secondary | ICD-10-CM | POA: Diagnosis present

## 2022-02-25 DIAGNOSIS — M868X7 Other osteomyelitis, ankle and foot: Secondary | ICD-10-CM | POA: Diagnosis present

## 2022-02-25 DIAGNOSIS — Z7982 Long term (current) use of aspirin: Secondary | ICD-10-CM

## 2022-02-25 DIAGNOSIS — E1010 Type 1 diabetes mellitus with ketoacidosis without coma: Secondary | ICD-10-CM | POA: Diagnosis present

## 2022-02-25 DIAGNOSIS — E1065 Type 1 diabetes mellitus with hyperglycemia: Secondary | ICD-10-CM

## 2022-02-25 DIAGNOSIS — N189 Chronic kidney disease, unspecified: Secondary | ICD-10-CM

## 2022-02-25 DIAGNOSIS — Z794 Long term (current) use of insulin: Secondary | ICD-10-CM

## 2022-02-25 DIAGNOSIS — F1210 Cannabis abuse, uncomplicated: Secondary | ICD-10-CM | POA: Diagnosis present

## 2022-02-25 DIAGNOSIS — E785 Hyperlipidemia, unspecified: Secondary | ICD-10-CM | POA: Diagnosis present

## 2022-02-25 DIAGNOSIS — R6520 Severe sepsis without septic shock: Secondary | ICD-10-CM | POA: Diagnosis present

## 2022-02-25 DIAGNOSIS — N1831 Chronic kidney disease, stage 3a: Secondary | ICD-10-CM | POA: Diagnosis present

## 2022-02-25 DIAGNOSIS — L089 Local infection of the skin and subcutaneous tissue, unspecified: Secondary | ICD-10-CM

## 2022-02-25 DIAGNOSIS — E1110 Type 2 diabetes mellitus with ketoacidosis without coma: Secondary | ICD-10-CM | POA: Diagnosis present

## 2022-02-25 DIAGNOSIS — L02611 Cutaneous abscess of right foot: Secondary | ICD-10-CM | POA: Diagnosis present

## 2022-02-25 DIAGNOSIS — Z83438 Family history of other disorder of lipoprotein metabolism and other lipidemia: Secondary | ICD-10-CM

## 2022-02-25 DIAGNOSIS — E860 Dehydration: Secondary | ICD-10-CM | POA: Diagnosis present

## 2022-02-25 DIAGNOSIS — I252 Old myocardial infarction: Secondary | ICD-10-CM

## 2022-02-25 DIAGNOSIS — E11628 Type 2 diabetes mellitus with other skin complications: Secondary | ICD-10-CM

## 2022-02-25 DIAGNOSIS — Z91199 Patient's noncompliance with other medical treatment and regimen due to unspecified reason: Secondary | ICD-10-CM

## 2022-02-25 DIAGNOSIS — N179 Acute kidney failure, unspecified: Secondary | ICD-10-CM | POA: Diagnosis present

## 2022-02-25 DIAGNOSIS — Z91040 Latex allergy status: Secondary | ICD-10-CM

## 2022-02-25 DIAGNOSIS — A419 Sepsis, unspecified organism: Principal | ICD-10-CM | POA: Diagnosis present

## 2022-02-25 DIAGNOSIS — E10628 Type 1 diabetes mellitus with other skin complications: Secondary | ICD-10-CM | POA: Diagnosis present

## 2022-02-25 DIAGNOSIS — Z91048 Other nonmedicinal substance allergy status: Secondary | ICD-10-CM

## 2022-02-25 DIAGNOSIS — L97519 Non-pressure chronic ulcer of other part of right foot with unspecified severity: Secondary | ICD-10-CM | POA: Diagnosis present

## 2022-02-25 DIAGNOSIS — Z79899 Other long term (current) drug therapy: Secondary | ICD-10-CM

## 2022-02-25 DIAGNOSIS — I129 Hypertensive chronic kidney disease with stage 1 through stage 4 chronic kidney disease, or unspecified chronic kidney disease: Secondary | ICD-10-CM | POA: Diagnosis present

## 2022-02-25 DIAGNOSIS — E861 Hypovolemia: Secondary | ICD-10-CM | POA: Diagnosis present

## 2022-02-25 DIAGNOSIS — Z6833 Body mass index (BMI) 33.0-33.9, adult: Secondary | ICD-10-CM

## 2022-02-25 DIAGNOSIS — D631 Anemia in chronic kidney disease: Secondary | ICD-10-CM | POA: Diagnosis present

## 2022-02-25 DIAGNOSIS — K3184 Gastroparesis: Secondary | ICD-10-CM | POA: Diagnosis present

## 2022-02-25 DIAGNOSIS — Z86718 Personal history of other venous thrombosis and embolism: Secondary | ICD-10-CM

## 2022-02-25 DIAGNOSIS — E10621 Type 1 diabetes mellitus with foot ulcer: Secondary | ICD-10-CM | POA: Diagnosis present

## 2022-02-25 DIAGNOSIS — I2510 Atherosclerotic heart disease of native coronary artery without angina pectoris: Secondary | ICD-10-CM | POA: Diagnosis present

## 2022-02-25 DIAGNOSIS — E1043 Type 1 diabetes mellitus with diabetic autonomic (poly)neuropathy: Secondary | ICD-10-CM | POA: Diagnosis present

## 2022-02-25 DIAGNOSIS — I10 Essential (primary) hypertension: Secondary | ICD-10-CM | POA: Diagnosis present

## 2022-02-25 LAB — CBC WITH DIFFERENTIAL/PLATELET
Abs Immature Granulocytes: 0.05 10*3/uL (ref 0.00–0.07)
Basophils Absolute: 0 10*3/uL (ref 0.0–0.1)
Basophils Relative: 0 %
Eosinophils Absolute: 0 10*3/uL (ref 0.0–0.5)
Eosinophils Relative: 0 %
HCT: 32.1 % — ABNORMAL LOW (ref 36.0–46.0)
Hemoglobin: 10.2 g/dL — ABNORMAL LOW (ref 12.0–15.0)
Immature Granulocytes: 0 %
Lymphocytes Relative: 6 %
Lymphs Abs: 0.8 10*3/uL (ref 0.7–4.0)
MCH: 28 pg (ref 26.0–34.0)
MCHC: 31.8 g/dL (ref 30.0–36.0)
MCV: 88.2 fL (ref 80.0–100.0)
Monocytes Absolute: 0.8 10*3/uL (ref 0.1–1.0)
Monocytes Relative: 7 %
Neutro Abs: 11.1 10*3/uL — ABNORMAL HIGH (ref 1.7–7.7)
Neutrophils Relative %: 87 %
Platelets: 304 10*3/uL (ref 150–400)
RBC: 3.64 MIL/uL — ABNORMAL LOW (ref 3.87–5.11)
RDW: 13.9 % (ref 11.5–15.5)
WBC: 12.9 10*3/uL — ABNORMAL HIGH (ref 4.0–10.5)
nRBC: 0 % (ref 0.0–0.2)

## 2022-02-25 LAB — CBG MONITORING, ED
Glucose-Capillary: 273 mg/dL — ABNORMAL HIGH (ref 70–99)
Glucose-Capillary: 332 mg/dL — ABNORMAL HIGH (ref 70–99)

## 2022-02-25 LAB — I-STAT VENOUS BLOOD GAS, ED
Acid-base deficit: 11 mmol/L — ABNORMAL HIGH (ref 0.0–2.0)
Bicarbonate: 13.7 mmol/L — ABNORMAL LOW (ref 20.0–28.0)
Calcium, Ion: 1.1 mmol/L — ABNORMAL LOW (ref 1.15–1.40)
HCT: 31 % — ABNORMAL LOW (ref 36.0–46.0)
Hemoglobin: 10.5 g/dL — ABNORMAL LOW (ref 12.0–15.0)
O2 Saturation: 59 %
Patient temperature: 100.1
Potassium: 4.4 mmol/L (ref 3.5–5.1)
Sodium: 127 mmol/L — ABNORMAL LOW (ref 135–145)
TCO2: 15 mmol/L — ABNORMAL LOW (ref 22–32)
pCO2, Ven: 29.1 mmHg — ABNORMAL LOW (ref 44–60)
pH, Ven: 7.284 (ref 7.25–7.43)
pO2, Ven: 36 mmHg (ref 32–45)

## 2022-02-25 LAB — BASIC METABOLIC PANEL
Anion gap: 19 — ABNORMAL HIGH (ref 5–15)
BUN: 16 mg/dL (ref 6–20)
CO2: 13 mmol/L — ABNORMAL LOW (ref 22–32)
Calcium: 8.2 mg/dL — ABNORMAL LOW (ref 8.9–10.3)
Chloride: 94 mmol/L — ABNORMAL LOW (ref 98–111)
Creatinine, Ser: 1.4 mg/dL — ABNORMAL HIGH (ref 0.44–1.00)
GFR, Estimated: 47 mL/min — ABNORMAL LOW (ref >60)
Glucose, Bld: 394 mg/dL — ABNORMAL HIGH (ref 70–99)
Potassium: 4.2 mmol/L (ref 3.5–5.1)
Sodium: 126 mmol/L — ABNORMAL LOW (ref 135–145)

## 2022-02-25 LAB — LACTIC ACID, PLASMA
Lactic Acid, Venous: 1.1 mmol/L (ref 0.5–1.9)
Lactic Acid, Venous: 1.3 mmol/L (ref 0.5–1.9)

## 2022-02-25 LAB — HEPATIC FUNCTION PANEL
ALT: 11 U/L (ref 0–44)
AST: 15 U/L (ref 15–41)
Albumin: 2.9 g/dL — ABNORMAL LOW (ref 3.5–5.0)
Alkaline Phosphatase: 130 U/L — ABNORMAL HIGH (ref 38–126)
Bilirubin, Direct: 0.1 mg/dL (ref 0.0–0.2)
Indirect Bilirubin: 1.1 mg/dL — ABNORMAL HIGH (ref 0.3–0.9)
Total Bilirubin: 1.2 mg/dL (ref 0.3–1.2)
Total Protein: 8.3 g/dL — ABNORMAL HIGH (ref 6.5–8.1)

## 2022-02-25 MED ORDER — ORAL CARE MOUTH RINSE
15.0000 mL | OROMUCOSAL | Status: DC | PRN
  Filled 2022-02-25: qty 15

## 2022-02-25 MED ORDER — SODIUM CHLORIDE 0.9 % IV BOLUS
1000.0000 mL | Freq: Once | INTRAVENOUS | Status: AC
  Administered 2022-02-25: 1000 mL via INTRAVENOUS

## 2022-02-25 MED ORDER — ACETAMINOPHEN 500 MG PO TABS
1000.0000 mg | ORAL_TABLET | Freq: Once | ORAL | Status: AC
  Administered 2022-02-25: 1000 mg via ORAL
  Filled 2022-02-25: qty 2

## 2022-02-25 MED ORDER — VANCOMYCIN HCL IN DEXTROSE 1-5 GM/200ML-% IV SOLN
1000.0000 mg | Freq: Once | INTRAVENOUS | Status: AC
  Administered 2022-02-25: 1000 mg via INTRAVENOUS
  Filled 2022-02-25: qty 200

## 2022-02-25 MED ORDER — DEXTROSE IN LACTATED RINGERS 5 % IV SOLN: INTRAVENOUS | Status: DC

## 2022-02-25 MED ORDER — LACTATED RINGERS IV SOLN: INTRAVENOUS | Status: DC

## 2022-02-25 MED ORDER — INSULIN REGULAR(HUMAN) IN NACL 100-0.9 UT/100ML-% IV SOLN
INTRAVENOUS | Status: DC
  Administered 2022-02-25: 9.5 [IU]/h via INTRAVENOUS
  Filled 2022-02-25: qty 100

## 2022-02-25 MED ORDER — VANCOMYCIN HCL 1750 MG/350ML IV SOLN
1750.0000 mg | Freq: Once | INTRAVENOUS | Status: DC
  Filled 2022-02-25: qty 350

## 2022-02-25 MED ORDER — DEXTROSE 50 % IV SOLN: 0.0000 mL | INTRAVENOUS | Status: DC | PRN

## 2022-02-25 MED ORDER — FENTANYL CITRATE PF 50 MCG/ML IJ SOSY
50.0000 ug | PREFILLED_SYRINGE | Freq: Once | INTRAMUSCULAR | Status: AC
  Administered 2022-02-25: 50 ug via INTRAVENOUS
  Filled 2022-02-25: qty 1

## 2022-02-25 MED ORDER — POTASSIUM CHLORIDE 10 MEQ/100ML IV SOLN
10.0000 meq | INTRAVENOUS | Status: AC
  Administered 2022-02-25 (×2): 10 meq via INTRAVENOUS
  Filled 2022-02-25 (×2): qty 100

## 2022-02-25 MED ORDER — CHLORHEXIDINE GLUCONATE CLOTH 2 % EX PADS
6.0000 | MEDICATED_PAD | Freq: Every day | CUTANEOUS | Status: DC
  Administered 2022-02-26 – 2022-03-02 (×6): 6 via TOPICAL
  Filled 2022-02-25: qty 6

## 2022-02-25 MED ORDER — SODIUM CHLORIDE 0.9 % IV SOLN
2.0000 g | Freq: Two times a day (BID) | INTRAVENOUS | Status: DC
  Filled 2022-02-25: qty 12.5

## 2022-02-25 MED ORDER — METRONIDAZOLE 500 MG PO TABS
500.0000 mg | ORAL_TABLET | Freq: Two times a day (BID) | ORAL | Status: DC
  Administered 2022-02-25: 500 mg via ORAL
  Filled 2022-02-25 (×2): qty 1

## 2022-02-25 MED ORDER — ONDANSETRON HCL 4 MG/2ML IJ SOLN
4.0000 mg | Freq: Once | INTRAMUSCULAR | Status: AC
Start: 2022-02-25 — End: 2022-02-25
  Administered 2022-02-25: 4 mg via INTRAVENOUS
  Filled 2022-02-25: qty 2

## 2022-02-25 MED ORDER — VANCOMYCIN HCL 500 MG IV SOLR
500.0000 mg | Freq: Once | INTRAVENOUS | Status: AC
  Administered 2022-02-26: 500 mg via INTRAVENOUS
  Filled 2022-02-25: qty 10

## 2022-02-25 MED ORDER — VANCOMYCIN HCL IN DEXTROSE 1-5 GM/200ML-% IV SOLN
1000.0000 mg | INTRAVENOUS | Status: DC
  Administered 2022-02-26 – 2022-02-27 (×2): 1000 mg via INTRAVENOUS
  Filled 2022-02-25 (×2): qty 200

## 2022-02-25 MED ORDER — SODIUM CHLORIDE 0.9 % IV SOLN
2.0000 g | Freq: Once | INTRAVENOUS | Status: AC
  Administered 2022-02-25: 2 g via INTRAVENOUS
  Filled 2022-02-25: qty 12.5

## 2022-02-25 NOTE — Progress Notes (Signed)
Pharmacy Antibiotic Note  Abigail Moran is a 46 y.o. female admitted on 02/25/2022 presenting with worsening toe infection with drainage, concern for diabetic foot infection.  Pharmacy has been consulted for vancomycin and cefepime dosing.  Plan: Vancomycin 1500 mg IV x 1, then 1000 mg IV q 24h (eAUC 531, SCr 1.4) Cefepime 2g IV every 12 hours Monitor renal function, Cx and clinical progression to narrow Vancomycin levels as needed  Weight: 90.7 kg (200 lb)  Temp (24hrs), Avg:100 F (37.8 C), Min:99.1 F (37.3 C), Max:100.8 F (38.2 C)  Recent Labs  Lab 02/25/22 2040  WBC 12.9*  CREATININE 1.40*    Estimated Creatinine Clearance: 55.8 mL/min (A) (by C-G formula based on SCr of 1.4 mg/dL (H)).    Allergies  Allergen Reactions   Latex Itching   Tape Hives    Daylene Posey, PharmD Clinical Pharmacist ED Pharmacist Phone # [phone] 02/25/2022 9:12 PM

## 2022-02-25 NOTE — Progress Notes (Signed)
  [facility] will assume care on arrival to accepting facility. Until arrival, care as per EDP. However, [facility] available 24/7 for questions and assistance.   Nursing staff please page [facility] Admits and Consults ([phone]) as soon as the patient arrives to the hospital.  Katura Eatherly, DO [facility]  

## 2022-02-25 NOTE — ED Provider Notes (Signed)
[facility] EMERGENCY DEPARTMENT Provider Note   CSN: [id number] Arrival date & time: 02/25/22  1926     History  Chief Complaint  Patient presents with   Toe Pain    Abigail Moran is a 46 y.o. female with a past medical history of diabetes who presents to the emergency department with concerns for right toe pain onset past couple days.  Notes that her typical sugars at home range from 140-180.  Patient was evaluated in the emergency department on 02/12/2022 and discharged with prescription for doxycycline, patient was not able to tolerate the antibiotic and was not able to complete it due to that.  Has associated swelling, malodorous drainage.  No additional meds tried at home.  Denies fever.  The history is provided by the patient. No language interpreter was used.       Home Medications Prior to Admission medications   Medication Sig Start Date End Date Taking? Authorizing Provider  aspirin EC 81 MG tablet Take 81 mg by mouth daily.    [provider]  atorvastatin (LIPITOR) 10 MG tablet Take 1 tablet (10 mg total) by mouth daily. 06/09/21   [name], NP  blood glucose meter kit and supplies KIT Dispense based on patient and insurance preference. Use up to four times daily as directed. E11.9 09/28/20   [name], NP  doxycycline (VIBRAMYCIN) 100 MG capsule Take 1 capsule (100 mg total) by mouth 2 (two) times daily. 02/12/22   Curatolo, Adam, DO  glucose blood (FREESTYLE LITE) test strip USE FOUR TIMES DAILY 10/14/20   Philemon Kingdom, MD  insulin aspart (FIASP FLEXTOUCH) 100 UNIT/ML FlexTouch Pen Inject 6-10 Units into the skin 3 (three) times daily between meals. Patient taking differently: Inject 4-10 Units into the skin 3 (three) times daily between meals. 10/14/20   Philemon Kingdom, MD  insulin degludec (TRESIBA FLEXTOUCH) 200 UNIT/ML FlexTouch Pen Inject 20 Units into the skin daily. 05/21/21   Mariel Aloe, MD  Insulin Pen Needle 32G X 4  MM MISC Use 4x a day 10/14/20   Philemon Kingdom, MD  Lancets (FREESTYLE) lancets Use 4x a day 09/28/20   [name], NP  lisinopril (ZESTRIL) 10 MG tablet Take 1 tablet (10 mg total) by mouth daily. 05/26/21   [name], NP  Multiple Vitamin (MULTIVITAMIN) tablet Take 1 tablet by mouth daily.    [provider]  Vitamin D, Ergocalciferol, (DRISDOL) 1.25 MG (50000 UNIT) CAPS capsule Take 1 capsule (50,000 Units total) by mouth every 7 (seven) days. 06/12/21   [name], NP      Allergies    Latex and Tape    Review of Systems   Review of Systems  Constitutional:  Negative for fever.  Respiratory:  Negative for shortness of breath.   Cardiovascular:  Negative for chest pain.  Gastrointestinal:  Negative for nausea and vomiting.  Musculoskeletal:  Positive for arthralgias and joint swelling.  Skin:  Positive for wound. Negative for color change.  All other systems reviewed and are negative.   Physical Exam Updated Vital Signs BP 119/79   Pulse (!) 116   Temp (!) 100.8 F (38.2 C) (Oral)   Resp 19   Wt 90.7 kg   LMP 02/24/2022 (Exact Date)   SpO2 100%   BMI 32.78 kg/m  Physical Exam Vitals and nursing note reviewed.  Constitutional:      General: She is not in acute distress.    Appearance: She is not diaphoretic.  HENT:  Head: Normocephalic and atraumatic.     Mouth/Throat:     Pharynx: No oropharyngeal exudate.  Eyes:     General: No scleral icterus.    Conjunctiva/sclera: Conjunctivae normal.  Cardiovascular:     Rate and Rhythm: Normal rate and regular rhythm.     Pulses: Normal pulses.     Heart sounds: Normal heart sounds.  Pulmonary:     Effort: Pulmonary effort is normal. No respiratory distress.     Breath sounds: Normal breath sounds. No wheezing.  Abdominal:     General: Bowel sounds are normal.     Palpations: Abdomen is soft. There is no mass.     Tenderness: There is no abdominal tenderness. There is no guarding  or rebound.  Musculoskeletal:        General: Normal range of motion.     Cervical back: Normal range of motion and neck supple.     Comments: tenderness to palpation noted to right fourth/fifth metatarsal with edema noted to the area.  Pedal pulses intact.  Tenderness to palpation noted between right fourth and fifth toes with skin breakdown between fourth and fifth digits.  No obvious purulent drainage noted to the area. See pictures below.  Skin:    General: Skin is warm and dry.  Neurological:     Mental Status: She is alert.  Psychiatric:        Behavior: Behavior normal.        ED Results / Procedures / Treatments   Labs (all labs ordered are listed, but only abnormal results are displayed) Labs Reviewed  CBC WITH DIFFERENTIAL/PLATELET - Abnormal; Notable for the following components:      Result Value   WBC 12.9 (*)    RBC 3.64 (*)    Hemoglobin 10.2 (*)    HCT 32.1 (*)    Neutro Abs 11.1 (*)    All other components within normal limits  BASIC METABOLIC PANEL - Abnormal; Notable for the following components:   Sodium 126 (*)    Chloride 94 (*)    CO2 13 (*)    Glucose, Bld 394 (*)    Creatinine, Ser 1.40 (*)    Calcium 8.2 (*)    GFR, Estimated 47 (*)    Anion gap 19 (*)    All other components within normal limits  CULTURE, BLOOD (ROUTINE X 2)  CULTURE, BLOOD (ROUTINE X 2)  LACTIC ACID, PLASMA  LACTIC ACID, PLASMA  URINALYSIS, ROUTINE W REFLEX MICROSCOPIC    EKG None  Radiology DG Foot Complete Right  Result Date: 02/25/2022 CLINICAL DATA:  Right foot pain EXAM: RIGHT FOOT COMPLETE - 3+ VIEW COMPARISON:  02/12/2022 FINDINGS: There is no evidence of fracture or dislocation. There is no evidence of arthropathy or other focal bone abnormality. Soft tissues are unremarkable. IMPRESSION: Negative. Electronically Signed   By: Rolm Baptise M.D.   On: 02/25/2022 21:13    Procedures Procedures    Medications Ordered in ED Medications  acetaminophen (TYLENOL)  tablet 1,000 mg (has no administration in time range)  ondansetron (ZOFRAN) injection 4 mg (has no administration in time range)  metroNIDAZOLE (FLAGYL) tablet 500 mg (has no administration in time range)  ceFEPIme (MAXIPIME) 2 g in sodium chloride 0.9 % 100 mL IVPB (has no administration in time range)  vancomycin (VANCOCIN) IVPB 1000 mg/200 mL premix (has no administration in time range)    Followed by  vancomycin (VANCOCIN) 500 mg in sodium chloride 0.9 % 100 mL IVPB (has no  administration in time range)  vancomycin (VANCOCIN) IVPB 1000 mg/200 mL premix (has no administration in time range)  ceFEPIme (MAXIPIME) 2 g in sodium chloride 0.9 % 100 mL IVPB (has no administration in time range)    ED Course/ Medical Decision Making/ A&P Clinical Course as of 02/25/22 2232  Sun Feb 25, 2022  2130 Discussed with patient plans for admission. Patient agreeable at this time.  [SB]  2231 Attending consultation with hospitalist, Dr. Bridgett Larsson who will admit the patient. [SB]    Clinical Course User Index [SB] Michaelpaul Apo A, PA-C                           Medical Decision Making Amount and/or Complexity of Data Reviewed Labs: ordered. Radiology: ordered.  Risk OTC drugs. Prescription drug management. Decision regarding hospitalization.   Pt presents with right toe pain onset past couple of days. History of DM, placed on doxy recently, unable to complete the antibiotic due to not feeling well while taking it.  Patient initially tachycardic at 116, patient febrile prior to admission at 100.8 and tachycardic at 116. On exam, pt with tenderness to palpation noted to right fourth/fifth metatarsal with edema noted to the area.  Pedal pulses intact.  Tenderness to palpation noted between right fourth and fifth toes with skin breakdown between fourth and fifth digits.  No obvious purulent drainage noted to the area.  No acute cardiovascular, respiratory, abdominal exam findings. Differential diagnosis  includes osteomyelitis, diabetic foot, cellulitis, abscess, DKA.    Co morbidities that complicate the patient evaluation: Type 1 DM HLD  Additional history obtained:  External records from outside source obtained and reviewed including: Patient was evaluated in the emergency department on 02/12/2022 for similar concerns.  At that time was sent home with a prescription for doxycycline.  X-ray was negative for osteomyelitis at that time.  Labs:  I ordered, and personally interpreted labs.  The pertinent results include:   CBG at 332. BMP with sodium at 126, bicarb decreased at 13, glucose at 394, creatinine elevated at 1.4, GFR decreased at 47, anion gap at 19. CBC with leukocytosis at 12.9. Lactic acid, beta hydroxybutyrate acid, blood cultures ordered with results pending at time of admission.  Imaging: I ordered imaging studies including right foot xray I independently visualized and interpreted imaging which showed: No acute concerns for osteomyelitis I agree with the radiologist interpretation  Medications:  I ordered medication including vancomycin, cefepime, Flagyl for antibiotic treatment.  Tylenol and fentanyl given for fever and pain management.  IV fluids and treatment for DKA in the emergency department.  I have reviewed the patients home medicines and have made adjustments as needed   Consultations: I requested consultation with the Hospitalist, Dr. Bridgett Larsson and discussed lab and imaging findings as well as pertinent plan - they recommend: admission to the hospital   Disposition: Presentation suspicious for sepsis.  Also suspicious for diabetic leg infection as well as DKA.  Doubt cellulitis at this time.  No obvious abscess needing drainage at this time. Patient to be admitted to the hospital due to failed outpatient antibiotic therapy. After consideration of the diagnostic results and the patients response to treatment, I feel that the patient would benefit from Admission to  the hospital. Case discussed with attending who agrees with admission plans.  Discussed with patient plans for admission who agrees with admission at this time.    This chart was dictated using voice recognition software,  Dragon. Despite the best efforts of this provider to proofread and correct errors, errors may still occur which can change documentation meaning.   Final Clinical Impression(s) / ED Diagnoses Final diagnoses:  Sepsis, due to unspecified organism, unspecified whether acute organ dysfunction present [facility])  Diabetic foot infection ([place])  Diabetic ketoacidosis without coma associated with type 1 diabetes mellitus [facility])    Rx / DC Orders ED Discharge Orders     None         Laisha Rau A, PA-C 02/25/22 2252    Lennice Sites, DO 02/26/22 1616

## 2022-02-25 NOTE — ED Triage Notes (Signed)
Pt is here for reevaluation of toe problem.  Pt tells me that she was seen for this on 8/7 and d/c with prescription for antibiotic.  Pt reports that she was not able to tolerate the antibiotic and was not able to complete it.  Pt has swelling and pain between 4th and 5th toe.  Pt has malodorous drainage.  Pt is diabetic, reports CBG has been 140-180.

## 2022-02-25 NOTE — ED Provider Notes (Signed)
Shared visit, history, physical, medical decision making I obtained and performed.  Patient here with ongoing right toe pain and swelling.  Patient arrives febrile tachycardic.  I actually saw this patient several weeks ago and prescribed her doxycycline for diabetic foot infection that looked localized.  She states that she did not really take the antibiotic and did not really tolerate it very well she has been having some nausea and vomiting and generalized fatigue recently.  Has not been taking her insulin as much because she has not been eating as much.  Overall sepsis work-up initiated given concern for worsening diabetic foot infection.  Differential diagnosis likely sepsis from diabetic foot infection, could be osteomyelitis.  Suspect she might be in DKA.  We will check CBC, CMP, blood gas, ketones, urinalysis, x-ray of the foot, chest x-ray.  Will give fluid bolus.  Lab work is consistent with DKA.  Acidotic with a pH is 7.29, CO2 of 13, sugar in the 300s, anion gap is elevated at 19.  She has a mild leukocytosis of 12.  X-ray shows edema but no signs of osteomyelitis.  She got good pulses in her lower extremity.  Overall patient appears to be in DKA also complicated with foot infection.  Broad-spectrum IV antibiotics have been started.  We will start her on DKA protocol with insulin bolus and infusion.  We will admit her to medicine for further care.  This chart was dictated using voice recognition software.  Despite best efforts to proofread,  errors can occur which can change the documentation meaning.    Virgina Norfolk, DO 02/25/22 2224

## 2022-02-26 ENCOUNTER — Inpatient Hospital Stay (HOSPITAL_COMMUNITY): Payer: Self-pay

## 2022-02-26 DIAGNOSIS — L0390 Cellulitis, unspecified: Secondary | ICD-10-CM

## 2022-02-26 DIAGNOSIS — E11628 Type 2 diabetes mellitus with other skin complications: Secondary | ICD-10-CM

## 2022-02-26 DIAGNOSIS — N189 Chronic kidney disease, unspecified: Secondary | ICD-10-CM

## 2022-02-26 DIAGNOSIS — E1110 Type 2 diabetes mellitus with ketoacidosis without coma: Secondary | ICD-10-CM | POA: Diagnosis present

## 2022-02-26 DIAGNOSIS — N179 Acute kidney failure, unspecified: Secondary | ICD-10-CM

## 2022-02-26 DIAGNOSIS — E1010 Type 1 diabetes mellitus with ketoacidosis without coma: Secondary | ICD-10-CM

## 2022-02-26 DIAGNOSIS — L089 Local infection of the skin and subcutaneous tissue, unspecified: Secondary | ICD-10-CM

## 2022-02-26 LAB — CBC
HCT: 31.1 % — ABNORMAL LOW (ref 36.0–46.0)
Hemoglobin: 9.9 g/dL — ABNORMAL LOW (ref 12.0–15.0)
MCH: 28.2 pg (ref 26.0–34.0)
MCHC: 31.8 g/dL (ref 30.0–36.0)
MCV: 88.6 fL (ref 80.0–100.0)
Platelets: 303 10*3/uL (ref 150–400)
RBC: 3.51 MIL/uL — ABNORMAL LOW (ref 3.87–5.11)
RDW: 13.8 % (ref 11.5–15.5)
WBC: 12.2 10*3/uL — ABNORMAL HIGH (ref 4.0–10.5)
nRBC: 0 % (ref 0.0–0.2)

## 2022-02-26 LAB — BASIC METABOLIC PANEL
Anion gap: 14 (ref 5–15)
Anion gap: 10 (ref 5–15)
BUN: 16 mg/dL (ref 6–20)
BUN: 16 mg/dL (ref 6–20)
CO2: 15 mmol/L — ABNORMAL LOW (ref 22–32)
CO2: 17 mmol/L — ABNORMAL LOW (ref 22–32)
Calcium: 7.9 mg/dL — ABNORMAL LOW (ref 8.9–10.3)
Calcium: 7.7 mg/dL — ABNORMAL LOW (ref 8.9–10.3)
Chloride: 102 mmol/L (ref 98–111)
Chloride: 105 mmol/L (ref 98–111)
Creatinine, Ser: 1.28 mg/dL — ABNORMAL HIGH (ref 0.44–1.00)
Creatinine, Ser: 1.25 mg/dL — ABNORMAL HIGH (ref 0.44–1.00)
GFR, Estimated: 52 mL/min — ABNORMAL LOW (ref >60)
GFR, Estimated: 53 mL/min — ABNORMAL LOW (ref >60)
Glucose, Bld: 157 mg/dL — ABNORMAL HIGH (ref 70–99)
Glucose, Bld: 164 mg/dL — ABNORMAL HIGH (ref 70–99)
Potassium: 4.1 mmol/L (ref 3.5–5.1)
Potassium: 3.8 mmol/L (ref 3.5–5.1)
Sodium: 131 mmol/L — ABNORMAL LOW (ref 135–145)
Sodium: 132 mmol/L — ABNORMAL LOW (ref 135–145)

## 2022-02-26 LAB — GLUCOSE, CAPILLARY
Glucose-Capillary: 119 mg/dL — ABNORMAL HIGH (ref 70–99)
Glucose-Capillary: 128 mg/dL — ABNORMAL HIGH (ref 70–99)
Glucose-Capillary: 96 mg/dL (ref 70–99)
Glucose-Capillary: 192 mg/dL — ABNORMAL HIGH (ref 70–99)
Glucose-Capillary: 184 mg/dL — ABNORMAL HIGH (ref 70–99)
Glucose-Capillary: 180 mg/dL — ABNORMAL HIGH (ref 70–99)
Glucose-Capillary: 156 mg/dL — ABNORMAL HIGH (ref 70–99)
Glucose-Capillary: 163 mg/dL — ABNORMAL HIGH (ref 70–99)
Glucose-Capillary: 142 mg/dL — ABNORMAL HIGH (ref 70–99)
Glucose-Capillary: 161 mg/dL — ABNORMAL HIGH (ref 70–99)
Glucose-Capillary: 154 mg/dL — ABNORMAL HIGH (ref 70–99)
Glucose-Capillary: 154 mg/dL — ABNORMAL HIGH (ref 70–99)
Glucose-Capillary: 222 mg/dL — ABNORMAL HIGH (ref 70–99)

## 2022-02-26 LAB — MRSA NEXT GEN BY PCR, NASAL: MRSA by PCR Next Gen: NOT DETECTED

## 2022-02-26 LAB — URINALYSIS, ROUTINE W REFLEX MICROSCOPIC
Bilirubin Urine: NEGATIVE
Glucose, UA: >=500 mg/dL — AB
Ketones, ur: 80 mg/dL — AB
Leukocytes,Ua: NEGATIVE
Nitrite: NEGATIVE
Protein, ur: 100 mg/dL — AB
RBC / HPF: >50 RBC/hpf — ABNORMAL HIGH (ref 0–5)
Specific Gravity, Urine: 1.011 (ref 1.005–1.030)
pH: 5 (ref 5.0–8.0)

## 2022-02-26 LAB — HEMOGLOBIN A1C
Hgb A1c MFr Bld: 13.1 % — ABNORMAL HIGH (ref 4.8–5.6)
Mean Plasma Glucose: 329.27 mg/dL

## 2022-02-26 LAB — BETA-HYDROXYBUTYRIC ACID
Beta-Hydroxybutyric Acid: 7.16 mmol/L — ABNORMAL HIGH (ref 0.05–0.27)
Beta-Hydroxybutyric Acid: 2.05 mmol/L — ABNORMAL HIGH (ref 0.05–0.27)

## 2022-02-26 MED ORDER — INSULIN ASPART 100 UNIT/ML IJ SOLN
0.0000 [IU] | Freq: Three times a day (TID) | INTRAMUSCULAR | Status: DC
  Administered 2022-02-26: 5 [IU] via SUBCUTANEOUS
  Administered 2022-02-26: 3 [IU] via SUBCUTANEOUS
  Administered 2022-02-27: 5 [IU] via SUBCUTANEOUS
  Administered 2022-02-27: 8 [IU] via SUBCUTANEOUS
  Administered 2022-02-27: 5 [IU] via SUBCUTANEOUS
  Administered 2022-02-28: 2 [IU] via SUBCUTANEOUS
  Administered 2022-02-28: 8 [IU] via SUBCUTANEOUS
  Administered 2022-02-28: 3 [IU] via SUBCUTANEOUS
  Administered 2022-03-01: 5 [IU] via SUBCUTANEOUS
  Administered 2022-03-01: 2 [IU] via SUBCUTANEOUS
  Administered 2022-03-03: 3 [IU] via SUBCUTANEOUS
  Administered 2022-03-03: 15 [IU] via SUBCUTANEOUS
  Administered 2022-03-04: 4 [IU] via SUBCUTANEOUS
  Administered 2022-03-04 (×2): 5 [IU] via SUBCUTANEOUS
  Administered 2022-03-05: 8 [IU] via SUBCUTANEOUS
  Administered 2022-03-05: 5 [IU] via SUBCUTANEOUS
  Administered 2022-03-06: 8 [IU] via SUBCUTANEOUS
  Administered 2022-03-06 (×2): 15 [IU] via SUBCUTANEOUS

## 2022-02-26 MED ORDER — SODIUM CHLORIDE 0.9 % IV SOLN: INTRAVENOUS | Status: DC | PRN

## 2022-02-26 MED ORDER — SODIUM CHLORIDE 0.9 % IV SOLN
2.0000 g | Freq: Three times a day (TID) | INTRAVENOUS | Status: DC
  Administered 2022-02-26 – 2022-03-05 (×19): 2 g via INTRAVENOUS
  Filled 2022-02-26 (×20): qty 12.5

## 2022-02-26 MED ORDER — ACETAMINOPHEN 650 MG RE SUPP: 650.0000 mg | Freq: Four times a day (QID) | RECTAL | Status: DC | PRN

## 2022-02-26 MED ORDER — METRONIDAZOLE 500 MG/100ML IV SOLN
500.0000 mg | Freq: Two times a day (BID) | INTRAVENOUS | Status: DC
  Administered 2022-02-26 – 2022-03-05 (×15): 500 mg via INTRAVENOUS
  Filled 2022-02-26 (×15): qty 100

## 2022-02-26 MED ORDER — ACETAMINOPHEN 325 MG PO TABS
650.0000 mg | ORAL_TABLET | Freq: Four times a day (QID) | ORAL | Status: DC | PRN
  Administered 2022-02-27 (×2): 650 mg via ORAL
  Filled 2022-02-26 (×2): qty 2

## 2022-02-26 MED ORDER — MORPHINE SULFATE (PF) 2 MG/ML IV SOLN
1.0000 mg | INTRAVENOUS | Status: DC | PRN
  Administered 2022-02-26 – 2022-03-02 (×14): 1 mg via INTRAVENOUS
  Filled 2022-02-26 (×15): qty 1

## 2022-02-26 MED ORDER — ONDANSETRON HCL 4 MG/2ML IJ SOLN: 4.0000 mg | Freq: Four times a day (QID) | INTRAMUSCULAR | Status: DC | PRN

## 2022-02-26 MED ORDER — INSULIN GLARGINE-YFGN 100 UNIT/ML ~~LOC~~ SOLN
15.0000 [IU] | Freq: Every day | SUBCUTANEOUS | Status: DC
  Administered 2022-02-26: 15 [IU] via SUBCUTANEOUS
  Filled 2022-02-26 (×2): qty 0.15

## 2022-02-26 MED ORDER — INSULIN ASPART 100 UNIT/ML IJ SOLN
0.0000 [IU] | Freq: Every day | INTRAMUSCULAR | Status: DC
  Administered 2022-02-26: 2 [IU] via SUBCUTANEOUS
  Administered 2022-02-27: 4 [IU] via SUBCUTANEOUS
  Administered 2022-02-28 – 2022-03-04 (×3): 2 [IU] via SUBCUTANEOUS
  Administered 2022-03-05: 3 [IU] via SUBCUTANEOUS

## 2022-02-26 MED ORDER — SODIUM CHLORIDE 0.45 % IV SOLN: INTRAVENOUS | Status: DC

## 2022-02-26 NOTE — Consult Note (Signed)
   Consult received via secure Chat with Dr. Osvaldo Shipper. MRI pending. Will plan to see for full consult after MRI completed. In the meantime betadine wet to dry gauze to the area daily. Order placed  Felecia Shelling, DPM [facility]  Dr. Felecia Shelling, DPM    [address] [place], [place] [address]                Office [phone]  Fax [phone]

## 2022-02-26 NOTE — Progress Notes (Signed)
ABI/TBI study completed. Please see CV Proc for preliminary results.  Elaura Calix BS, RVT 02/26/2022 3:13 PM

## 2022-02-26 NOTE — Progress Notes (Signed)
[facility] PROGRESS NOTE   Abigail Moran [id number] DOB: 02/23/1976 DOA: 02/25/2022  PCP: [name], NP  Brief History/Interval Summary: 46 y.o. female with medical history significant of uncontrolled type I diabetes, medical noncompliance, gastroparesis, marijuana abuse, erythropoietin deficiency anemia, right foot cellulitis and abscess in 05/2021, DVT, nephrolithiasis, menorrhagia, CKD stage II-IIIa, vitamin D deficiency, CAD, hypertension, hyperlipidemia.  Recently seen in the ED on 8/7 for right foot infection.  X-ray was negative for osteomyelitis and she was discharged with prescription for doxycycline.  She did not take the antibiotic and also noncompliant with insulin.  Returned to the ED with toe pain/worsening infection of her right foot.  She was found to be in DKA.  She was hospitalized for further management.   Consultants: Podiatry, Dr. Amalia Hailey.  Procedures: None yet    Subjective/Interval History: Patient somnolent but easily arousable.  Complains of pain in the right foot along with swelling.  Had fever at home.  Denies any nausea or vomiting.  She feels better this morning compared to yesterday.    Assessment/Plan:  Diabetic ketoacidosis in the setting of diabetes mellitus type 1 Came in with DKA.  Was started on IV insulin.  Metabolic panel shows improvement in bicarbonate level with closure of anion gap this morning.  We will transition her to long-acting insulin. HbA1c 13.1 implying long-term noncompliance.  Diabetes coordinator to see the patient.  Diabetic infection right foot, sepsis present on admission Patient had tachycardia leukocytosis fever at the time of admission.  Lactic acid was not elevated. X-ray was done of the right foot which did not show any obvious osteomyelitis.  Discussed with podiatrist Dr. Amalia Hailey.  We will proceed with MRI of the right foot.  He will evaluate the patient later today. Continue with vancomycin cefepime and  metronidazole.  WBC was elevated at 12.2. We will check ABIs.  Acute kidney injury on chronic kidney disease stage IIIa Baseline creatinine 1.0.  Presented with creatinine of 1.4.  Seems to have improved slightly.  Monitor urine output.  Avoid nephrotoxic agents.  Continue IV fluids for now.  Hyponatremia Likely due to hypovolemia along with hyperglycemia.  Seems to be stable.  Anemia of chronic kidney disease No evidence of overt bleeding.  Hemoglobin noted to be stable for the most part.  Essential hypertension Holding lisinopril.  Monitor blood pressures closely.  Coronary artery disease Seems to be stable from a cardiac standpoint.  Hyperlipidemia Resume statin.  Medical noncompliance Patient to be counseled once she is more awake alert and able to engage in conversations.  She needs to be counseled regarding importance of compliance with her medical treatment.  Obesity Estimated body mass index is 33.27 kg/m as calculated from the following:   Height as of this encounter: 5' 5.5" (1.664 m).   Weight as of this encounter: 92.1 kg.  DVT Prophylaxis: Subcutaneous heparin will be initiated Code Status: Full code Family Communication: Discussed with the patient Disposition Plan: Hopefully return home when improved  Status is: Inpatient Remains inpatient appropriate because: Need for IV antibiotics, DKA      Medications: Scheduled:  Chlorhexidine Gluconate Cloth  6 each Topical Daily   metroNIDAZOLE  500 mg Oral Q12H   Continuous:  sodium chloride 10 mL/hr at 02/26/22 0446   ceFEPime (MAXIPIME) IV     dextrose 5% lactated ringers 125 mL/hr at 02/26/22 0900   insulin 1 Units/hr (02/26/22 0900)   vancomycin     [id number] chloride, acetaminophen **OR** acetaminophen, dextrose, morphine injection, mouth  rinse  Antibiotics: Anti-infectives (From admission, onward)    Start     Dose/Rate Route Frequency Ordered Stop   02/26/22 2200  vancomycin (VANCOCIN) IVPB 1000  mg/200 mL premix        1,000 mg 200 mL/hr over 60 Minutes Intravenous Every 24 hours 02/25/22 2120     02/26/22 1000  ceFEPIme (MAXIPIME) 2 g in sodium chloride 0.9 % 100 mL IVPB        2 g 200 mL/hr over 30 Minutes Intravenous Every 12 hours 02/25/22 2120     02/25/22 2200  metroNIDAZOLE (FLAGYL) tablet 500 mg        500 mg Oral Every 12 hours 02/25/22 2056 03/04/22 2159   02/25/22 2200  vancomycin (VANCOCIN) 500 mg in sodium chloride 0.9 % 100 mL IVPB       See Hyperspace for full Linked Orders Report.   500 mg 100 mL/hr over 60 Minutes Intravenous  Once 02/25/22 2102 02/26/22 0605   02/25/22 2115  ceFEPIme (MAXIPIME) 2 g in sodium chloride 0.9 % 100 mL IVPB        2 g 200 mL/hr over 30 Minutes Intravenous  Once 02/25/22 2101 02/25/22 2236   02/25/22 2115  vancomycin (VANCOREADY) IVPB 1750 mg/350 mL  Status:  Discontinued        1,750 mg 175 mL/hr over 120 Minutes Intravenous  Once 02/25/22 2101 02/25/22 2102   02/25/22 2115  vancomycin (VANCOCIN) IVPB 1000 mg/200 mL premix       See Hyperspace for full Linked Orders Report.   1,000 mg 200 mL/hr over 60 Minutes Intravenous  Once 02/25/22 2102 02/25/22 2330       Objective:  Vital Signs  Vitals:   02/26/22 0400 02/26/22 0500 02/26/22 0600 02/26/22 0645  BP: 103/63 (!) 135/91 101/60   Pulse:      Resp: 15 16 15    Temp: 98.2 F (36.8 C)   98.3 F (36.8 C)  TempSrc: Oral   Oral  SpO2:      Weight:      Height:        Intake/Output Summary (Last 24 hours) at 02/26/2022 0925 Last data filed at 02/26/2022 0600 Gross per 24 hour  Intake 845.32 ml  Output 800 ml  Net 45.32 ml   Filed Weights   02/25/22 1943 02/26/22 0120  Weight: 90.7 kg 92.1 kg    General appearance: Somnolent but easily arousable.  In no distress Resp: Clear to auscultation bilaterally.  Normal effort Cardio: S1-S2 is normal regular.  No S3-S4.  No rubs murmurs or bruit GI: Abdomen is soft.  Nontender nondistended.  Bowel sounds are present  normal.  No masses organomegaly Extremities: Swelling of the right foot is noted with wound between the fourth and fifth toe with foul-smelling drainage which is yellowish in appearance.  There is swelling of the foot with tenderness.  Pulses were poorly palpable. Neurologic: Alert and oriented x3.  No focal neurological deficits.    Lab Results:  Data Reviewed: I have personally reviewed following labs and reports of the imaging studies  CBC: Recent Labs  Lab 02/25/22 2040 02/25/22 2215 02/26/22 0257  WBC 12.9*  --  12.2*  NEUTROABS 11.1*  --   --   HGB 10.2* 10.5* 9.9*  HCT 32.1* 31.0* 31.1*  MCV 88.2  --  88.6  PLT 304  --  303    Basic Metabolic Panel: Recent Labs  Lab 02/25/22 2040 02/25/22 2215 02/26/22 0257 02/26/22 02/28/22  NA 126* 127* 131* 132*  K 4.2 4.4 4.1 3.8  CL 94*  --  102 105  CO2 13*  --  15* 17*  GLUCOSE 394*  --  157* 164*  BUN 16  --  16 16  CREATININE 1.40*  --  1.28* 1.25*  CALCIUM 8.2*  --  7.9* 7.7*    GFR: Estimated Creatinine Clearance: 63.1 mL/min (A) (by C-G formula based on SCr of 1.25 mg/dL (H)).  Liver Function Tests: Recent Labs  Lab 02/25/22 2230  AST 15  ALT 11  ALKPHOS 130*  BILITOT 1.2  PROT 8.3*  ALBUMIN 2.9*     HbA1C: Recent Labs    02/26/22 0257  HGBA1C 13.1*    CBG: Recent Labs  Lab 02/26/22 0418 02/26/22 0501 02/26/22 0607 02/26/22 0707 02/26/22 0809  GLUCAP 184* 180* 156* 163* 142*     Recent Results (from the past 240 hour(s))  MRSA Next Gen by PCR, Nasal     Status: None   Collection Time: 02/26/22  1:19 AM   Specimen: Nasal Mucosa; Nasal Swab  Result Value Ref Range Status   MRSA by PCR Next Gen NOT DETECTED NOT DETECTED Final    Comment: (NOTE) The GeneXpert MRSA Assay (FDA approved for NASAL specimens only), is one component of a comprehensive MRSA colonization surveillance program. It is not intended to diagnose MRSA infection nor to guide or monitor treatment for MRSA  infections. Test performance is not FDA approved in patients less than 52 years old. Performed at [facility], [address] [place], [place] [address]       Radiology Studies: DG Foot Complete Right  Result Date: 02/25/2022 CLINICAL DATA:  Right foot pain EXAM: RIGHT FOOT COMPLETE - 3+ VIEW COMPARISON:  02/12/2022 FINDINGS: There is no evidence of fracture or dislocation. There is no evidence of arthropathy or other focal bone abnormality. Soft tissues are unremarkable. IMPRESSION: Negative. Electronically Signed   By: Charlett Nose M.D.   On: 02/25/2022 21:13       LOS: 0 days   Dejana Pugsley Rito Ehrlich  [facility] Pager on [email or web address]  02/26/2022, 9:25 AM

## 2022-02-26 NOTE — Progress Notes (Signed)
Pharmacy Antibiotic Note  Abigail Moran is a 46 y.o. female admitted on 02/25/2022 presenting with worsening toe infection with drainage, concern for diabetic foot infection.  Pharmacy has been consulted for vancomycin and cefepime dosing.  SCr has trended down some, warranting a need to increase cefepime dose.    Plan: Vancomycin 1500 mg IV x 1, then 1000 mg IV q 24h (eAUC 531, SCr 1.4) Increase cefepime dose to Cefepime 2g IV every 8  hours Monitor renal function, Cx and clinical progression to narrow Vancomycin levels as needed  Height: 5' 5.5" (166.4 cm) Weight: 92.1 kg (203 lb 0.7 oz) IBW/kg (Calculated) : 58.15  Temp (24hrs), Avg:99 F (37.2 C), Min:98.2 F (36.8 C), Max:100.8 F (38.2 C)  Recent Labs  Lab 02/25/22 2040 02/25/22 2157 02/25/22 2318 02/26/22 0257 02/26/22 0623  WBC 12.9*  --   --  12.2*  --   CREATININE 1.40*  --   --  1.28* 1.25*  LATICACIDVEN  --  1.1 1.3  --   --      Estimated Creatinine Clearance: 63.1 mL/min (A) (by C-G formula based on SCr of 1.25 mg/dL (H)).    Allergies  Allergen Reactions   Latex Itching   Tape Hives     Adalberto Cole, PharmD, BCPS 02/26/2022 10:16 AM

## 2022-02-26 NOTE — H&P (Signed)
History and Physical    Abigail Moran ERD:[id number] DOB: [birth year] DOA: 02/25/2022  PCP: [name], NP  Patient coming from: [facility] ED  Chief Complaint: Toe pain  HPI: Abigail Moran is a 46 y.o. female with medical history significant of uncontrolled type I diabetes, medical noncompliance, gastroparesis, marijuana abuse, erythropoietin deficiency anemia, right foot cellulitis and abscess in 05/2021, DVT, nephrolithiasis, menorrhagia, CKD stage II-IIIa, vitamin D deficiency, CAD, hypertension, hyperlipidemia.  Recently seen in the ED on 8/7 for right foot infection.  X-ray was negative for osteomyelitis and she was discharged with prescription for doxycycline.  She did not take the antibiotic and also noncompliant with insulin.  Returned to the ED yesterday with toe pain/worsening infection of her right foot.  In the ED, patient noted to be tachycardic and febrile.  Labs showing WBC 12.9, hemoglobin 10.2 (stable), sodium 126, chloride 94, bicarb 13, anion gap 19, glucose 394, creatinine 1.4 (baseline 0.9-1.0), calcium 8.2, albumin 2.9, alkaline phosphatase borderline elevated with normal transaminases and normal T. bili, UA pending, beta hydroxybutyrate acid pending, VBG with pH 7.28, lactic acid normal x2, blood cultures drawn.  Repeat x-ray of right foot negative for osteomyelitis. Patient was given Tylenol, fentanyl, Zofran, vancomycin, cefepime, metronidazole, 1 L normal saline bolus, and started on IV insulin and IV fluids per DKA protocol.  Patient states her right foot is infected.  She peeled the skin between the fourth and fifth toe several days ago after which this infection started.  Reports drainage from this area and pain in her fourth and fifth toes.  She was not aware of having any fevers at home.  States she had gone to the emergency room when her infection initially started and was prescribed an antibiotic but was not able to tolerate it due to nausea and stomach pain.  Also  stopped taking insulin several days ago.  No other complaints.  Denies cough, shortness of breath, or chest pain.  Review of Systems:  Review of Systems  All other systems reviewed and are negative.   Past Medical History:  Diagnosis Date   Allergy    seasonal allergies   Anemia    on meds   Blood transfusion without reported diagnosis 2008   Cellulitis and abscess of foot 03/18/2020   right foot   Diabetes mellitus without complication ([place])    on meds   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   Heart attack ([place]) 05/04/15   History of DVT (deep vein thrombosis)    following hospitalization 2020   History of kidney stones    Menorrhagia with irregular cycle 10/11/2020   Renal disorder    Sepsis due to cellulitis ([place]) 05/15/2021   Vitamin D deficiency 09/14/2014    Past Surgical History:  Procedure Laterality Date   CARDIAC CATHETERIZATION  2016   CHOLECYSTECTOMY  2001   WISDOM TOOTH EXTRACTION       reports that she has never smoked. She has never used smokeless tobacco. She reports that she does not currently use alcohol. She reports that she does not currently use drugs after having used the following drugs: Marijuana. Frequency: 2.00 times per week.  Allergies  Allergen Reactions   Latex Itching   Tape Hives    Family History  Problem Relation Age of Onset   Diabetes Father    Hypertension Father    Hyperlipidemia Father    Liver cancer Maternal Grandfather    Kidney disease Neg Hx    Heart disease Neg Hx    Colon  polyps Neg Hx    Colon cancer Neg Hx    Stomach cancer Neg Hx    Rectal cancer Neg Hx     Prior to Admission medications   Medication Sig Start Date End Date Taking? Authorizing Provider  aspirin EC 81 MG tablet Take 81 mg by mouth daily.    [provider]  atorvastatin (LIPITOR) 10 MG tablet Take 1 tablet (10 mg total) by mouth daily. 06/09/21   [name], NP  blood glucose meter kit and supplies KIT Dispense based on  patient and insurance preference. Use up to four times daily as directed. E11.9 09/28/20   [name], NP  doxycycline (VIBRAMYCIN) 100 MG capsule Take 1 capsule (100 mg total) by mouth 2 (two) times daily. 02/12/22   Curatolo, Adam, DO  glucose blood (FREESTYLE LITE) test strip USE FOUR TIMES DAILY 10/14/20   Philemon Kingdom, MD  insulin aspart (FIASP FLEXTOUCH) 100 UNIT/ML FlexTouch Pen Inject 6-10 Units into the skin 3 (three) times daily between meals. Patient taking differently: Inject 4-10 Units into the skin 3 (three) times daily between meals. 10/14/20   Philemon Kingdom, MD  insulin degludec (TRESIBA FLEXTOUCH) 200 UNIT/ML FlexTouch Pen Inject 20 Units into the skin daily. 05/21/21   Mariel Aloe, MD  Insulin Pen Needle 32G X 4 MM MISC Use 4x a day 10/14/20   Philemon Kingdom, MD  Lancets (FREESTYLE) lancets Use 4x a day 09/28/20   [name], NP  lisinopril (ZESTRIL) 10 MG tablet Take 1 tablet (10 mg total) by mouth daily. 05/26/21   [name], NP  Multiple Vitamin (MULTIVITAMIN) tablet Take 1 tablet by mouth daily.    [provider]  Vitamin D, Ergocalciferol, (DRISDOL) 1.25 MG (50000 UNIT) CAPS capsule Take 1 capsule (50,000 Units total) by mouth every 7 (seven) days. 06/12/21   [name], NP    Physical Exam: Vitals:   02/25/22 2251 02/25/22 2300 02/25/22 2330 02/26/22 0022  BP: (!) 149/100 (!) 150/117 (!) 141/96 93/60  Pulse: (!) 121 (!) 120  (!) 106  Resp: _0 Temp:    98.8 F (37.1 C)  TempSrc:    Oral  SpO2: 98% 100%  100%  Weight:        Physical Exam Vitals reviewed.  Constitutional:      General: She is not in acute distress. HENT:     Head: Normocephalic and atraumatic.  Eyes:     Extraocular Movements: Extraocular movements intact.  Cardiovascular:     Rate and Rhythm: Normal rate and regular rhythm.     Pulses: Normal pulses.  Pulmonary:     Effort: Pulmonary effort is normal. No respiratory distress.      Breath sounds: Normal breath sounds. No wheezing or rales.  Abdominal:     General: Bowel sounds are normal. There is no distension.     Palpations: Abdomen is soft.     Tenderness: There is no abdominal tenderness. There is no guarding.  Musculoskeletal:     Cervical back: Normal range of motion.     Comments: Right foot: Fifth toe swollen with tenderness to palpation.  Skin breakdown with foul-smelling purulent drainage noted between fourth and fifth toe.  Skin:    General: Skin is warm and dry.  Neurological:     General: No focal deficit present.     Mental Status: She is alert and oriented to person, place, and time.  Labs on Admission: I have personally reviewed following labs and imaging studies  CBC: Recent Labs  Lab 02/25/22 2040 02/25/22 2215  WBC 12.9*  --   NEUTROABS 11.1*  --   HGB 10.2* 10.5*  HCT 32.1* 31.0*  MCV 88.2  --   PLT 304  --    Basic Metabolic Panel: Recent Labs  Lab 02/25/22 2040 02/25/22 2215  NA 126* 127*  K 4.2 4.4  CL 94*  --   CO2 13*  --   GLUCOSE 394*  --   BUN 16  --   CREATININE 1.40*  --   CALCIUM 8.2*  --    GFR: Estimated Creatinine Clearance: 55.8 mL/min (A) (by C-G formula based on SCr of 1.4 mg/dL (H)). Liver Function Tests: Recent Labs  Lab 02/25/22 2230  AST 15  ALT 11  ALKPHOS 130*  BILITOT 1.2  PROT 8.3*  ALBUMIN 2.9*   No results for input(s): "LIPASE", "AMYLASE" in the last 168 hours. No results for input(s): "AMMONIA" in the last 168 hours. Coagulation Profile: No results for input(s): "INR", "PROTIME" in the last 168 hours. Cardiac Enzymes: No results for input(s): "CKTOTAL", "CKMB", "CKMBINDEX", "TROPONINI" in the last 168 hours. BNP (last 3 results) No results for input(s): "PROBNP" in the last 8760 hours. HbA1C: No results for input(s): "HGBA1C" in the last 72 hours. CBG: Recent Labs  Lab 02/25/22 2242 02/25/22 2355  GLUCAP 332* 273*   Lipid Profile: No results for input(s):  "CHOL", "HDL", "LDLCALC", "TRIG", "CHOLHDL", "LDLDIRECT" in the last 72 hours. Thyroid Function Tests: No results for input(s): "TSH", "T4TOTAL", "FREET4", "T3FREE", "THYROIDAB" in the last 72 hours. Anemia Panel: No results for input(s): "VITAMINB12", "FOLATE", "FERRITIN", "TIBC", "IRON", "RETICCTPCT" in the last 72 hours. Urine analysis:    Component Value Date/Time   COLORURINE YELLOW 05/14/2021 2254   APPEARANCEUR HAZY (A) 05/14/2021 2254   LABSPEC 1.010 05/14/2021 2254   PHURINE 6.5 05/14/2021 2254   GLUCOSEU >=500 (A) 05/14/2021 2254   GLUCOSEU >=1000 (A) 08/08/2016 0813   HGBUR TRACE (A) 05/14/2021 2254   BILIRUBINUR NEGATIVE 05/14/2021 2254   KETONESUR 15 (A) 05/14/2021 2254   PROTEINUR NEGATIVE 05/14/2021 2254   UROBILINOGEN 0.2 08/08/2016 0813   NITRITE NEGATIVE 05/14/2021 2254   LEUKOCYTESUR NEGATIVE 05/14/2021 2254    Radiological Exams on Admission: I have personally reviewed images DG Foot Complete Right  Result Date: 02/25/2022 CLINICAL DATA:  Right foot pain EXAM: RIGHT FOOT COMPLETE - 3+ VIEW COMPARISON:  02/12/2022 FINDINGS: There is no evidence of fracture or dislocation. There is no evidence of arthropathy or other focal bone abnormality. Soft tissues are unremarkable. IMPRESSION: Negative. Electronically Signed   By: Rolm Baptise M.D.   On: 02/25/2022 21:13    EKG: No EKG done in the ED.  EKG ordered now and currently pending.  Assessment and Plan  DKA, type I Due to insulin noncompliance and infection.  Last A1c 12.6 in September 2022.  Initial labs showing glucose 394, bicarb 13, anion gap 19, VBG with pH 7.28.  UA and beta hydroxybutyrate acid pending. -Continue IV insulin and IV fluids per DKA protocol -Frequent CBG checks -BMP every 4 hours -Repeat A1c  Sepsis secondary to diabetic infection of right foot Patient was prescribed doxycycline during recent ED visit but was not able to tolerate it due to GI symptoms. Meets criteria for sepsis with fever,  tachycardia, and leukocytosis.  No lactic acidosis or hypotension to suggest severe sepsis.  X-ray without signs of osteomyelitis. -Continue  vancomycin, cefepime, and metronidazole. -IV fluid hydration -Pain management -Blood cultures pending -Monitor WBC count -Consult podiatry in a.m.  AKI on CKD stage II-IIIa Likely prerenal from dehydration.  Creatinine 1.4, baseline 0.9-1.0. -IV fluid hydration -Monitor BMP -Avoid nephrotoxic agents/hold lisinopril  Mild hyponatremia Sodium 131 when corrected for hyperglycemia. -Started on LR infusion in the ED.  Repeat BMP and if hyponatremia worsening then switch to normal saline infusion.  Erythropoietin deficiency anemia Hemoglobin stable. -Continue to monitor  CAD Not endorsing any anginal symptoms. -EKG ordered -Hold aspirin until patient is evaluated by podiatry. -Continue statin after pharmacy med rec is done.  Hypertension -Hold lisinopril given AKI/sepsis  Hyperlipidemia -Continue statin after pharmacy med rec is done.  DVT prophylaxis: SCDs Code Status: Full Code Family Communication: No family available at this time. Level of care: Step Down Unit Admission status: It is my clinical opinion that admission to INPATIENT is reasonable and necessary because of the expectation that this patient will require hospital care that crosses at least 2 midnights to treat this condition based on the medical complexity of the problems presented.  Given the aforementioned information, the predictability of an adverse outcome is felt to be significant.   Shela Leff MD [facility]  If 7PM-7AM, please contact night-coverage [email or web address]  02/26/2022, 1:30 AM

## 2022-02-26 NOTE — Inpatient Diabetes Management (Signed)
Inpatient Diabetes Program Recommendations  AACE/ADA: New Consensus Statement on Inpatient Glycemic Control (2015)  Target Ranges:  Prepandial:   less than 140 mg/dL      Peak postprandial:   less than 180 mg/dL (1-2 hours)      Critically ill patients:  140 - 180 mg/dL   Lab Results  Component Value Date   GLUCAP 142 (H) 02/26/2022   HGBA1C 13.1 (H) 02/26/2022    Review of Glycemic Control  Diabetes history: DM1 Outpatient Diabetes medications: Novolin N 12 units BID, Novolin R 5 units TID with meals Current orders for Inpatient glycemic control: IV insulin per [employer] for DKA  HgbA1C - 13.1%  Inpatient Diabetes Program Recommendations:    Not ready for transition - awaiting beta-hydroxybutyric acid results.  When criteria met for discontinuation of drip, give Semglee 1-2 hours prior to discontinuation.  Semglee 10 units BID Novolog 0-9 units TID with meals and 0-5 HS When po intake begins - pt will need meal coverage (would start with 3-4 units TID)  Spoke with pt at bedside regarding her diabetes control and HgbA1C of 13.1%. Pt states she missed several doses of both N and R insulin, d/t not feeling well. Discussed sick day rules and importance of monitoring blood sugars and taking insulin as prescribed. Discussed risks of uncontrolled blood sugars which can lead to both long and short-term complications.   Pt's PCP is now managing her diabetes. Was dismissed from Dr Arman Filter office in December 2022.  Will continue to follow.

## 2022-02-27 ENCOUNTER — Inpatient Hospital Stay (HOSPITAL_COMMUNITY): Payer: Self-pay

## 2022-02-27 DIAGNOSIS — E871 Hypo-osmolality and hyponatremia: Secondary | ICD-10-CM

## 2022-02-27 DIAGNOSIS — E11628 Type 2 diabetes mellitus with other skin complications: Secondary | ICD-10-CM

## 2022-02-27 DIAGNOSIS — M86671 Other chronic osteomyelitis, right ankle and foot: Secondary | ICD-10-CM

## 2022-02-27 DIAGNOSIS — N179 Acute kidney failure, unspecified: Secondary | ICD-10-CM

## 2022-02-27 DIAGNOSIS — L089 Local infection of the skin and subcutaneous tissue, unspecified: Secondary | ICD-10-CM

## 2022-02-27 DIAGNOSIS — M86171 Other acute osteomyelitis, right ankle and foot: Secondary | ICD-10-CM

## 2022-02-27 LAB — BASIC METABOLIC PANEL
Anion gap: 7 (ref 5–15)
BUN: 16 mg/dL (ref 6–20)
CO2: 16 mmol/L — ABNORMAL LOW (ref 22–32)
Calcium: 7.3 mg/dL — ABNORMAL LOW (ref 8.9–10.3)
Chloride: 106 mmol/L (ref 98–111)
Creatinine, Ser: 1.24 mg/dL — ABNORMAL HIGH (ref 0.44–1.00)
GFR, Estimated: 54 mL/min — ABNORMAL LOW (ref >60)
Glucose, Bld: 311 mg/dL — ABNORMAL HIGH (ref 70–99)
Potassium: 4 mmol/L (ref 3.5–5.1)
Sodium: 129 mmol/L — ABNORMAL LOW (ref 135–145)

## 2022-02-27 LAB — GLUCOSE, CAPILLARY
Glucose-Capillary: 256 mg/dL — ABNORMAL HIGH (ref 70–99)
Glucose-Capillary: 288 mg/dL — ABNORMAL HIGH (ref 70–99)
Glucose-Capillary: 245 mg/dL — ABNORMAL HIGH (ref 70–99)
Glucose-Capillary: 233 mg/dL — ABNORMAL HIGH (ref 70–99)
Glucose-Capillary: 319 mg/dL — ABNORMAL HIGH (ref 70–99)

## 2022-02-27 LAB — CBC
HCT: 28 % — ABNORMAL LOW (ref 36.0–46.0)
Hemoglobin: 8.6 g/dL — ABNORMAL LOW (ref 12.0–15.0)
MCH: 28.7 pg (ref 26.0–34.0)
MCHC: 30.7 g/dL (ref 30.0–36.0)
MCV: 93.3 fL (ref 80.0–100.0)
Platelets: 255 10*3/uL (ref 150–400)
RBC: 3 MIL/uL — ABNORMAL LOW (ref 3.87–5.11)
RDW: 14 % (ref 11.5–15.5)
WBC: 10.1 10*3/uL (ref 4.0–10.5)
nRBC: 0 % (ref 0.0–0.2)

## 2022-02-27 LAB — MAGNESIUM: Magnesium: 2.2 mg/dL (ref 1.7–2.4)

## 2022-02-27 MED ORDER — INSULIN GLARGINE-YFGN 100 UNIT/ML ~~LOC~~ SOLN
20.0000 [IU] | Freq: Every day | SUBCUTANEOUS | Status: DC
  Administered 2022-02-27 – 2022-03-01 (×3): 20 [IU] via SUBCUTANEOUS
  Administered 2022-03-02: 10 [IU] via SUBCUTANEOUS
  Administered 2022-03-03 – 2022-03-04 (×2): 20 [IU] via SUBCUTANEOUS
  Administered 2022-03-05: 10 [IU] via SUBCUTANEOUS
  Administered 2022-03-06: 20 [IU] via SUBCUTANEOUS
  Filled 2022-02-27 (×9): qty 0.2

## 2022-02-27 MED ORDER — SODIUM CHLORIDE 0.9 % IV SOLN
INTRAVENOUS | Status: DC
Start: 2022-02-27 — End: 2022-02-28

## 2022-02-27 MED ORDER — HEPARIN SODIUM (PORCINE) 5000 UNIT/ML IJ SOLN
5000.0000 [IU] | Freq: Three times a day (TID) | INTRAMUSCULAR | Status: DC
Start: 2022-02-27 — End: 2022-03-02
  Administered 2022-02-27 – 2022-03-02 (×8): 5000 [IU] via SUBCUTANEOUS
  Filled 2022-02-27 (×8): qty 1

## 2022-02-27 MED ORDER — GADOBUTROL 1 MMOL/ML IV SOLN
9.0000 mL | Freq: Once | INTRAVENOUS | Status: AC | PRN
  Administered 2022-02-27: 9 mL via INTRAVENOUS

## 2022-02-27 NOTE — Consult Note (Signed)
PODIATRY CONSULTATION  NAME Abigail Moran MRN [id number] DOB 28-Sep-1975 DOA 02/25/2022   Reason for consult:  Chief Complaint  Patient presents with   Toe Pain    Consulting physician: Dr. Osvaldo Shipper MD, [facility]  History of present illness: 46 y.o. female PMHx uncontrolled DM 1, medical noncompliance, admitted for DKA and worsening infection of the fourth and fifth toes of the right foot.  MRI ordered.  Podiatry consulted.  Patient evaluated this evening resting comfortably in bed.  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   Heart attack ([facility]) 05/04/15   History of DVT (deep vein thrombosis)    following hospitalization 2020   History of kidney stones    Menorrhagia with irregular cycle 10/11/2020   Renal disorder    Sepsis due to cellulitis ([facility]) 05/15/2021   Vitamin D deficiency 09/14/2014       Latest Ref Rng & Units 02/27/2022    2:47 AM 02/26/2022    2:57 AM 02/25/2022   10:15 PM  CBC  WBC 4.0 - 10.5 K/uL 10.1  12.2    Hemoglobin 12.0 - 15.0 g/dL 8.6  9.9  23.3   Hematocrit 36.0 - 46.0 % 28.0  31.1  31.0   Platelets 150 - 400 K/uL 255  303         Latest Ref Rng & Units 02/27/2022    2:47 AM 02/26/2022    6:23 AM 02/26/2022    2:57 AM  BMP  Glucose 70 - 99 mg/dL 007  622  633   BUN 6 - 20 mg/dL 16  16  16    Creatinine 0.44 - 1.00 mg/dL  3.54  5.62   Sodium 135 - 145 mmol/L 129  132  131   Potassium 3.5 - 5.1 mmol/L 4.0  3.8  4.1   Chloride 98 - 111 mmol/L 106  105  102   CO2 22 - 32 mmol/L 16  17  15    Calcium 8.9 - 10.3 mg/dL 7.3  7.7  7.9       Physical Exam: General: The patient is alert and oriented x3 in no acute distress.   Dermatology: Skin is warm, dry and supple  bilateral lower extremities. Negative for open lesions or macerations.  Vascular: VAS 5.63 ABI W/WO TBI Summary:  Right: Resting right ankle-brachial index is within normal range. The right toe-brachial index is abnormal. Hyperemic waveforms noted in the right posterior tibial and dorsalis pedis  arteries.  Left: Resting left ankle-brachial index is within normal range. The left toe-brachial index is abnormal.   Neurological: Light touch and protective threshold diminished bilaterally.   Musculoskeletal Exam: No structural deformity noted.  No prior amputations  MR FOOT RT W/WO CONTRAST 02/27/2022 IMPRESSION: 1. Ulceration of the fifth toe with extensive cellulitis. Abscess along the dorsolateral aspect of the fifth toe extending proximally to the level of the fifth metatarsal neck measuring approximately 4.2 x 1.1 x 2.5 cm. 2. Acute osteomyelitis throughout the fourth and fifth toes. Mild bone marrow edema within the fourth and fifth metatarsal heads, which may reflect reactive osteitis or early acute osteomyelitis. 3. Small fifth MTP joint effusion, which could be reactive or reflect  septic arthritis.  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  She will require fourth and fifth digit amputations of the right foot.  Patient is amenable to this. -Continue cefepime 2 g every 8 hours as per standing order -After reviewing the patient's chart in detail her ABI are abnormal to the RT foot.  Clinically there is concern for vascular compromise.  Recommend vascular consult.  Transfer to [facility] for vascular consult since vascular services/intervention not available at [facility].  -Surgery was explained in detail with the patient.  Preoperative orders placed.  N.p.o. 9 AM after full breakfast.  Surgery tentatively scheduled for tomorrow, 02/28/2022, pending possible transfer to [facility] and/or vascular  consult/intervention  -Podiatry will follow    Thank you for the consult.  Please feel free to contact me with any questions or concerns.      Felecia Shelling, DPM [facility]  Dr. Felecia Shelling, DPM    [address] [place], [place] [address]                Office [phone]  Fax [phone]

## 2022-02-27 NOTE — Progress Notes (Signed)
Patient transferred to 6e05 in NAD. VSS. Patient oriented to unit and call bell. All needs within reach.

## 2022-02-27 NOTE — Progress Notes (Addendum)
[facility] PROGRESS NOTE   Abigail Moran NWG:[id number] DOB: 05-10-1976 DOA: 02/25/2022  PCP: Sandford Craze, NP  Brief History/Interval Summary: 46 y.o. female with medical history significant of uncontrolled type I diabetes, medical noncompliance, gastroparesis, marijuana abuse, erythropoietin deficiency anemia, right foot cellulitis and abscess in 05/2021, DVT, nephrolithiasis, menorrhagia, CKD stage II-IIIa, vitamin D deficiency, CAD, hypertension, hyperlipidemia.  Recently seen in the ED on 8/7 for right foot infection.  X-ray was negative for osteomyelitis and she was discharged with prescription for doxycycline.  She did not take the antibiotic and also noncompliant with insulin.  Returned to the ED with toe pain/worsening infection of her right foot.  She was found to be in DKA.  She was hospitalized for further management.   Consultants: Podiatry, Dr. Logan Bores.  Procedures: None yet    Subjective/Interval History: Patient is awake alert.  Mentions that she is feeling better this morning.  Was able to tolerate her diet.  No nausea or vomiting.  Pain in the right foot is 2 out of 10 in intensity.     Assessment/Plan:  Diabetic ketoacidosis in the setting of diabetes mellitus type 1 Came in with DKA.  Was started on IV insulin.  After anion gap closed patient was transitioned to subcutaneous insulin. HbA1c 13.1 implying long-term noncompliance.  She mentions that she uses Novolin N and R at home.  Has been compliant but stopped taking her medications a few days prior to this admission due to foot pain with nausea and vomiting.  However her high A1c suggest that there is at least some degree of noncompliance or several weeks to months or perhaps the insulin dose is not adequate for her.  We will increase the dose of glargine to achieve better glycemic control. Diabetes coordinator to continue to educate patient.  Diabetic infection right foot, sepsis present on  admission Patient had tachycardia leukocytosis fever at the time of admission.  Lactic acid was not elevated. X-ray was done of the right foot which did not show any obvious osteomyelitis.  Discussed with podiatrist Dr. Logan Bores.  MRI of the foot suggest abscess and evidence for osteomyelitis.  Podiatry to provide further input today.  Continue with vancomycin cefepime and metronidazole. WBC is normal today at 10.1.  ABIs did not suggest any significant vascular disease.    Acute kidney injury on chronic kidney disease stage IIIa Baseline creatinine 1.0.  Presented with creatinine of 1.4.  Creatinine improved to 1.2.  Continue IV fluids.  Monitor urine output.  Avoid nephrotoxic agents.    Hyponatremia All labs suggest that there is some chronicity to her hyponatremia.  We will change IV fluids to normal saline and recheck labs tomorrow.    Anemia of chronic kidney disease No evidence of overt bleeding.  Drop in hemoglobin is likely dilutional.  No evidence of overt bleeding.    Essential hypertension Holding lisinopril.  Blood patient is reasonably well controlled.  Occasional soft readings.  Coronary artery disease Seems to be stable from a cardiac standpoint.  Hyperlipidemia Resume statin.  Medical noncompliance Plans was emphasized to the patient.  She however mentions that she does take her insulin regularly.  This will need to be an ongoing conversation with her.    Obesity Estimated body mass index is 33.27 kg/m as calculated from the following:   Height as of this encounter: 5' 5.5" (1.664 m).   Weight as of this encounter: 92.1 kg.  DVT Prophylaxis: Subcutaneous heparin  Code Status: Full code Family Communication:  Discussed with the patient Disposition Plan: Hopefully return home when improved  Status is: Inpatient Remains inpatient appropriate because: Need for IV antibiotics, DKA      Medications: Scheduled:  Chlorhexidine Gluconate Cloth  6 each Topical Daily    insulin aspart  0-15 Units Subcutaneous TID WC   insulin aspart  0-5 Units Subcutaneous QHS   insulin glargine-yfgn  15 Units Subcutaneous Daily   Continuous:  sodium chloride     ceFEPime (MAXIPIME) IV Stopped (02/27/22 0429)   metronidazole Stopped (02/26/22 2217)   vancomycin Stopped (02/26/22 2317)   KAJ:[id number] **OR** acetaminophen, dextrose, morphine injection, ondansetron (ZOFRAN) IV, mouth rinse  Antibiotics: Anti-infectives (From admission, onward)    Start     Dose/Rate Route Frequency Ordered Stop   02/26/22 2200  vancomycin (VANCOCIN) IVPB 1000 mg/200 mL premix        1,000 mg 200 mL/hr over 60 Minutes Intravenous Every 24 hours 02/25/22 2120     02/26/22 1200  ceFEPIme (MAXIPIME) 2 g in sodium chloride 0.9 % 100 mL IVPB        2 g 200 mL/hr over 30 Minutes Intravenous Every 8 hours 02/26/22 1049     02/26/22 1200  metroNIDAZOLE (FLAGYL) IVPB 500 mg        500 mg 100 mL/hr over 60 Minutes Intravenous Every 12 hours 02/26/22 1057     02/26/22 1000  ceFEPIme (MAXIPIME) 2 g in sodium chloride 0.9 % 100 mL IVPB  Status:  Discontinued        2 g 200 mL/hr over 30 Minutes Intravenous Every 12 hours 02/25/22 2120 02/26/22 1049   02/25/22 2200  metroNIDAZOLE (FLAGYL) tablet 500 mg  Status:  Discontinued        500 mg Oral Every 12 hours 02/25/22 2056 02/26/22 1057   02/25/22 2200  vancomycin (VANCOCIN) 500 mg in sodium chloride 0.9 % 100 mL IVPB       See Hyperspace for full Linked Orders Report.   500 mg 100 mL/hr over 60 Minutes Intravenous  Once 02/25/22 2102 02/26/22 0605   02/25/22 2115  ceFEPIme (MAXIPIME) 2 g in sodium chloride 0.9 % 100 mL IVPB        2 g 200 mL/hr over 30 Minutes Intravenous  Once 02/25/22 2101 02/25/22 2236   02/25/22 2115  vancomycin (VANCOREADY) IVPB 1750 mg/350 mL  Status:  Discontinued        1,750 mg 175 mL/hr over 120 Minutes Intravenous  Once 02/25/22 2101 02/25/22 2102   02/25/22 2115  vancomycin (VANCOCIN) IVPB 1000 mg/200 mL  premix       See Hyperspace for full Linked Orders Report.   1,000 mg 200 mL/hr over 60 Minutes Intravenous  Once 02/25/22 2102 02/25/22 2330       Objective:  Vital Signs  Vitals:   02/27/22 0300 02/27/22 0400 02/27/22 0500 02/27/22 0800  BP: 105/60 119/70 100/66   Pulse: 96 97 95 (!) 102  Resp: 15 16 17 16   Temp: 98.7 F (37.1 C)     TempSrc: Oral     SpO2: 98% 100% 99% 100%  Weight:      Height:        Intake/Output Summary (Last 24 hours) at 02/27/2022 0903 Last data filed at 02/27/2022 0400 Gross per 24 hour  Intake 2227.27 ml  Output 800 ml  Net 1427.27 ml    Filed Weights   02/25/22 1943 02/26/22 0120  Weight: 90.7 kg 92.1 kg    General appearance: Awake  alert.  In no distress Resp: Clear to auscultation bilaterally.  Normal effort Cardio: S1-S2 is normal regular.  No S3-S4.  No rubs murmurs or bruit GI: Abdomen is soft.  Nontender nondistended.  Bowel sounds are present normal.  No masses organomegaly Extremities: Swelling of the right foot as described yesterday.  No significant changes. Neurologic: Alert and oriented x3.  No focal neurological deficits.     Lab Results:  Data Reviewed: I have personally reviewed following labs and reports of the imaging studies  CBC: Recent Labs  Lab 02/25/22 2040 02/25/22 2215 02/26/22 0257 02/27/22 0247  WBC 12.9*  --  12.2* 10.1  NEUTROABS 11.1*  --   --   --   HGB 10.2* 10.5* 9.9* 8.6*  HCT 32.1* 31.0* 31.1* 28.0*  MCV 88.2  --  88.6 93.3  PLT 304  --  303 255     Basic Metabolic Panel: Recent Labs  Lab 02/25/22 2040 02/25/22 2215 02/26/22 0257 02/26/22 0623 02/27/22 0247  NA 126* 127* 131* 132* 129*  K 4.2 4.4 4.1 3.8 4.0  CL 94*  --  102 105 106  CO2 13*  --  15* 17* 16*  GLUCOSE 394*  --  157* 164* 311*  BUN 16  --  16 16 16   CREATININE 1.40*  --  1.28* 1.25* 1.24*  CALCIUM 8.2*  --  7.9* 7.7* 7.3*  MG  --   --   --   --  2.2     GFR: Estimated Creatinine Clearance: 63.6 mL/min (A)  (by C-G formula based on SCr of 1.24 mg/dL (H)).  Liver Function Tests: Recent Labs  Lab 02/25/22 2230  AST 15  ALT 11  ALKPHOS 130*  BILITOT 1.2  PROT 8.3*  ALBUMIN 2.9*      HbA1C: Recent Labs    02/26/22 0257  HGBA1C 13.1*     CBG: Recent Labs  Lab 02/26/22 1050 02/26/22 1304 02/26/22 1623 02/26/22 2132 02/27/22 0821  GLUCAP 154* 154* 222* 256* 288*      Recent Results (from the past 240 hour(s))  Culture, blood (routine x 2)     Status: None (Preliminary result)   Collection Time: 02/25/22  9:58 PM   Specimen: BLOOD LEFT ARM  Result Value Ref Range Status   Specimen Description   Final    BLOOD LEFT ARM Performed at [facility], [address] [place], [place] [address]    Special Requests   Final    BOTTLES DRAWN AEROBIC AND ANAEROBIC Blood Culture adequate volume Performed at [facility], [address] [place], [place] [address]    Culture   Final    NO GROWTH 1 DAY Performed at [facility], [place] [address] [place], [place] [address]    Report Status PENDING  Incomplete  Culture, blood (routine x 2)     Status: None (Preliminary result)   Collection Time: 02/25/22 10:29 PM   Specimen: BLOOD RIGHT ARM  Result Value Ref Range Status   Specimen Description   Final    BLOOD RIGHT ARM Performed at [facility], [address] [place], [place] [address]    Special Requests   Final    BOTTLES DRAWN AEROBIC AND ANAEROBIC Blood Culture adequate volume Performed at [facility], [address] [place], [place] [address]    Culture   Final    NO GROWTH 1 DAY Performed at [facility], [place] [address], [place], [place] [address]    Report Status PENDING  Incomplete  MRSA Next Gen by PCR, Nasal     Status: None   Collection Time: 02/26/22  1:19 AM   Specimen: Nasal Mucosa; Nasal Swab  Result Value Ref Range Status   MRSA by PCR Next Gen NOT DETECTED NOT DETECTED Final    Comment:  (NOTE) The GeneXpert MRSA Assay (FDA approved for NASAL specimens only), is one component of a comprehensive MRSA colonization surveillance program. It is not intended to diagnose MRSA infection nor to guide or monitor treatment for MRSA infections. Test performance is not FDA approved in patients less than 57 years old. Performed at [facility], [place] [address] [place], [place] [address]       Radiology Studies: MR FOOT RIGHT W WO CONTRAST  Result Date: 02/27/2022 CLINICAL DATA:  Foot swelling, diabetic, osteomyelitis suspected. Fourth and fifth toe infection EXAM: MRI OF THE RIGHT FOREFOOT WITHOUT AND WITH CONTRAST TECHNIQUE: Multiplanar, multisequence MR imaging of the right forefoot was performed before and after the administration of intravenous contrast. CONTRAST:  31mL GADAVIST GADOBUTROL 1 MMOL/ML IV SOLN COMPARISON:  X-ray 02/25/2022, MRI 05/17/2021 FINDINGS: Bones/Joint/Cartilage Bone marrow edema and enhancement of the proximal, middle, and distal phalanx of the fifth toe with confluent low T1 marrow signal changes compatible with acute osteomyelitis. There is mild bone marrow edema within the fifth metatarsal head and neck with preservation of the fatty T1 marrow signal. Small fifth MTP joint effusion, nonspecific Additional findings of acute osteomyelitis involving the proximal, middle, and distal phalanx of the fourth toe. Faint marrow edema within the fourth metatarsal head, likely reactive No additional sites of osteomyelitis are evident within the forefoot. No acute fracture or dislocation. Mild degenerative changes, most notably at the first MTP joint. Ligaments Intact Lisfranc ligament. No evidence of collateral ligament injury. Muscles and Tendons Denervation changes of the foot musculature. Tenosynovitis involving the flexor and extensor tendons of the fifth toe distally. No additional sites of tenosynovitis. Soft tissues Ulceration of the fifth toe with  prominent soft tissue swelling and edema/enhancement. Abscess along the dorsal lateral aspect of the fifth toe extending proximally to the level of the fifth metatarsal neck measuring approximately 4.2 x 1.1 x 2.5 cm. Areas of tissue hypoenhancement of fifth toe and underlying the plantar aspect of the fifth metatarsal head concerning for tissue necrosis. Mild first intermetatarsal space bursitis. Diffuse soft tissue swelling and edema throughout the forefoot. IMPRESSION: 1. Ulceration of the fifth toe with extensive cellulitis. Abscess along the dorsolateral aspect of the fifth toe extending proximally to the level of the fifth metatarsal neck measuring approximately 4.2 x 1.1 x 2.5 cm. 2. Acute osteomyelitis throughout the fourth and fifth toes. Mild bone marrow edema within the fourth and fifth metatarsal heads, which may reflect reactive osteitis or early acute osteomyelitis. 3. Small fifth MTP joint effusion, which could be reactive or reflect septic arthritis. Electronically Signed   By: Davina Poke D.O.   On: 02/27/2022 08:51   VAS [place] ABI WITH/WO TBI  Result Date: 02/27/2022  LOWER EXTREMITY DOPPLER STUDY Patient Name:  Abigail Moran  Date of Exam:   02/26/2022 Medical Rec #: [id number]       Accession #:    [id number] Date of Birth: 23-Sep-1975        Patient Gender: F Patient Age:   84 years Exam Location:  [facility] Procedure:      VAS [place]  ABI WITH/WO TBI Referring Phys: Bonnielee Haff --------------------------------------------------------------------------------  Indications: Ulceration. High Risk Factors: Hyperlipidemia, Diabetes, coronary artery disease.  Comparison Study: No previous exam noted. Performing Technologist: Bobetta Lime BS, RVT  Examination Guidelines: A complete evaluation includes at minimum, Doppler waveform signals and systolic blood pressure reading at the level of bilateral brachial, anterior tibial, and posterior tibial arteries, when vessel segments are  accessible. Bilateral testing is considered an integral part of a complete examination. Photoelectric Plethysmograph (PPG) waveforms and toe systolic pressure readings are included as required and additional duplex testing as needed. Limited examinations for reoccurring indications may be performed as noted.  ABI Findings: +---------+------------------+-----+-----------+-------------------------------+ Right    Rt Pressure (mmHg)IndexWaveform   Comment                         +---------+------------------+-----+-----------+-------------------------------+ Brachial 124                    triphasic                                  +---------+------------------+-----+-----------+-------------------------------+ PTA      142               1.13 multiphasicHyperemic waveform well above                                              the baseline                    +---------+------------------+-----+-----------+-------------------------------+ DP       142               1.13 monophasic Hyperemic waveform well above                                              the baseline, monophasic with                                              preserved upstroke              +---------+------------------+-----+-----------+-------------------------------+ Great Toe75                0.60                                            +---------+------------------+-----+-----------+-------------------------------+ +---------+------------------+-----+-----------+-------+ Left     Lt Pressure (mmHg)IndexWaveform   Comment +---------+------------------+-----+-----------+-------+ Brachial 126                    triphasic          +---------+------------------+-----+-----------+-------+ PTA      136               1.08 multiphasic        +---------+------------------+-----+-----------+-------+ DP       148               1.17 multiphasic         +---------+------------------+-----+-----------+-------+  Great Toe79                0.63                    +---------+------------------+-----+-----------+-------+ +-------+-----------+-----------+------------+------------+ ABI/TBIToday's ABIToday's TBIPrevious ABIPrevious TBI +-------+-----------+-----------+------------+------------+ Right  1.13       0.60                                +-------+-----------+-----------+------------+------------+ Left   1.17       0.63                                +-------+-----------+-----------+------------+------------+  Summary: Right: Resting right ankle-brachial index is within normal range. The right toe-brachial index is abnormal. Hyperemic waveforms noted in the right posterior tibial and dorsalis pedis arteries. Left: Resting left ankle-brachial index is within normal range. The left toe-brachial index is abnormal. *See table(s) above for measurements and observations.  Electronically signed by Orlie Pollen on 02/27/2022 at 4:12:44 AM.    Final    DG Foot Complete Right  Result Date: 02/25/2022 CLINICAL DATA:  Right foot pain EXAM: RIGHT FOOT COMPLETE - 3+ VIEW COMPARISON:  02/12/2022 FINDINGS: There is no evidence of fracture or dislocation. There is no evidence of arthropathy or other focal bone abnormality. Soft tissues are unremarkable. IMPRESSION: Negative. Electronically Signed   By: Rolm Baptise M.D.   On: 02/25/2022 21:13       LOS: 1 day   [facility] Pager on [email or web address]  02/27/2022, 9:03 AM

## 2022-02-28 DIAGNOSIS — E876 Hypokalemia: Secondary | ICD-10-CM

## 2022-02-28 DIAGNOSIS — E11621 Type 2 diabetes mellitus with foot ulcer: Secondary | ICD-10-CM

## 2022-02-28 DIAGNOSIS — N1831 Chronic kidney disease, stage 3a: Secondary | ICD-10-CM

## 2022-02-28 LAB — CBC
HCT: 25.4 % — ABNORMAL LOW (ref 36.0–46.0)
Hemoglobin: 8.1 g/dL — ABNORMAL LOW (ref 12.0–15.0)
MCH: 28.2 pg (ref 26.0–34.0)
MCHC: 31.9 g/dL (ref 30.0–36.0)
MCV: 88.5 fL (ref 80.0–100.0)
Platelets: 280 10*3/uL (ref 150–400)
RBC: 2.87 MIL/uL — ABNORMAL LOW (ref 3.87–5.11)
RDW: 14 % (ref 11.5–15.5)
WBC: 8.4 10*3/uL (ref 4.0–10.5)
nRBC: 0 % (ref 0.0–0.2)

## 2022-02-28 LAB — BASIC METABOLIC PANEL
Anion gap: 7 (ref 5–15)
BUN: 14 mg/dL (ref 6–20)
CO2: 20 mmol/L — ABNORMAL LOW (ref 22–32)
Calcium: 7.5 mg/dL — ABNORMAL LOW (ref 8.9–10.3)
Chloride: 106 mmol/L (ref 98–111)
Creatinine, Ser: 1.03 mg/dL — ABNORMAL HIGH (ref 0.44–1.00)
GFR, Estimated: >60 mL/min (ref >60)
Glucose, Bld: 292 mg/dL — ABNORMAL HIGH (ref 70–99)
Potassium: 3.4 mmol/L — ABNORMAL LOW (ref 3.5–5.1)
Sodium: 133 mmol/L — ABNORMAL LOW (ref 135–145)

## 2022-02-28 LAB — GLUCOSE, CAPILLARY
Glucose-Capillary: 254 mg/dL — ABNORMAL HIGH (ref 70–99)
Glucose-Capillary: 153 mg/dL — ABNORMAL HIGH (ref 70–99)
Glucose-Capillary: 136 mg/dL — ABNORMAL HIGH (ref 70–99)
Glucose-Capillary: 134 mg/dL — ABNORMAL HIGH (ref 70–99)

## 2022-02-28 MED ORDER — INSULIN ASPART 100 UNIT/ML IJ SOLN
4.0000 [IU] | Freq: Three times a day (TID) | INTRAMUSCULAR | Status: DC
  Administered 2022-02-28 – 2022-03-06 (×7): 4 [IU] via SUBCUTANEOUS

## 2022-02-28 MED ORDER — SODIUM CHLORIDE 0.9 % IV SOLN: INTRAVENOUS | Status: AC

## 2022-02-28 MED ORDER — POTASSIUM CHLORIDE CRYS ER 20 MEQ PO TBCR
40.0000 meq | EXTENDED_RELEASE_TABLET | Freq: Once | ORAL | Status: AC
  Administered 2022-02-28: 40 meq via ORAL
  Filled 2022-02-28: qty 2

## 2022-02-28 MED ORDER — VANCOMYCIN HCL 1250 MG/250ML IV SOLN
1250.0000 mg | Freq: Every day | INTRAVENOUS | Status: DC
  Administered 2022-02-28 – 2022-03-04 (×4): 1250 mg via INTRAVENOUS
  Filled 2022-02-28 (×7): qty 250

## 2022-02-28 NOTE — Progress Notes (Signed)
PROGRESS NOTE   Abigail Moran  UVO:[id number]    DOB: 10/26/1975    DOA: 02/25/2022  PCP: Sandford Craze, NP   I have briefly reviewed patients previous medical records in  [facility].  Chief Complaint  Patient presents with   Toe Pain    Brief Narrative:  46 y.o. female with medical history significant of uncontrolled type I diabetes, medical noncompliance, gastroparesis, marijuana abuse, erythropoietin deficiency anemia, right foot cellulitis and abscess in 05/2021, DVT, nephrolithiasis, menorrhagia, CKD stage II-IIIa, vitamin D deficiency, CAD, hypertension, hyperlipidemia.  Recently seen in the ED on 8/7 for right foot infection.  X-ray was negative for osteomyelitis and she was discharged with prescription for doxycycline.  She did not take the antibiotic and also noncompliant with insulin.  Returned to the ED with toe pain/worsening infection of her right foot.  She was found to be in DKA.  She was hospitalized for further management.  Podiatry consulted.  Recommend transferring to [facility] for vascular surgery consultation and intervention prior to right fourth and fifth digit amputations.  Consulted vascular surgery.  Transferring to [facility].     Assessment & Plan:  Principal Problem:   DKA, type 1 (HCC) Active Problems:   CAD (coronary artery disease)   Erythropoietin deficiency anemia   Hyperlipidemia   Hyponatremia   Sepsis (HCC)   Primary hypertension   Diabetic foot infection (HCC)   Acute-on-chronic kidney injury (HCC)   DKA (diabetic ketoacidosis) (HCC)   Diabetic ketoacidosis in the setting of poorly controlled diabetes mellitus type 1 Came in with DKA.  Was started on IV insulin.  After anion gap closed patient was transitioned to subcutaneous insulin. HbA1c 13.1 implying long-term noncompliance.  She mentions that she uses Novolin N and R at home.  Has been compliant but stopped taking her medications a few days prior to this admission due to foot pain with nausea  and vomiting.  However her high A1c suggest that there is at least some degree of noncompliance or several weeks to months or perhaps the insulin dose is not adequate for her.  Transitioned to [facility] 20 units daily and NovoLog moderate SSI with bedtime scale.  CBGs still run in the 233-319 range.  Added mealtime NovoLog 4 units 3 times daily.   Diabetic infection right foot/osteomyelitis with abscess right fourth and fifth digits, sepsis present on admission Patient had tachycardia leukocytosis fever at the time of admission.  Lactic acid was not elevated. X-ray was done of the right foot which did not show any obvious osteomyelitis.  MRI of the foot suggest abscess and evidence for osteomyelitis.  Continue with vancomycin cefepime and metronidazole. WBC is normal today at 10.1.  ABIs showed bilateral abnormal toe brachial indices.  Dr. Logan Bores, podiatry input appreciated.  Discussed with him this morning and he recommended transferring to [facility] for vascular surgery consultation and amputations of right fourth and fifth digits.  Consulted [facility]/Dr. Myra Gianotti who will see patient when she gets to [facility] and suggest likely angiogram 8/24 prior to amputations.   Acute kidney injury on chronic kidney disease stage IIIa Baseline creatinine 1.0.  Presented with creatinine of 1.4.  Treated with IV fluids.  Creatinine down to 1.03, AKI resolved.  Continue gentle IV fluids for additional 24 hours, more so with plans for angiogram in a.m.  Will need to monitor BMP closely.  Avoid nephrotoxic's.  Hypokalemia Replace and follow.  Magnesium 2.2.  Hyponatremia All labs suggest that there is some chronicity to her hyponatremia.  Much  of this currently is pseudohyponatremia from hyperglycemia and serum sodium corrects to normal.   Anemia of chronic kidney disease No evidence of overt bleeding.  Drop in hemoglobin is likely dilutional.  No evidence of overt bleeding.  Hemoglobin stable in the 8 g range.  Follow CBC closely  and transfuse if hemoglobin 7 g or less.   Essential hypertension Holding lisinopril.  Blood patient is reasonably well controlled.  Occasional soft readings.   Coronary artery disease Seems to be stable from a cardiac standpoint.   Hyperlipidemia Resume statin.   Medical noncompliance Plans was emphasized to the patient.  She however mentions that she does take her insulin regularly.  This will need to be an ongoing conversation with her.     Body mass index is 33.27 kg/m./Obesity    DVT prophylaxis: heparin injection 5,000 Units Start: 02/27/22 1400 SCDs Start: 02/26/22 7026     Code Status: Full Code:  Family Communication: None at bedside  Disposition:  Status is: Inpatient Remains inpatient appropriate because: Need for surgical intervention, vascular surgery evaluation and intervention.     Consultants:   Podiatry/Dr. Logan Bores Vascular surgery/Dr. Myra Gianotti  Procedures:     Antimicrobials:   As above.   Subjective:  Seen this morning at [facility] prior to transfer to [facility].  Reports feeling "scared" for the upcoming procedures.  Reassured.  Denies complaints or pain.  States that she has been diabetic for 20+ years.  Objective:   Vitals:   02/27/22 1717 02/27/22 2102 02/28/22 0126 02/28/22 0605  BP: (!) 136/92 (!) 145/84 126/80 113/76  Pulse: 98 (!) 109 (!) 108 97  Resp: 16 20 18 20   Temp: 98.4 F (36.9 C) 99.4 F (37.4 C) 98.7 F (37.1 C) 98.3 F (36.8 C)  TempSrc: Oral Oral Oral Oral  SpO2: 100% 99% 97% 100%  Weight:      Height:        General exam: Young female, moderately built and obese lying comfortably propped up in bed without distress.  Oral mucosa moist. Respiratory system: Clear to auscultation. Respiratory effort normal. Cardiovascular system: S1 & S2 heard, RRR. No JVD, murmurs, rubs, gallops or clicks. No pedal edema.  Not on telemetry. Gastrointestinal system: Abdomen is nondistended, soft and nontender. No organomegaly or  masses felt. Normal bowel sounds heard. Central nervous system: Alert and oriented. No focal neurological deficits. Extremities: Symmetric 5 x 5 power. Skin: No rashes, lesions or ulcers.  Right foot dressing clean and dry, did not remove to examine today.  This had been seen by podiatry last evening. Psychiatry: Judgement and insight appear normal. Mood & affect appropriate.     Data Reviewed:   I have personally reviewed following labs and imaging studies   CBC: Recent Labs  Lab 02/25/22 2040 02/25/22 2215 02/26/22 0257 02/27/22 0247 02/28/22 0712  WBC 12.9*  --  12.2* 10.1 8.4  NEUTROABS 11.1*  --   --   --   --   HGB 10.2*   < > 9.9* 8.6* 8.1*  HCT 32.1*   < > 31.1* 28.0* 25.4*  MCV 88.2  --  88.6 93.3 88.5  PLT 304  --  303 255 280   < > = values in this interval not displayed.    Basic Metabolic Panel: Recent Labs  Lab 02/25/22 2040 02/25/22 2215 02/26/22 0257 02/26/22 0623 02/27/22 0247 02/28/22 0712  NA 126* 127* 131* 132* 129* 133*  K 4.2 4.4 4.1 3.8 4.0 3.4*  CL 94*  --  102 105 106 106  CO2 13*  --  15* 17* 16* 20*  GLUCOSE 394*  --  157* 164* 311* 292*  BUN 16  --  16 16 16 14   CREATININE 1.40*  --  1.28* 1.25* 1.24* 1.03*  CALCIUM 8.2*  --  7.9* 7.7* 7.3* 7.5*  MG  --   --   --   --  2.2  --     Liver Function Tests: Recent Labs  Lab 02/25/22 2230  AST 15  ALT 11  ALKPHOS 130*  BILITOT 1.2  PROT 8.3*  ALBUMIN 2.9*    CBG: Recent Labs  Lab 02/27/22 1735 02/27/22 2222 02/28/22 0740  GLUCAP 233* 319* 254*    Microbiology Studies:   Recent Results (from the past 240 hour(s))  Culture, blood (routine x 2)     Status: None (Preliminary result)   Collection Time: 02/25/22  9:58 PM   Specimen: BLOOD LEFT ARM  Result Value Ref Range Status   Specimen Description   Final    BLOOD LEFT ARM Performed at [facility], [address] [place], [place] [address]    Special Requests   Final    BOTTLES DRAWN AEROBIC AND  ANAEROBIC Blood Culture adequate volume Performed at [facility], [address] [place], [place] [address]    Culture   Final    NO GROWTH 2 DAYS Performed at [facility], [place] [address], [place], [place] [address]    Report Status PENDING  Incomplete  Culture, blood (routine x 2)     Status: None (Preliminary result)   Collection Time: 02/25/22 10:29 PM   Specimen: BLOOD RIGHT ARM  Result Value Ref Range Status   Specimen Description   Final    BLOOD RIGHT ARM Performed at [facility], [address] [place], [place] [address]    Special Requests   Final    BOTTLES DRAWN AEROBIC AND ANAEROBIC Blood Culture adequate volume Performed at [facility], [address] [place], [place] [address]    Culture   Final    NO GROWTH 2 DAYS Performed at [facility], [place] [address] [place], [place] [address]    Report Status PENDING  Incomplete  MRSA Next Gen by PCR, Nasal     Status: None   Collection Time: 02/26/22  1:19 AM   Specimen: Nasal Mucosa; Nasal Swab  Result Value Ref Range Status   MRSA by PCR Next Gen NOT DETECTED NOT DETECTED Final    Comment: (NOTE) The GeneXpert MRSA Assay (FDA approved for NASAL specimens only), is one component of a comprehensive MRSA colonization surveillance program. It is not intended to diagnose MRSA infection nor to guide or monitor treatment for MRSA infections. Test performance is not FDA approved in patients less than 47 years old. Performed at [facility], [place] [address], [place], [place] [address]     Radiology Studies:  MR FOOT RIGHT W WO CONTRAST  Result Date: 02/27/2022 CLINICAL DATA:  Foot swelling, diabetic, osteomyelitis suspected. Fourth and fifth toe infection EXAM: MRI OF THE RIGHT FOREFOOT WITHOUT AND WITH CONTRAST TECHNIQUE: Multiplanar, multisequence MR imaging of the right forefoot was performed before and after the administration of intravenous contrast.  CONTRAST:  27mL GADAVIST GADOBUTROL 1 MMOL/ML IV SOLN COMPARISON:  X-ray 02/25/2022, MRI 05/17/2021 FINDINGS: Bones/Joint/Cartilage Bone marrow edema and enhancement of the proximal, middle, and distal phalanx of the  fifth toe with confluent low T1 marrow signal changes compatible with acute osteomyelitis. There is mild bone marrow edema within the fifth metatarsal head and neck with preservation of the fatty T1 marrow signal. Small fifth MTP joint effusion, nonspecific Additional findings of acute osteomyelitis involving the proximal, middle, and distal phalanx of the fourth toe. Faint marrow edema within the fourth metatarsal head, likely reactive No additional sites of osteomyelitis are evident within the forefoot. No acute fracture or dislocation. Mild degenerative changes, most notably at the first MTP joint. Ligaments Intact Lisfranc ligament. No evidence of collateral ligament injury. Muscles and Tendons Denervation changes of the foot musculature. Tenosynovitis involving the flexor and extensor tendons of the fifth toe distally. No additional sites of tenosynovitis. Soft tissues Ulceration of the fifth toe with prominent soft tissue swelling and edema/enhancement. Abscess along the dorsal lateral aspect of the fifth toe extending proximally to the level of the fifth metatarsal neck measuring approximately 4.2 x 1.1 x 2.5 cm. Areas of tissue hypoenhancement of fifth toe and underlying the plantar aspect of the fifth metatarsal head concerning for tissue necrosis. Mild first intermetatarsal space bursitis. Diffuse soft tissue swelling and edema throughout the forefoot. IMPRESSION: 1. Ulceration of the fifth toe with extensive cellulitis. Abscess along the dorsolateral aspect of the fifth toe extending proximally to the level of the fifth metatarsal neck measuring approximately 4.2 x 1.1 x 2.5 cm. 2. Acute osteomyelitis throughout the fourth and fifth toes. Mild bone marrow edema within the fourth and fifth  metatarsal heads, which may reflect reactive osteitis or early acute osteomyelitis. 3. Small fifth MTP joint effusion, which could be reactive or reflect septic arthritis. Electronically Signed   By: Davina Poke D.O.   On: 02/27/2022 08:51   VAS [place] ABI WITH/WO TBI  Result Date: 02/27/2022  LOWER EXTREMITY DOPPLER STUDY Patient Name:  Abigail Moran  Date of Exam:   02/26/2022 Medical Rec #: [id number]       Accession #:    [id number] Date of Birth: 07/26/1975        Patient Gender: F Patient Age:   36 years Exam Location[facility]  [facility] Procedure:      VAS [place] ABI WITH/WO TBI Referring Phys: Bonnielee Haff --------------------------------------------------------------------------------  Indications: Ulceration. High Risk Factors: Hyperlipidemia, Diabetes, coronary artery disease.  Comparison Study: No previous exam noted. Performing Technologist: Bobetta Lime BS, RVT  Examination Guidelines: A complete evaluation includes at minimum, Doppler waveform signals and systolic blood pressure reading at the level of bilateral brachial, anterior tibial, and posterior tibial arteries, when vessel segments are accessible. Bilateral testing is considered an integral part of a complete examination. Photoelectric Plethysmograph (PPG) waveforms and toe systolic pressure readings are included as required and additional duplex testing as needed. Limited examinations for reoccurring indications may be performed as noted.  ABI Findings: +---------+------------------+-----+-----------+-------------------------------+ Right    Rt Pressure (mmHg)IndexWaveform   Comment                         +---------+------------------+-----+-----------+-------------------------------+ Brachial 124                    triphasic                                  +---------+------------------+-----+-----------+-------------------------------+ PTA      142               1.13  multiphasicHyperemic waveform well above                                               the baseline                    +---------+------------------+-----+-----------+-------------------------------+ DP       142               1.13 monophasic Hyperemic waveform well above                                              the baseline, monophasic with                                              preserved upstroke              +---------+------------------+-----+-----------+-------------------------------+ Great Toe75                0.60                                            +---------+------------------+-----+-----------+-------------------------------+ +---------+------------------+-----+-----------+-------+ Left     Lt Pressure (mmHg)IndexWaveform   Comment +---------+------------------+-----+-----------+-------+ Brachial 126                    triphasic          +---------+------------------+-----+-----------+-------+ PTA      136               1.08 multiphasic        +---------+------------------+-----+-----------+-------+ DP       148               1.17 multiphasic        +---------+------------------+-----+-----------+-------+ Great Toe79                0.63                    +---------+------------------+-----+-----------+-------+ +-------+-----------+-----------+------------+------------+ ABI/TBIToday's ABIToday's TBIPrevious ABIPrevious TBI +-------+-----------+-----------+------------+------------+ Right  1.13       0.60                                +-------+-----------+-----------+------------+------------+ Left   1.17       0.63                                +-------+-----------+-----------+------------+------------+  Summary: Right: Resting right ankle-brachial index is within normal range. The right toe-brachial index is abnormal. Hyperemic waveforms noted in the right posterior tibial and dorsalis pedis arteries. Left: Resting left ankle-brachial index is within normal  range. The left toe-brachial index is abnormal. *See table(s) above for measurements and observations.  Electronically signed by Orlie Pollen on 02/27/2022 at 4:12:44 AM.    Final     Scheduled Meds:    Chlorhexidine Gluconate Cloth  6 each Topical Daily   heparin injection (subcutaneous)  5,000 Units Subcutaneous Q8H   insulin aspart  0-15 Units Subcutaneous TID WC   insulin aspart  0-5 Units Subcutaneous QHS   insulin glargine-yfgn  20 Units Subcutaneous Daily    Continuous Infusions:    sodium chloride 100 mL/hr at 02/28/22 0505   ceFEPime (MAXIPIME) IV 2 g (02/28/22 0506)   metronidazole 500 mg (02/28/22 [id number])   vancomycin Stopped (02/27/22 2255)     LOS: 2 days     Vernell Leep, MD,  FACP,  [facility], [facility], [facility] (Care Management Physician Certified) [place]  To contact the attending provider between 7A-7P or the covering provider during after hours 7P-7A, please log into the web site [email or web address] and access using universal [place] password for that web site. If you do not have the password, please call the hospital operator.  02/28/2022, 9:29 AM

## 2022-02-28 NOTE — Consult Note (Signed)
Vascular and Vein Specialist of [facility]  Patient name: Abigail Moran MRN: [id number] DOB: 1975-12-21 Sex: female   REQUESTING PROVIDER:   Hospital service   REASON FOR CONSULT:    Right diabetic foot ulcer  HISTORY OF PRESENT ILLNESS:   Abigail Moran is a 46 y.o. female, who presented to the emergency department on 02/25/2022 with right fifth toe pain which she says been going on for 3 weeks.  She had previously been seen on 8 7 in the emergency department for right foot infection.  At that time x-ray was negative for osteomyelitis and she was discharged home with antibiotics.  She has not taken the antibiotics.  She is a type I diabetic who has not been very compliant.  On arrival to the ER she was tachycardic and febrile with a white count of 13,000.  She has been seen by podiatry who is planning on amputation of the fourth and fifth toes however she had abnormal vascular lab studies and so we were consulted.  The patient has a history of a DVT following hospitalization in 2020.  She had a slight increase in her creatinine, however this is normalized.  She does have a history of myocardial infarction.  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   Heart attack (HCC) 05/04/15   History of DVT (deep vein thrombosis)    following hospitalization 2020   History of kidney stones    Menorrhagia with irregular cycle 10/11/2020   Renal disorder    Sepsis due to cellulitis (HCC) 05/15/2021   Vitamin D deficiency 09/14/2014     FAMILY HISTORY   Family History  Problem Relation Age of Onset   Diabetes Father    Hypertension Father     Hyperlipidemia Father    Liver cancer Maternal Grandfather    Kidney disease Neg Hx    Heart disease Neg Hx    Colon polyps Neg Hx    Colon cancer Neg Hx    Stomach cancer Neg Hx    Rectal cancer Neg Hx     SOCIAL HISTORY:   Social History   Socioeconomic History   Marital status: Single    Spouse name: Not on file   Number of children: Not on file   Years of education: Not on file   Highest education level: Not on file  Occupational History   Not on file  Tobacco Use   Smoking status: Never   Smokeless tobacco: Never  Vaping Use   Vaping Use: Never used  Substance and Sexual Activity   Alcohol use: Not Currently    Comment: 3 x per month   Drug use: Not Currently    Frequency: 2.0 times per week    Types: Marijuana   Sexual activity: Not Currently  Other Topics Concern   Not on file  Social History Narrative   Works as Social worker    Lives with common law husband   No children   Hair school   Grew up in [place]   Social Determinants of Health   Financial Resource Strain: Not on file  Food Insecurity: Not on file  Transportation Needs: Not on file  Physical Activity: Not on file  Stress: Not on file  Social Connections: Not on file  Intimate Partner Violence: Not on file    ALLERGIES:    Allergies  Allergen Reactions   Latex Itching    Tears skin   Tape Hives    CURRENT MEDICATIONS:    Current Facility-Administered Medications  Medication Dose Route Frequency Provider Last Rate Last Admin   0.9 %  sodium chloride infusion   Intravenous Continuous Elease Etienne, MD 50 mL/hr at 02/28/22 1026 New Bag at 02/28/22 1026   acetaminophen (TYLENOL) tablet 650 mg  650 mg Oral Q6H PRN John Giovanni, MD   650 mg at 02/27/22 1552   Or   acetaminophen (TYLENOL) suppository 650 mg  650 mg Rectal Q6H PRN John Giovanni, MD       ceFEPIme (MAXIPIME) 2 g in sodium chloride 0.9 % 100 mL IVPB  2 g Intravenous Q8H Osvaldo Shipper, MD 200 mL/hr at  02/28/22 1243 2 g at 02/28/22 1243   Chlorhexidine Gluconate Cloth 2 % PADS 6 each  6 each Topical Daily John Giovanni, MD   6 each at 02/28/22 1027   dextrose 50 % solution 0-50 mL  0-50 mL Intravenous PRN John Giovanni, MD       heparin injection 5,000 Units  5,000 Units Subcutaneous Q8H Osvaldo Shipper, MD   5,000 Units at 02/28/22 1715   insulin aspart (novoLOG) injection 0-15 Units  0-15 Units Subcutaneous TID WC Osvaldo Shipper, MD   2 Units at 02/28/22 1717   insulin aspart (novoLOG) injection 0-5 Units  0-5 Units Subcutaneous QHS Osvaldo Shipper, MD   4 Units at 02/27/22 2236   insulin aspart (novoLOG) injection 4 Units  4 Units Subcutaneous TID WC Elease Etienne, MD   4 Units at 02/28/22 1717   insulin glargine-yfgn (SEMGLEE) injection 20 Units  20 Units Subcutaneous Daily Osvaldo Shipper, MD   20 Units at 02/28/22 0815   metroNIDAZOLE (FLAGYL) IVPB 500 mg  500 mg Intravenous Q12H Osvaldo Shipper, MD 100 mL/hr at 02/28/22 0812 500 mg at 02/28/22 7322   morphine (PF) 2 MG/ML injection 1 mg  1 mg Intravenous Q4H PRN John Giovanni, MD   1 mg at 02/28/22 1720   ondansetron (ZOFRAN) injection 4 mg  4 mg Intravenous Q6H PRN Osvaldo Shipper, MD       Oral care mouth rinse  15 mL Mouth Rinse PRN John Giovanni, MD       vancomycin (VANCOREADY) IVPB 1250 mg/250 mL  1,250 mg Intravenous Daily Bauswell, Hannah, Student-PharmD 166.7 mL/hr at 02/28/22 1710 1,250 mg at 02/28/22 1710    REVIEW OF SYSTEMS:   [X]  denotes positive finding, [ ]  denotes negative finding Cardiac  Comments:  Chest pain or chest pressure:    Shortness of breath upon exertion:    Short of breath when lying flat:    Irregular heart rhythm:        Vascular    Pain in calf, thigh, or hip brought on by ambulation:    Pain in feet at night that wakes you up from your sleep:     Blood clot in your veins:    Leg swelling:         Pulmonary    Oxygen at home:    Productive cough:     Wheezing:          Neurologic    Sudden weakness in arms or legs:  Sudden numbness in arms or legs:     Sudden onset of difficulty speaking or slurred speech:    Temporary loss of vision in one eye:     Problems with dizziness:         Gastrointestinal    Blood in stool:      Vomited blood:         Genitourinary    Burning when urinating:     Blood in urine:        Psychiatric    Major depression:         Hematologic    Bleeding problems:    Problems with blood clotting too easily:        Skin    Rashes or ulcers: x       Constitutional    Fever or chills:     PHYSICAL EXAM:   Vitals:   02/28/22 0126 02/28/22 0605 02/28/22 1333 02/28/22 1944  BP: 126/80 113/76 121/82 115/88  Pulse: (!) 108 97 89 (!) 101  Resp: 18 20 18 18   Temp: 98.7 F (37.1 C) 98.3 F (36.8 C) 98.1 F (36.7 C) 99 F (37.2 C)  TempSrc: Oral Oral Oral Oral  SpO2: 97% 100% 100% 98%  Weight:      Height:        GENERAL: The patient is a well-nourished female, in no acute distress. The vital signs are documented above. CARDIAC: There is a regular rate and rhythm.  VASCULAR: Palpable femoral pulses PULMONARY: Nonlabored respirations ABDOMEN: Soft and non-tender with normal pitched bowel sounds.  MUSCULOSKELETAL: There are no major deformities or cyanosis. NEUROLOGIC: No focal weakness or paresthesias are detected. SKIN: See photo below PSYCHIATRIC: The patient has a normal affect.     STUDIES:   I have reviewed her vascular studies with the following findings: +-------+-----------+-----------+------------+------------+  ABI/TBIToday's ABIToday's TBIPrevious ABIPrevious TBI  +-------+-----------+-----------+------------+------------+  Right  1.13       0.60                                 +-------+-----------+-----------+------------+------------+  Left   1.17       0.63                                 +-------+-----------+-----------+------------+------------+ Right toe pressure:  75 Left toe pressure: 79 Left waveforms are multiphasic Right DP waveform is monophasic.  Posterior tibial is multiphasic ASSESSMENT and PLAN   Right diabetic foot infection: The patient is scheduled for right fourth and fifth toe amputation secondary to gangrene.  Vascular lab studies indicate suboptimal toe pressures.  This potentially could be related to tibial disease but she also likely has small vessel disease on her foot given her history of poorly controlled diabetes.  I discussed with the patient that she needs to undergo angiography to define her anatomy and to intervene if indicated.  I discussed the risk the procedure including embolization and bleeding.  All questions were answered.  This will be done tomorrow through a left femoral approach.  All questions were answered.  She will be n.p.o. after midnight.  She will need to be transferred to [facility] for her procedure.   [facility], MD, FACS Vascular and Vein Specialists of [facility] [phone] Pager [phone]

## 2022-02-28 NOTE — Inpatient Diabetes Management (Signed)
Inpatient Diabetes Program Recommendations  AACE/ADA: New Consensus Statement on Inpatient Glycemic Control (2015)  Target Ranges:  Prepandial:   less than 140 mg/dL      Peak postprandial:   less than 180 mg/dL (1-2 hours)      Critically ill patients:  140 - 180 mg/dL   Lab Results  Component Value Date   GLUCAP 153 (H) 02/28/2022   HGBA1C 13.1 (H) 02/26/2022    Review of Glycemic Control  Diabetes history: DM1 Outpatient Diabetes medications: Novolin N 20 BID, Novolin R 5-8 TID Current orders for Inpatient glycemic control: Semglee 20 QD, Novolog 0-15 TID with meals and 0-5 HS + 4 units TID  HgbA1C - 13.1%  Inpatient Diabetes Program Recommendations:    Consider increasing Semglee to 30 units QD (on Novolin N 20 BID at home)  Decrease Novolog to 0-9 units TID with meals and 0-5 HS   Spoke again with pt at bedside. Will be transferred to [facility] this afternoon for vascular consult and amputation. Pt states she wishes she would have taken her insulin as prescribed, but put everything else priority rather than taking care of her diabetes. Empathetic listening and gave support.  Follow after surgery.  Thank you. Ailene Ards, RD, LDN, CDCES Inpatient Diabetes Coordinator [phone]

## 2022-02-28 NOTE — Progress Notes (Signed)
Pharmacy Antibiotic Note  Abigail Moran is a 46 y.o. female admitted on 02/25/2022 presenting with worsening toe infection with drainage, concern for diabetic foot infection.  Pharmacy has been consulted for vancomycin and cefepime dosing.  Scr has trended down, warranting a vancomycin dose increase  Today, 02/28/2022:  - day 3 of cefepime, vancomycin, Flagyl - afebrile - WBC WNL - SCr 1.03, improved to baseline   Plan: - Increase vancomycin dose to vancomycin 1250 mg IV q24h (eAUC 495, SCr 1.03) -Continue cefepime 2 g q8h -Continue Flagyl per MD - Expected transfer to [facility] today and amputation tomorrow - Monitor clinical progression - Monitor WBC, temp, SCr  Height: 5' 5.5" (166.4 cm) Weight: 92.1 kg (203 lb 0.7 oz) IBW/kg (Calculated) : 58.15  Temp (24hrs), Avg:98.6 F (37 C), Min:98.1 F (36.7 C), Max:99.4 F (37.4 C)  Recent Labs  Lab 02/25/22 2040 02/25/22 2157 02/25/22 2318 02/26/22 0257 02/26/22 0623 02/27/22 0247 02/28/22 0712  WBC 12.9*  --   --  12.2*  --  10.1 8.4  CREATININE 1.40*  --   --  1.28* 1.25* 1.24* 1.03*  LATICACIDVEN  --  1.1 1.3  --   --   --   --     Estimated Creatinine Clearance: 76.5 mL/min (A) (by C-G formula based on SCr of 1.03 mg/dL (H)).    Allergies  Allergen Reactions   Latex Itching    Tears skin   Tape Hives   Microbiology results: 8/20 BCx: NGTD 8/21 MRSA PCR: negative  Thank you for allowing pharmacy to be a part of this patient's care.  Velda Shell, PharmD Candidate 02/28/2022 3:26 PM

## 2022-02-28 NOTE — Plan of Care (Signed)

## 2022-03-01 ENCOUNTER — Inpatient Hospital Stay (HOSPITAL_COMMUNITY)
Admission: EM | Disposition: A | Discharge status: Home / Self Care | Payer: Self-pay | Source: Home / Self Care | Attending: Internal Medicine

## 2022-03-01 DIAGNOSIS — E1110 Type 2 diabetes mellitus with ketoacidosis without coma: Secondary | ICD-10-CM

## 2022-03-01 DIAGNOSIS — I25119 Atherosclerotic heart disease of native coronary artery with unspecified angina pectoris: Secondary | ICD-10-CM

## 2022-03-01 DIAGNOSIS — R6520 Severe sepsis without septic shock: Secondary | ICD-10-CM

## 2022-03-01 DIAGNOSIS — D631 Anemia in chronic kidney disease: Secondary | ICD-10-CM

## 2022-03-01 DIAGNOSIS — I10 Essential (primary) hypertension: Secondary | ICD-10-CM

## 2022-03-01 DIAGNOSIS — E785 Hyperlipidemia, unspecified: Secondary | ICD-10-CM

## 2022-03-01 DIAGNOSIS — A419 Sepsis, unspecified organism: Secondary | ICD-10-CM

## 2022-03-01 HISTORY — PX: ABDOMINAL AORTOGRAM W/LOWER EXTREMITY: CATH118223

## 2022-03-01 LAB — BASIC METABOLIC PANEL
Anion gap: 6 (ref 5–15)
BUN: 9 mg/dL (ref 6–20)
CO2: 20 mmol/L — ABNORMAL LOW (ref 22–32)
Calcium: 8 mg/dL — ABNORMAL LOW (ref 8.9–10.3)
Chloride: 111 mmol/L (ref 98–111)
Creatinine, Ser: 1.02 mg/dL — ABNORMAL HIGH (ref 0.44–1.00)
GFR, Estimated: >60 mL/min (ref >60)
Glucose, Bld: 103 mg/dL — ABNORMAL HIGH (ref 70–99)
Potassium: 3.1 mmol/L — ABNORMAL LOW (ref 3.5–5.1)
Sodium: 137 mmol/L (ref 135–145)

## 2022-03-01 LAB — CBC
HCT: 24.7 % — ABNORMAL LOW (ref 36.0–46.0)
Hemoglobin: 8 g/dL — ABNORMAL LOW (ref 12.0–15.0)
MCH: 27.8 pg (ref 26.0–34.0)
MCHC: 32.4 g/dL (ref 30.0–36.0)
MCV: 85.8 fL (ref 80.0–100.0)
Platelets: 291 10*3/uL (ref 150–400)
RBC: 2.88 MIL/uL — ABNORMAL LOW (ref 3.87–5.11)
RDW: 14.1 % (ref 11.5–15.5)
WBC: 8.8 10*3/uL (ref 4.0–10.5)
nRBC: 0 % (ref 0.0–0.2)

## 2022-03-01 LAB — GLUCOSE, CAPILLARY
Glucose-Capillary: 153 mg/dL — ABNORMAL HIGH (ref 70–99)
Glucose-Capillary: 66 mg/dL — ABNORMAL LOW (ref 70–99)
Glucose-Capillary: 226 mg/dL — ABNORMAL HIGH (ref 70–99)
Glucose-Capillary: 215 mg/dL — ABNORMAL HIGH (ref 70–99)

## 2022-03-01 LAB — PREGNANCY, URINE: Preg Test, Ur: NEGATIVE

## 2022-03-01 SURGERY — ABDOMINAL AORTOGRAM W/LOWER EXTREMITY
Anesthesia: LOCAL

## 2022-03-01 MED ORDER — ONDANSETRON HCL 4 MG/2ML IJ SOLN: 4.0000 mg | Freq: Four times a day (QID) | INTRAMUSCULAR | Status: DC | PRN

## 2022-03-01 MED ORDER — SODIUM CHLORIDE 0.9 % IV SOLN: 250.0000 mL | INTRAVENOUS | Status: DC | PRN

## 2022-03-01 MED ORDER — HYDRALAZINE HCL 20 MG/ML IJ SOLN: 5.0000 mg | INTRAMUSCULAR | Status: DC | PRN

## 2022-03-01 MED ORDER — FENTANYL CITRATE (PF) 100 MCG/2ML IJ SOLN
INTRAMUSCULAR | Status: AC
  Filled 2022-03-01: qty 2

## 2022-03-01 MED ORDER — SODIUM CHLORIDE 0.9% FLUSH: 3.0000 mL | INTRAVENOUS | Status: DC | PRN

## 2022-03-01 MED ORDER — IODIXANOL 320 MG/ML IV SOLN
INTRAVENOUS | Status: DC | PRN
  Administered 2022-03-01: 63 mL via INTRA_ARTERIAL

## 2022-03-01 MED ORDER — HEPARIN (PORCINE) IN NACL 1000-0.9 UT/500ML-% IV SOLN
INTRAVENOUS | Status: AC
  Filled 2022-03-01: qty 1000

## 2022-03-01 MED ORDER — LABETALOL HCL 5 MG/ML IV SOLN: 10.0000 mg | INTRAVENOUS | Status: DC | PRN

## 2022-03-01 MED ORDER — LIDOCAINE HCL (PF) 1 % IJ SOLN
INTRAMUSCULAR | Status: DC | PRN
  Administered 2022-03-01: 15 mL

## 2022-03-01 MED ORDER — ACETAMINOPHEN 325 MG PO TABS: 650.0000 mg | ORAL_TABLET | ORAL | Status: DC | PRN

## 2022-03-01 MED ORDER — LIP MEDEX EX OINT
TOPICAL_OINTMENT | CUTANEOUS | Status: DC | PRN
  Filled 2022-03-01: qty 7

## 2022-03-01 MED ORDER — SODIUM CHLORIDE 0.9% FLUSH
3.0000 mL | Freq: Two times a day (BID) | INTRAVENOUS | Status: DC
  Administered 2022-03-01 – 2022-03-06 (×6): 3 mL via INTRAVENOUS

## 2022-03-01 MED ORDER — MIDAZOLAM HCL 2 MG/2ML IJ SOLN
INTRAMUSCULAR | Status: AC
  Filled 2022-03-01: qty 2

## 2022-03-01 MED ORDER — SODIUM CHLORIDE 0.9 % IV SOLN: INTRAVENOUS | Status: AC

## 2022-03-01 MED ORDER — POTASSIUM CHLORIDE CRYS ER 20 MEQ PO TBCR
40.0000 meq | EXTENDED_RELEASE_TABLET | ORAL | Status: AC
  Administered 2022-03-01 (×2): 40 meq via ORAL
  Filled 2022-03-01 (×2): qty 2

## 2022-03-01 MED ORDER — LIDOCAINE HCL (PF) 1 % IJ SOLN
INTRAMUSCULAR | Status: AC
  Filled 2022-03-01: qty 30

## 2022-03-01 MED ORDER — FENTANYL CITRATE (PF) 100 MCG/2ML IJ SOLN
INTRAMUSCULAR | Status: DC | PRN
  Administered 2022-03-01: 25 ug via INTRAVENOUS

## 2022-03-01 MED ORDER — MIDAZOLAM HCL 2 MG/2ML IJ SOLN
INTRAMUSCULAR | Status: DC | PRN
  Administered 2022-03-01: 1 mg via INTRAVENOUS

## 2022-03-01 MED ORDER — VANCOMYCIN HCL IN DEXTROSE 1-5 GM/200ML-% IV SOLN
INTRAVENOUS | Status: AC | PRN
  Administered 2022-03-01 (×2): 1250 mg via INTRAVENOUS

## 2022-03-01 SURGICAL SUPPLY — 11 items
CATH OMNI FLUSH 5F 65CM (CATHETERS) IMPLANT
DEVICE CLOSURE MYNXGRIP 5F (Vascular Products) IMPLANT
GUIDEWIRE ANGLED .035X150CM (WIRE) IMPLANT
KIT MICROPUNCTURE NIT STIFF (SHEATH) IMPLANT
KIT PV (KITS) ×1 IMPLANT
SHEATH PINNACLE 5F 10CM (SHEATH) IMPLANT
SHEATH PROBE COVER 6X72 (BAG) IMPLANT
SYR MEDRAD MARK 7 150ML (SYRINGE) ×1 IMPLANT
TRANSDUCER W/STOPCOCK (MISCELLANEOUS) ×1 IMPLANT
TRAY PV CATH (CUSTOM PROCEDURE TRAY) ×1 IMPLANT
WIRE BENTSON .035X145CM (WIRE) IMPLANT

## 2022-03-01 NOTE — Plan of Care (Signed)
  Problem: Health Behavior/Discharge Planning: Goal: Ability to manage health-related needs will improve Outcome: Progressing   Problem: Clinical Measurements: Goal: Ability to maintain clinical measurements within normal limits will improve Outcome: Progressing   Problem: Clinical Measurements: Goal: Respiratory complications will improve Outcome: Progressing   Problem: Clinical Measurements: Goal: Cardiovascular complication will be avoided Outcome: Progressing   Problem: Coping: Goal: Level of anxiety will decrease Outcome: Progressing

## 2022-03-01 NOTE — Progress Notes (Signed)
Vascular and Vein Specialists of [place]     Objective 119/80 95 99 F (37.2 C) (Oral) 16 99%  Intake/Output Summary (Last 24 hours) at 03/01/2022 1018 Last data filed at 03/01/2022 0303 Gross per 24 hour  Intake 1747.17 ml  Output --  Net 1747.17 ml       Laboratory Lab Results: Recent Labs    02/27/22 0247 02/28/22 0712  WBC 10.1 8.4  HGB 8.6* 8.1*  HCT 28.0* 25.4*  PLT 255 280   BMET Recent Labs    02/27/22 0247 02/28/22 0712  NA 129* 133*  K 4.0 3.4*  CL 106 106  CO2 16* 20*  GLUCOSE 311* 292*  BUN 16 14  CREATININE 1.24* 1.03*  CALCIUM 7.3* 7.5*    COAG Lab Results  Component Value Date   INR 1.0 03/17/2020   No results found for: "PTT"  Assessment/Planning:   Plan aortogram, lower extremity arteriogram, with possible intervention with a focus on the right leg.  Seen in consultation yesterday by Dr. Myra Gianotti.  Abigail Moran 03/01/2022 10:18 AM --

## 2022-03-01 NOTE — Progress Notes (Signed)
Pt arrived to 4E from Cath lab. VSS. A&Ox4. CHG bath done. 0/10 pain. Tele applied; CCMD notified. Left groin level 0. Call light in reach. Family at bedside.  Brooke Pace, RN

## 2022-03-01 NOTE — Progress Notes (Signed)
   PODIATRY PROGRESS NOTE  NAME Abigail Moran MRN [id number] DOB 07/27/1975 DOA 02/25/2022   Chief Complaint  Patient presents with   Toe Pain    HPI: 46 y.o. female seen originally at [facility] for osteomyelitis of the fourth and fifth digits of the right foot.  Patient transferred to [facility] for vascular consultation and angiogram.  Angiogram performed today demonstrating patent three-vessel runoff RLE.  Flow was very brisk into the RT foot.  Patient is ready to proceed with toe amputations of the right fourth and fifth digits as planned previously.  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   Heart attack (HCC) 05/04/15   History of DVT (deep vein thrombosis)    following hospitalization 2020   History of kidney stones    Menorrhagia with irregular cycle 10/11/2020   Renal disorder    Sepsis due to cellulitis (HCC) 05/15/2021   Vitamin D deficiency 09/14/2014       Latest Ref Rng & Units 03/01/2022    9:43 AM 02/28/2022    7:12 AM 02/27/2022    2:47 AM  CBC  WBC 4.0 - 10.5 K/uL 8.8  8.4  10.1   Hemoglobin 12.0 - 15.0 g/dL 8.0  8.1  8.6   Hematocrit 36.0 - 46.0 % 24.7  25.4  28.0   Platelets 150 - 400 K/uL 291  280  255        Latest Ref Rng & Units 03/01/2022    9:43 AM 02/28/2022    7:12 AM 02/27/2022    2:47 AM  BMP  Glucose 70 - 99 mg/dL 103  292  311   BUN 6 - 20 mg/dL 9  14  16   Creatinine 0.44 - 1.00 mg/dL 1.02  1.03  1.24   Sodium 135 - 145 mmol/L 137  133  129   Potassium 3.5 - 5.1 mmol/L 3.1  3.4  4.0   Chloride 98 - 111 mmol/L 111  106  106   CO2 22 - 32 mmol/L 20  20  16   Calcium 8.9 - 10.3 mg/dL 8.0  7.5  7.3    ASSESSMENT/PLAN OF CARE Osteomyelitis right fourth and fifth digits -Preoperative  orders placed.  Surgery will consist of right fourth and fifth digit amputations.  I&D right foot.  Plan for tomorrow, 03/02/2022, after 5 PM -N.p.o. after midnight -Podiatry will continue to monitor   Torre Pikus M. Obdulia Steier, DPM [facility]  Dr. Abhijay Morriss M. Belisa Eichholz, DPM    [address]                                        [place], [place] [address]                Office [phone]  Fax [phone]     

## 2022-03-01 NOTE — H&P (View-Only) (Signed)
   PODIATRY PROGRESS NOTE  NAME Abigail Moran MRN [id number] DOB [birth year] DOA 02/25/2022   Chief Complaint  Patient presents with   Toe Pain    HPI: 46 y.o. female seen originally at [facility] for osteomyelitis of the fourth and fifth digits of the right foot.  Patient transferred to [facility] for vascular consultation and angiogram.  Angiogram performed today demonstrating patent three-vessel runoff RLE.  Flow was very brisk into the RT foot.  Patient is ready to proceed with toe amputations of the right fourth and fifth digits as planned previously.  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   Heart attack (HCC) 05/04/15   History of DVT (deep vein thrombosis)    following hospitalization 2020   History of kidney stones    Menorrhagia with irregular cycle 10/11/2020   Renal disorder    Sepsis due to cellulitis (HCC) 05/15/2021   Vitamin D deficiency 09/14/2014       Latest Ref Rng & Units 03/01/2022    9:43 AM 02/28/2022    7:12 AM 02/27/2022    2:47 AM  CBC  WBC 4.0 - 10.5 K/uL 8.8  8.4  10.1   Hemoglobin 12.0 - 15.0 g/dL 8.0  8.1  8.6   Hematocrit 36.0 - 46.0 % 24.7  25.4  28.0   Platelets 150 - 400 K/uL 291  280  255        Latest Ref Rng & Units 03/01/2022    9:43 AM 02/28/2022    7:12 AM 02/27/2022    2:47 AM  BMP  Glucose 70 - 99 mg/dL 537  482  707   BUN 6 - 20 mg/dL 9  14  16    Creatinine 0.44 - 1.00 mg/dL  8.67  5.44   Sodium 135 - 145 mmol/L 137  133  129   Potassium 3.5 - 5.1 mmol/L 3.1  3.4  4.0   Chloride 98 - 111 mmol/L 111  106  106   CO2 22 - 32 mmol/L 20  20  16    Calcium 8.9 - 10.3 mg/dL 8.0  7.5  7.3    ASSESSMENT/PLAN OF CARE Osteomyelitis right fourth and fifth digits -Preoperative  orders placed.  Surgery will consist of right fourth and fifth digit amputations.  I&D right foot.  Plan for tomorrow, 03/02/2022, after 5 PM -N.p.o. after midnight -Podiatry will continue to monitor   , DPM [facility]  Dr. 03/04/2022, DPM    [address] [place], [address] [place]                Office [phone]  Fax [phone]

## 2022-03-01 NOTE — Op Note (Signed)
    Patient name: Abigail Moran MRN: [id number] DOB: September 29, 1975 Sex: female  03/01/2022 Pre-operative Diagnosis: Right lower extremity tissue loss Post-operative diagnosis:  Same Surgeon:  Cephus Shelling, MD Procedure Performed: 1.  Ultrasound-guided access left common femoral artery 2.  Aortogram with catheter selection of aorta 3.  Right lower extremity arteriogram with selection of second-order branches 4.  Mynx closure of the left common femoral artery 5.  19 minutes of monitored moderate conscious sedation time  Indications: Patient is a 46 year old female with diabetes that has developed tissue loss in the right foot.  She is scheduled for toe amputations with podiatry and vascular surgery was asked to evaluate inflow for adequacy of healing.  She presents after risks benefits discussed.  Findings:   Aortogram showed both renal arteries are patent bilaterally without flow limiting stenosis.  Her infrarenal aorta is patent.  Both iliac arteries are widely patent without flow-limiting stenosis.  Right lower extremity arteriogram showed a widely patent common femoral, profunda, SFA, popliteal and three-vessel runoff.  Flow was very brisk into the right foot.   Procedure:  The patient was identified in the holding area and taken to room 8.  The patient was then placed supine on the table and prepped and draped in the usual sterile fashion.  A time out was called.  Ultrasound was used to evaluate the left common femoral artery.  It was patent .  A digital ultrasound image was acquired.  A micropuncture needle was used to access the left common femoral artery under ultrasound guidance.  An 018 wire was advanced without resistance and a micropuncture sheath was placed.  The 018 wire was removed and a benson wire was placed.  The micropuncture sheath was exchanged for a 5 french sheath.  An omniflush catheter was advanced over the wire to the level of L-1.  An abdominal angiogram was obtained.   Next, using the omniflush catheter and a benson wire, the aortic bifurcation was crossed and the catheter was placed into theright external iliac artery and right runoff was obtained.  Pertinent findings are noted above.  No intervention performed.  Wires and catheters were removed.  Mynx closure device deployed in the left common femoral artery.  Pain: Patient has excellent flow down the right lower extremity with no stenosis and 3 vessel runoff and should have adequate inflow for healing toe amputations.  Cephus Shelling, MD [facility] [place] Office: [phone]

## 2022-03-01 NOTE — Progress Notes (Signed)
PROGRESS NOTE    Abigail Moran  NWG:[id number] DOB: 01/02/1976 DOA: 02/25/2022 PCP: Sandford Craze, NP   Brief Narrative:  46 y.o. female with medical history significant of uncontrolled type I diabetes, medical noncompliance, gastroparesis, marijuana abuse, erythropoietin deficiency anemia, right foot cellulitis and abscess in 05/2021, DVT, nephrolithiasis, menorrhagia, CKD stage II-IIIa, vitamin D deficiency, CAD, hypertension, hyperlipidemia.  Recently seen in the ED on 8/7 for right foot infection.  X-ray was negative for osteomyelitis and she was discharged with prescription for doxycycline.  She did not take the antibiotic and also noncompliant with insulin.  Returned to the ED with toe pain/worsening infection of her right foot.  She was found to be in DKA.  She was hospitalized for further management.  Podiatry consulted.  Recommend transferring to [facility] for vascular surgery consultation and intervention prior to right fourth and fifth digit amputations.  Consulted vascular surgery for evaluation prior to likely amputation per podiatry.    Assessment & Plan:   Principal Problem:   DKA, type 1 (HCC) Active Problems:   CAD (coronary artery disease)   Erythropoietin deficiency anemia   Hyperlipidemia   Hyponatremia   Sepsis (HCC)   Primary hypertension   Diabetic foot infection (HCC)   Acute-on-chronic kidney injury (HCC)   DKA (diabetic ketoacidosis) (HCC)  Diabetic ketoacidosis in the setting of poorly controlled diabetes mellitus type 1, resolved - Anion gap closed, transitioned to subcu insulin and p.o. intake  - Monitor closely while n.p.o. perioperatively, high risk to reopen anion gap  - HbA1c 13.1 implying long-term noncompliance.   -Continue long-acting Semglee 20 units daily, sliding scale insulin and hypoglycemic protocol.   Sepsis secondary to diabetic infection right foot/osteomyelitis with abscess right  fourth and fifth digits, sepsis present on  admission -Continue broad-spectrum antibiotics with cefepime, vancomycin -Podiatry following pending vascular studies for operative management  -Three-vessel runoff reassuring for adequate blood flow to support amputation -Leukocytosis tachycardia with notable source meet criteria for sepsis, POA -Imaging consistent with osteomyelitis, MRI confirming.   Acute kidney injury on chronic kidney disease stage IIIa Baseline creatinine 1.0.  Presented with creatinine of 1.4.   Status post IV fluids in the setting of DKA, continue fluids while n.p.o. perioperatively  Follow closely given need for contrast today with vascular evaluation   Hypokalemia, ongoing Replace and follow.  Magnesium 2.2.   Hyponatremia Pseudohyponatremia, resolved with correction of glucose   Anemia of chronic kidney disease Hemoglobin stabilizing, likely partially dilutional at intake   Essential hypertension Hold home medications, currently well controlled   Coronary artery disease Without current symptomatology, resume home medications at discharge   Hyperlipidemia Resume statin.   Medical noncompliance Plans was emphasized to the patient.  She however mentions that she does take her insulin regularly.  This will need to be an ongoing conversation with her.      Body mass index is 33.27 kg/m./Obesity  DVT prophylaxis: Heparin Code Status: Full Family Communication: None present  Status is: Inpatient  Dispo: The patient is from: Home              Anticipated d/c is to: Home              Anticipated d/c date is: 48 to 72 hours pending surgical course              Patient currently not medically stable for discharge  Consultants:  Podiatry, vascular  Procedures:  Aortogram, right lower extremity Tentative debridement versus amputation per podiatry, pending further evaluation  Antimicrobials:  Cefepime, vancomycin, ongoing  Subjective: No acute issues or events overnight denies nausea vomiting  diarrhea constipation any fevers chills or chest pain.  Right foot pain currently well controlled  Objective: Vitals:   03/01/22 1400 03/01/22 1415 03/01/22 1428 03/01/22 1430  BP: (!) 155/99 132/85 122/78 122/78  Pulse: (!) 107 (!) 113 (!) 105 (!) 103  Resp: (!) 8 (!) 21 14 16   Temp:      TempSrc:      SpO2: 100% 100% 97% 99%  Weight:      Height:        Intake/Output Summary (Last 24 hours) at 03/01/2022 1456 Last data filed at 03/01/2022 1425 Gross per 24 hour  Intake 1627.17 ml  Output 800 ml  Net 827.17 ml   Filed Weights   02/25/22 1943 02/26/22 0120  Weight: 90.7 kg 92.1 kg    Examination:  General:  Pleasantly resting in bed, No acute distress. HEENT:  Normocephalic atraumatic.  Sclerae nonicteric, noninjected.  Extraocular movements intact bilaterally. Neck:  Without mass or deformity.  Trachea is midline. Lungs:  Clear to auscultate bilaterally without rhonchi, wheeze, or rales. Heart:  Regular rate and rhythm.  Without murmurs, rubs, or gallops. Abdomen:  Soft, nontender, nondistended.  Without guarding or rebound. Extremities: Right foot bandage clean dry intact Vascular:  Dorsalis pedis and posterior tibial pulses palpable bilaterally.   Data Reviewed: I have personally reviewed following labs and imaging studies  CBC: Recent Labs  Lab 02/25/22 2040 02/25/22 2215 02/26/22 0257 02/27/22 0247 02/28/22 0712 03/01/22 0943  WBC 12.9*  --  12.2* 10.1 8.4 8.8  NEUTROABS 11.1*  --   --   --   --   --   HGB 10.2* 10.5* 9.9* 8.6* 8.1* 8.0*  HCT 32.1* 31.0* 31.1* 28.0* 25.4* 24.7*  MCV 88.2  --  88.6 93.3 88.5 85.8  PLT 304  --  303 255 280 [id number]   Basic Metabolic Panel: Recent Labs  Lab 02/26/22 0257 02/26/22 0623 02/27/22 0247 02/28/22 0712 03/01/22 0943  NA 131* 132* 129* 133* 137  K 4.1 3.8 4.0 3.4* 3.1*  CL 102 105 106 106 111  CO2 15* 17* 16* 20* 20*  GLUCOSE 157* 164* 311* 292* 103*  BUN 16 16 16 14 9   CREATININE 1.28* 1.25* 1.24* 1.03*  1.02*  CALCIUM 7.9* 7.7* 7.3* 7.5* 8.0*  MG  --   --  2.2  --   --    GFR: Estimated Creatinine Clearance: 77.3 mL/min (A) (by C-G formula based on SCr of 1.02 mg/dL (H)). Liver Function Tests: Recent Labs  Lab 02/25/22 2230  AST 15  ALT 11  ALKPHOS 130*  BILITOT 1.2  PROT 8.3*  ALBUMIN 2.9*   No results for input(s): "LIPASE", "AMYLASE" in the last 168 hours. No results for input(s): "AMMONIA" in the last 168 hours. Coagulation Profile: No results for input(s): "INR", "PROTIME" in the last 168 hours. Cardiac Enzymes: No results for input(s): "CKTOTAL", "CKMB", "CKMBINDEX", "TROPONINI" in the last 168 hours. BNP (last 3 results) No results for input(s): "PROBNP" in the last 8760 hours. HbA1C: No results for input(s): "HGBA1C" in the last 72 hours. CBG: Recent Labs  Lab 02/28/22 1121 02/28/22 1638 02/28/22 2047 03/01/22 0819 03/01/22 1307  GLUCAP 153* 136* 134* 153* 66*   Lipid Profile: No results for input(s): "CHOL", "HDL", "LDLCALC", "TRIG", "CHOLHDL", "LDLDIRECT" in the last 72 hours. Thyroid Function Tests: No results for input(s): "TSH", "T4TOTAL", "FREET4", "T3FREE", "THYROIDAB" in the last  72 hours. Anemia Panel: No results for input(s): "VITAMINB12", "FOLATE", "FERRITIN", "TIBC", "IRON", "RETICCTPCT" in the last 72 hours. Sepsis Labs: Recent Labs  Lab 02/25/22 2157 02/25/22 2318  LATICACIDVEN 1.1 1.3    Recent Results (from the past 240 hour(s))  Culture, blood (routine x 2)     Status: None (Preliminary result)   Collection Time: 02/25/22  9:58 PM   Specimen: BLOOD LEFT ARM  Result Value Ref Range Status   Specimen Description   Final    BLOOD LEFT ARM Performed at [facility], [address] [place], [place] [address]    Special Requests   Final    BOTTLES DRAWN AEROBIC AND ANAEROBIC Blood Culture adequate volume Performed at [facility] [address] [place], [place] [address]    Culture   Final    NO GROWTH 3  DAYS Performed at [facility], [place] [address] [place], [place] [address]    Report Status PENDING  Incomplete  Culture, blood (routine x 2)     Status: None (Preliminary result)   Collection Time: 02/25/22 10:29 PM   Specimen: BLOOD RIGHT ARM  Result Value Ref Range Status   Specimen Description   Final    BLOOD RIGHT ARM Performed at [facility], [address] [place], [place] [address]    Special Requests   Final    BOTTLES DRAWN AEROBIC AND ANAEROBIC Blood Culture adequate volume Performed at [facility], [address] [place], [place] [address]    Culture   Final    NO GROWTH 3 DAYS Performed at [facility], [place] [address] [place], [place] [address]    Report Status PENDING  Incomplete  MRSA Next Gen by PCR, Nasal     Status: None   Collection Time: 02/26/22  1:19 AM   Specimen: Nasal Mucosa; Nasal Swab  Result Value Ref Range Status   MRSA by PCR Next Gen NOT DETECTED NOT DETECTED Final    Comment: (NOTE) The GeneXpert MRSA Assay (FDA approved for NASAL specimens only), is one component of a comprehensive MRSA colonization surveillance program. It is not intended to diagnose MRSA infection nor to guide or monitor treatment for MRSA infections. Test performance is not FDA approved in patients less than 51 years old. Performed at [facility], [place] [address] [place], [place] [address]     Radiology Studies: PERIPHERAL VASCULAR CATHETERIZATION  Result Date: 03/01/2022 Patient name: Tasmin Zahradka          MRN: [id number]        DOB: 1976-01-06            Sex: female  03/01/2022 Pre-operative Diagnosis: Right lower extremity tissue loss Post-operative diagnosis:  Same Surgeon:  Marty Heck, MD Procedure Performed: 1.  Ultrasound-guided access left common femoral artery 2.  Aortogram with catheter selection of aorta 3.  Right lower extremity arteriogram with selection of second-order branches 4.  Mynx closure  of the left common femoral artery 5.  19 minutes of monitored moderate conscious sedation time  Indications: Patient is a 46 year old female with diabetes that has developed tissue loss in the right foot.  She is scheduled for toe amputations with podiatry and vascular surgery was asked to evaluate inflow for adequacy of healing.  She presents after risks benefits discussed.  Findings:  Aortogram showed both renal arteries are patent bilaterally without flow limiting stenosis.  Her infrarenal  aorta is patent.  Both iliac arteries are widely patent without flow-limiting stenosis.  Right lower extremity arteriogram showed a widely patent common femoral, profunda, SFA, popliteal and three-vessel runoff.  Flow was very brisk into the right foot.             Procedure:  The patient was identified in the holding area and taken to room 8.  The patient was then placed supine on the table and prepped and draped in the usual sterile fashion.  A time out was called.  Ultrasound was used to evaluate the left common femoral artery.  It was patent .  A digital ultrasound image was acquired.  A micropuncture needle was used to access the left common femoral artery under ultrasound guidance.  An 018 wire was advanced without resistance and a micropuncture sheath was placed.  The 018 wire was removed and a benson wire was placed.  The micropuncture sheath was exchanged for a 5 french sheath.  An omniflush catheter was advanced over the wire to the level of L-1.  An abdominal angiogram was obtained.  Next, using the omniflush catheter and a benson wire, the aortic bifurcation was crossed and the catheter was placed into theright external iliac artery and right runoff was obtained.  Pertinent findings are noted above.  No intervention performed.  Wires and catheters were removed.  Mynx closure device deployed in the left common femoral artery.  Pain: Patient has excellent flow down the right lower extremity with no stenosis and 3  vessel runoff and should have adequate inflow for healing toe amputations.  Cephus Shelling, MD [facility] Office: [phone]     Scheduled Meds:  Chlorhexidine Gluconate Cloth  6 each Topical Daily   heparin injection (subcutaneous)  5,000 Units Subcutaneous Q8H   insulin aspart  0-15 Units Subcutaneous TID WC   insulin aspart  0-5 Units Subcutaneous QHS   insulin aspart  4 Units Subcutaneous TID WC   insulin glargine-yfgn  20 Units Subcutaneous Daily   potassium chloride  40 mEq Oral Q4H   Continuous Infusions:  sodium chloride 75 mL/hr (03/01/22 1425)   ceFEPime (MAXIPIME) IV 2 g (03/01/22 0420)   metronidazole 500 mg (03/01/22 1031)   vancomycin Stopped (02/28/22 1845)     LOS: 3 days   Time spent:  Azucena Fallen, DO [facility]  If 7PM-7AM, please contact night-coverage [email or web address]  03/01/2022, 2:56 PM

## 2022-03-01 NOTE — Progress Notes (Signed)
Pt picked up [facility] to transfer to [facility] Bed 20, report phoned to Hermine Messick RN. VSS at time of departure. Pt A/O x4.

## 2022-03-01 NOTE — Progress Notes (Signed)
On arrival to holding area patients CBG was 66. Patient refused juice and was given lunch tray with sandwich, applesauce and chips. Will recheck blood sugar 30 minutes after meal.

## 2022-03-02 ENCOUNTER — Inpatient Hospital Stay (HOSPITAL_COMMUNITY): Payer: Self-pay

## 2022-03-02 ENCOUNTER — Inpatient Hospital Stay (HOSPITAL_COMMUNITY): Payer: Self-pay | Admitting: Anesthesiology

## 2022-03-02 ENCOUNTER — Encounter (HOSPITAL_COMMUNITY): Payer: Self-pay | Admitting: Vascular Surgery

## 2022-03-02 ENCOUNTER — Encounter (HOSPITAL_COMMUNITY)
Admission: EM | Disposition: A | Discharge status: Home / Self Care | Payer: Self-pay | Source: Home / Self Care | Attending: Internal Medicine

## 2022-03-02 DIAGNOSIS — S98141A Partial traumatic amputation of one right lesser toe, initial encounter: Secondary | ICD-10-CM

## 2022-03-02 DIAGNOSIS — I252 Old myocardial infarction: Secondary | ICD-10-CM

## 2022-03-02 DIAGNOSIS — I2510 Atherosclerotic heart disease of native coronary artery without angina pectoris: Secondary | ICD-10-CM

## 2022-03-02 DIAGNOSIS — I10 Essential (primary) hypertension: Secondary | ICD-10-CM

## 2022-03-02 HISTORY — PX: AMPUTATION: SHX166

## 2022-03-02 LAB — BASIC METABOLIC PANEL
Anion gap: 6 (ref 5–15)
BUN: 9 mg/dL (ref 6–20)
CO2: 21 mmol/L — ABNORMAL LOW (ref 22–32)
Calcium: 7.9 mg/dL — ABNORMAL LOW (ref 8.9–10.3)
Chloride: 109 mmol/L (ref 98–111)
Creatinine, Ser: 1.04 mg/dL — ABNORMAL HIGH (ref 0.44–1.00)
GFR, Estimated: >60 mL/min (ref >60)
Glucose, Bld: 108 mg/dL — ABNORMAL HIGH (ref 70–99)
Potassium: 4.2 mmol/L (ref 3.5–5.1)
Sodium: 136 mmol/L (ref 135–145)

## 2022-03-02 LAB — MAGNESIUM: Magnesium: 2.1 mg/dL (ref 1.7–2.4)

## 2022-03-02 LAB — CBC
HCT: 24.7 % — ABNORMAL LOW (ref 36.0–46.0)
Hemoglobin: 8 g/dL — ABNORMAL LOW (ref 12.0–15.0)
MCH: 28.4 pg (ref 26.0–34.0)
MCHC: 32.4 g/dL (ref 30.0–36.0)
MCV: 87.6 fL (ref 80.0–100.0)
Platelets: 318 10*3/uL (ref 150–400)
RBC: 2.82 MIL/uL — ABNORMAL LOW (ref 3.87–5.11)
RDW: 14.4 % (ref 11.5–15.5)
WBC: 8.8 10*3/uL (ref 4.0–10.5)
nRBC: 0 % (ref 0.0–0.2)

## 2022-03-02 LAB — GLUCOSE, CAPILLARY
Glucose-Capillary: 104 mg/dL — ABNORMAL HIGH (ref 70–99)
Glucose-Capillary: 128 mg/dL — ABNORMAL HIGH (ref 70–99)
Glucose-Capillary: 104 mg/dL — ABNORMAL HIGH (ref 70–99)
Glucose-Capillary: 116 mg/dL — ABNORMAL HIGH (ref 70–99)
Glucose-Capillary: 126 mg/dL — ABNORMAL HIGH (ref 70–99)
Glucose-Capillary: 115 mg/dL — ABNORMAL HIGH (ref 70–99)

## 2022-03-02 SURGERY — AMPUTATION DIGIT
Anesthesia: General | Laterality: Right

## 2022-03-02 MED ORDER — ARTIFICIAL TEARS OPHTHALMIC OINT
TOPICAL_OINTMENT | OPHTHALMIC | Status: AC
Start: 2022-03-02 — End: ?
  Filled 2022-03-02: qty 3.5

## 2022-03-02 MED ORDER — MIDAZOLAM HCL 2 MG/2ML IJ SOLN
INTRAMUSCULAR | Status: AC
  Filled 2022-03-02: qty 2

## 2022-03-02 MED ORDER — OXYCODONE HCL 5 MG/5ML PO SOLN: 5.0000 mg | Freq: Once | ORAL | Status: DC | PRN

## 2022-03-02 MED ORDER — LACTATED RINGERS IV SOLN: INTRAVENOUS | Status: DC | PRN

## 2022-03-02 MED ORDER — FENTANYL CITRATE (PF) 250 MCG/5ML IJ SOLN
INTRAMUSCULAR | Status: DC | PRN
Start: 2022-03-02 — End: 2022-03-02
  Administered 2022-03-02 (×2): 25 ug via INTRAVENOUS

## 2022-03-02 MED ORDER — LIDOCAINE 2% (20 MG/ML) 5 ML SYRINGE
INTRAMUSCULAR | Status: AC
  Filled 2022-03-02: qty 5

## 2022-03-02 MED ORDER — OXYCODONE HCL 5 MG PO TABS: 5.0000 mg | ORAL_TABLET | Freq: Once | ORAL | Status: DC | PRN

## 2022-03-02 MED ORDER — PHENYLEPHRINE 80 MCG/ML (10ML) SYRINGE FOR IV PUSH (FOR BLOOD PRESSURE SUPPORT)
PREFILLED_SYRINGE | INTRAVENOUS | Status: DC | PRN
  Administered 2022-03-02 (×2): 80 ug via INTRAVENOUS

## 2022-03-02 MED ORDER — MIDAZOLAM HCL 2 MG/2ML IJ SOLN
INTRAMUSCULAR | Status: DC | PRN
  Administered 2022-03-02: 2 mg via INTRAVENOUS

## 2022-03-02 MED ORDER — FENTANYL CITRATE (PF) 100 MCG/2ML IJ SOLN
INTRAMUSCULAR | Status: AC
  Filled 2022-03-02: qty 2

## 2022-03-02 MED ORDER — BUPIVACAINE HCL (PF) 0.5 % IJ SOLN
INTRAMUSCULAR | Status: AC
  Filled 2022-03-02: qty 30

## 2022-03-02 MED ORDER — FENTANYL CITRATE (PF) 100 MCG/2ML IJ SOLN
25.0000 ug | INTRAMUSCULAR | Status: DC | PRN
  Administered 2022-03-02 (×2): 50 ug via INTRAVENOUS

## 2022-03-02 MED ORDER — ONDANSETRON HCL 4 MG/2ML IJ SOLN
INTRAMUSCULAR | Status: DC | PRN
  Administered 2022-03-02: 4 mg via INTRAVENOUS

## 2022-03-02 MED ORDER — PROPOFOL 10 MG/ML IV BOLUS
INTRAVENOUS | Status: DC | PRN
  Administered 2022-03-02: 200 mg via INTRAVENOUS

## 2022-03-02 MED ORDER — LIDOCAINE 2% (20 MG/ML) 5 ML SYRINGE
INTRAMUSCULAR | Status: DC | PRN
  Administered 2022-03-02: 60 mg via INTRAVENOUS

## 2022-03-02 MED ORDER — FENTANYL CITRATE (PF) 250 MCG/5ML IJ SOLN
INTRAMUSCULAR | Status: AC
  Filled 2022-03-02: qty 5

## 2022-03-02 MED ORDER — HEPARIN SODIUM (PORCINE) 5000 UNIT/ML IJ SOLN
5000.0000 [IU] | Freq: Three times a day (TID) | INTRAMUSCULAR | Status: DC
  Administered 2022-03-03 – 2022-03-06 (×10): 5000 [IU] via SUBCUTANEOUS
  Filled 2022-03-02 (×11): qty 1

## 2022-03-02 MED ORDER — ONDANSETRON HCL 4 MG/2ML IJ SOLN: 4.0000 mg | Freq: Four times a day (QID) | INTRAMUSCULAR | Status: DC | PRN

## 2022-03-02 MED ORDER — ONDANSETRON HCL 4 MG/2ML IJ SOLN
INTRAMUSCULAR | Status: AC
Start: 2022-03-02 — End: ?
  Filled 2022-03-02: qty 2

## 2022-03-02 MED ORDER — 0.9 % SODIUM CHLORIDE (POUR BTL) OPTIME
TOPICAL | Status: DC | PRN
  Administered 2022-03-02: 1000 mL

## 2022-03-02 MED ORDER — BUPIVACAINE HCL (PF) 0.5 % IJ SOLN
INTRAMUSCULAR | Status: DC | PRN
  Administered 2022-03-02: 10 mL

## 2022-03-02 MED FILL — Heparin Sod (Porcine)-NaCl IV Soln 1000 Unit/500ML-0.9%: INTRAVENOUS | Qty: 500 | Status: AC

## 2022-03-02 SURGICAL SUPPLY — 36 items
ALLOGRAFT SALERA POWDER 80 (Graft) IMPLANT
BAG COUNTER SPONGE SURGICOUNT (BAG) ×1 IMPLANT
BLADE LONG MED 31X9 (MISCELLANEOUS) IMPLANT
BNDG ELASTIC 4X5.8 VLCR STR LF (GAUZE/BANDAGES/DRESSINGS) IMPLANT
BNDG GAUZE ELAST 4 BULKY (GAUZE/BANDAGES/DRESSINGS) ×1 IMPLANT
COVER SURGICAL LIGHT HANDLE (MISCELLANEOUS) ×1 IMPLANT
CUFF TOURN SGL QUICK 18X4 (TOURNIQUET CUFF) ×1 IMPLANT
DRSG ADAPTIC 3X8 NADH LF (GAUZE/BANDAGES/DRESSINGS) IMPLANT
DRSG PAD ABDOMINAL 8X10 ST (GAUZE/BANDAGES/DRESSINGS) ×1 IMPLANT
ELECT REM PT RETURN 9FT ADLT (ELECTROSURGICAL) ×1
ELECTRODE REM PT RTRN 9FT ADLT (ELECTROSURGICAL) IMPLANT
GAUZE SPONGE 4X4 12PLY STRL (GAUZE/BANDAGES/DRESSINGS) ×1 IMPLANT
GAUZE XEROFORM 1X8 LF (GAUZE/BANDAGES/DRESSINGS) ×1 IMPLANT
GLOVE BIO SURGEON STRL SZ8 (GLOVE) ×1 IMPLANT
GLOVE BIOGEL PI IND STRL 8 (GLOVE) ×1 IMPLANT
GLOVE BIOGEL PI INDICATOR 8 (GLOVE) ×1
GOWN STRL REUS W/ TWL LRG LVL3 (GOWN DISPOSABLE) ×2 IMPLANT
GOWN STRL REUS W/TWL LRG LVL3 (GOWN DISPOSABLE) ×2
HANDPIECE INTERPULSE COAX TIP (DISPOSABLE) ×1
KIT BASIN OR (CUSTOM PROCEDURE TRAY) ×1 IMPLANT
KIT TURNOVER KIT B (KITS) ×1 IMPLANT
NDL PRECISIONGLIDE 27X1.5 (NEEDLE) ×1 IMPLANT
NEEDLE PRECISIONGLIDE 27X1.5 (NEEDLE) ×1 IMPLANT
NS IRRIG 1000ML POUR BTL (IV SOLUTION) ×1 IMPLANT
PACK ORTHO EXTREMITY (CUSTOM PROCEDURE TRAY) ×1 IMPLANT
PAD ABD 8X10 STRL (GAUZE/BANDAGES/DRESSINGS) IMPLANT
PAD ARMBOARD 7.5X6 YLW CONV (MISCELLANEOUS) ×2 IMPLANT
PAD CAST 4YDX4 CTTN HI CHSV (CAST SUPPLIES) ×1 IMPLANT
PADDING CAST COTTON 4X4 STRL (CAST SUPPLIES) ×1
SET HNDPC FAN SPRY TIP SCT (DISPOSABLE) IMPLANT
SOL PREP POV-IOD 4OZ 10% (MISCELLANEOUS) ×1 IMPLANT
SUT ETHILON 2 0 FS 18 (SUTURE) IMPLANT
SYR CONTROL 10ML LL (SYRINGE) ×1 IMPLANT
TOWEL GREEN STERILE (TOWEL DISPOSABLE) ×1 IMPLANT
TOWEL GREEN STERILE FF (TOWEL DISPOSABLE) ×1 IMPLANT
TUBE CONNECTING 12X1/4 (SUCTIONS) ×1 IMPLANT

## 2022-03-02 NOTE — Anesthesia Preprocedure Evaluation (Addendum)
Anesthesia Evaluation  Patient identified by MRN, date of birth, ID band Patient awake    Reviewed: Allergy & Precautions, H&P , NPO status , Patient's Chart, lab work & pertinent test results  Airway Mallampati: II   Neck ROM: full    Dental   Pulmonary neg pulmonary ROS,    breath sounds clear to auscultation       Cardiovascular hypertension, + CAD and + Past MI   Rhythm:regular Rate:Normal     Neuro/Psych    GI/Hepatic   Endo/Other  diabetes, Poorly Controlled, Type 1  Renal/GU Renal InsufficiencyRenal diseasestones     Musculoskeletal   Abdominal   Peds  Hematology  (+) Blood dyscrasia, anemia , epo deficiency   Anesthesia Other Findings   Reproductive/Obstetrics                            Anesthesia Physical Anesthesia Plan  ASA: 3  Anesthesia Plan: General   Post-op Pain Management:    Induction: Intravenous  PONV Risk Score and Plan: 3 and Ondansetron, Dexamethasone, Midazolam and Treatment may vary due to age or medical condition  Airway Management Planned: LMA  Additional Equipment:   Intra-op Plan:   Post-operative Plan: Extubation in OR  Informed Consent: I have reviewed the patients History and Physical, chart, labs and discussed the procedure including the risks, benefits and alternatives for the proposed anesthesia with the patient or authorized representative who has indicated his/her understanding and acceptance.     Dental advisory given  Plan Discussed with: CRNA, Anesthesiologist and Surgeon  Anesthesia Plan Comments:         Anesthesia Quick Evaluation

## 2022-03-02 NOTE — Interval H&P Note (Signed)
History and Physical Interval Note:  03/02/2022 6:34 PM  Abigail Moran  has presented today for surgery, with the diagnosis of right lower extremity tissue loss.  The various methods of treatment have been discussed with the patient and family. After consideration of risks, benefits and other options for treatment, the patient has consented to  Procedure(s): AMPUTATION OF FOURTH AND FIFTH TOES (Right) as a surgical intervention.  The patient's history has been reviewed, patient examined, no change in status, stable for surgery.  I have reviewed the patient's chart and labs.  Questions were answered to the patient's satisfaction.     Felecia Shelling

## 2022-03-02 NOTE — Progress Notes (Signed)
Pacu RN Report to floor given  Gave report to  [employer]. Room: 4E20   Discussed surgery, meds given in OR and Pacu, VS, IV fluids given, EBL, urine output, pain and other pertinent information. Also discussed if pt had any family or friends here or belongings with them. Discussed R foot up on pillow, ice to area, meds given, sent text to Mom, VSS, bl sugar, urine output w/ pure wick.   Pt exits my care.

## 2022-03-02 NOTE — Anesthesia Procedure Notes (Signed)
Procedure Name: LMA Insertion Date/Time: 03/02/2022 6:48 PM  Performed by: Mayer Camel, CRNAPre-anesthesia Checklist: Patient identified, Emergency Drugs available, Suction available and Patient being monitored Patient Re-evaluated:Patient Re-evaluated prior to induction Oxygen Delivery Method: Circle System Utilized Preoxygenation: Pre-oxygenation with 100% oxygen Induction Type: IV induction Ventilation: Mask ventilation without difficulty LMA: LMA inserted LMA Size: 4.0 Number of attempts: 1 Airway Equipment and Method: Bite block Placement Confirmation: positive ETCO2 Tube secured with: Tape Dental Injury: Teeth and Oropharynx as per pre-operative assessment

## 2022-03-02 NOTE — Plan of Care (Signed)
  Problem: Clinical Measurements: Goal: Will remain free from infection Outcome: Not Progressing   

## 2022-03-02 NOTE — Transfer of Care (Signed)
Immediate Anesthesia Transfer of Care Note  Patient: Abigail Moran  Procedure(s) Performed: AMPUTATION OF FOURTH AND FIFTH TOES, Irrigation and debridment (Right)  Patient Location: PACU  Anesthesia Type:General  Level of Consciousness: awake, alert  and oriented  Airway & Oxygen Therapy: Patient Spontanous Breathing and Patient connected to face mask oxygen  Post-op Assessment: Report given to RN and Post -op Vital signs reviewed and stable  Post vital signs: Reviewed and stable  Last Vitals:  Vitals Value Taken Time  BP    Temp    Pulse 113 03/02/22 1955  Resp 14 03/02/22 1955  SpO2 99 % 03/02/22 1955  Vitals shown include unvalidated device data.  Last Pain:  Vitals:   03/02/22 1758  TempSrc:   PainSc: 0-No pain      Patients Stated Pain Goal: 0 (02/27/22 2202)  Complications: No notable events documented.

## 2022-03-02 NOTE — Progress Notes (Signed)
PROGRESS NOTE    Abigail Moran  OFB:[id number] DOB: Jun 22, 1976 DOA: 02/25/2022 PCP: Sandford Craze, NP   Brief Narrative:  46 y.o. female with medical history significant of uncontrolled type I diabetes, medical noncompliance, gastroparesis, marijuana abuse, erythropoietin deficiency anemia, right foot cellulitis and abscess in 05/2021, DVT, nephrolithiasis, menorrhagia, CKD stage II-IIIa, vitamin D deficiency, CAD, hypertension, hyperlipidemia.  Recently seen in the ED on 8/7 for right foot infection.  X-ray was negative for osteomyelitis and she was discharged with prescription for doxycycline.  She did not take the antibiotic and also noncompliant with insulin.  Returned to the ED with toe pain/worsening infection of her right foot.  She was found to be in DKA.  She was hospitalized for further management.  Podiatry consulted.  Recommend transferring to [facility] for vascular surgery consultation and intervention prior to right fourth and fifth digit amputations.  Consulted vascular surgery for evaluation prior to likely amputation per podiatry.    Assessment & Plan:   Principal Problem:   DKA, type 1 (HCC) Active Problems:   CAD (coronary artery disease)   Erythropoietin deficiency anemia   Hyperlipidemia   Hyponatremia   Sepsis (HCC)   Primary hypertension   Diabetic foot infection (HCC)   Acute-on-chronic kidney injury (HCC)   DKA (diabetic ketoacidosis) (HCC)   Sepsis secondary to diabetic infection right foot/osteomyelitis with abscess right  fourth and fifth digits, sepsis present on admission -Continue broad-spectrum antibiotics with cefepime, vancomycin -Podiatry following tentative plan for right fourth and fifth digit amputations/I&D today at 5 PM -Three-vessel runoff reassuring for adequate blood flow to support amputation -Leukocytosis tachycardia with notable source meet criteria for sepsis, POA -Imaging consistent with osteomyelitis, MRI confirming.   Diabetic  ketoacidosis in the setting of poorly controlled diabetes mellitus type 1, resolved - Anion gap closed, transitioned to subcu insulin and p.o. intake  - Monitor closely while n.p.o. perioperatively, high risk to reopen anion gap  - HbA1c 13.1 implying long-term noncompliance.   - Continue long-acting Semglee 20 units daily, sliding scale insulin and hypoglycemic protocol.  Acute kidney injury on chronic kidney disease stage IIIa Baseline creatinine 1.0.  Presented with creatinine of 1.4.   Status post IV fluids in the setting of DKA, continue fluids while n.p.o. perioperatively  Follow closely given need for contrast today with vascular evaluation   Hypokalemia, ongoing Replace and follow.  Magnesium 2.2.   Hyponatremia Pseudohyponatremia, resolved with correction of glucose   Anemia of chronic kidney disease Hemoglobin stabilizing, likely partially dilutional at intake   Essential hypertension Hold home medications, currently well controlled   Coronary artery disease Without current symptomatology, resume home medications at discharge   Hyperlipidemia Resume statin.   Medical noncompliance Plans was emphasized to the patient.  She however mentions that she does take her insulin regularly.  This will need to be an ongoing conversation with her.      Body mass index is 33.27 kg/m./Obesity  DVT prophylaxis: Heparin Code Status: Full Family Communication: None present  Status is: Inpatient  Dispo: The patient is from: Home              Anticipated d/c is to: Home              Anticipated d/c date is: 48 to 72 hours pending surgical course              Patient currently not medically stable for discharge  Consultants:  Podiatry, vascular  Procedures:  Aortogram, right lower extremity Tentative debridement  versus amputation per podiatry, pending further evaluation  Antimicrobials:  Cefepime, vancomycin, ongoing  Subjective: No acute issues or events overnight  denies nausea vomiting diarrhea constipation any fevers chills or chest pain.  Right foot pain currently well controlled  Objective: Vitals:   03/01/22 2017 03/01/22 2018 03/02/22 0003 03/02/22 0447  BP: 110/79  109/77 132/89  Pulse: (!) 112 (!) 107 (!) 104 100  Resp: 18  18 18   Temp: 100 F (37.8 C)  98.9 F (37.2 C) 98.7 F (37.1 C)  TempSrc: Oral  Oral Oral  SpO2: 100%  100% 98%  Weight:      Height:        Intake/Output Summary (Last 24 hours) at 03/02/2022 0730 Last data filed at 03/02/2022 0219 Gross per 24 hour  Intake 445.29 ml  Output 800 ml  Net -354.71 ml    Filed Weights   02/25/22 1943 02/26/22 0120  Weight: 90.7 kg 92.1 kg    Examination:  General:  Pleasantly resting in bed, No acute distress. HEENT:  Normocephalic atraumatic.  Sclerae nonicteric, noninjected.  Extraocular movements intact bilaterally. Neck:  Without mass or deformity.  Trachea is midline. Lungs:  Clear to auscultate bilaterally without rhonchi, wheeze, or rales. Heart:  Regular rate and rhythm.  Without murmurs, rubs, or gallops. Abdomen:  Soft, nontender, nondistended.  Without guarding or rebound. Extremities: Right foot bandage clean dry intact Vascular:  Dorsalis pedis and posterior tibial pulses palpable bilaterally.   Data Reviewed: I have personally reviewed following labs and imaging studies  CBC: Recent Labs  Lab 02/25/22 2040 02/25/22 2215 02/26/22 0257 02/27/22 0247 02/28/22 0712 03/01/22 0943 03/02/22 0333  WBC 12.9*  --  12.2* 10.1 8.4 8.8 8.8  NEUTROABS 11.1*  --   --   --   --   --   --   HGB 10.2*   < > 9.9* 8.6* 8.1* 8.0* 8.0*  HCT 32.1*   < > 31.1* 28.0* 25.4* 24.7* 24.7*  MCV 88.2  --  88.6 93.3 88.5 85.8 87.6  PLT 304  --  303 255 280 291 318   < > = values in this interval not displayed.    Basic Metabolic Panel: Recent Labs  Lab 02/26/22 0623 02/27/22 0247 02/28/22 0712 03/01/22 0943 03/02/22 0333  NA 132* 129* 133* 137 136  K 3.8 4.0 3.4*  3.1* 4.2  CL 105 106 106 111 109  CO2 17* 16* 20* 20* 21*  GLUCOSE 164* 311* 292* 103* 108*  BUN 16 16 14 9 9   CREATININE 1.25* 1.24* 1.03* 1.02* 1.04*  CALCIUM 7.7* 7.3* 7.5* 8.0* 7.9*  MG  --  2.2  --   --  2.1    GFR: Estimated Creatinine Clearance: 75.8 mL/min (A) (by C-G formula based on SCr of 1.04 mg/dL (H)). Liver Function Tests: Recent Labs  Lab 02/25/22 2230  AST 15  ALT 11  ALKPHOS 130*  BILITOT 1.2  PROT 8.3*  ALBUMIN 2.9*    No results for input(s): "LIPASE", "AMYLASE" in the last 168 hours. No results for input(s): "AMMONIA" in the last 168 hours. Coagulation Profile: No results for input(s): "INR", "PROTIME" in the last 168 hours. Cardiac Enzymes: No results for input(s): "CKTOTAL", "CKMB", "CKMBINDEX", "TROPONINI" in the last 168 hours. BNP (last 3 results) No results for input(s): "PROBNP" in the last 8760 hours. HbA1C: No results for input(s): "HGBA1C" in the last 72 hours. CBG: Recent Labs  Lab 03/01/22 0819 03/01/22 1307 03/01/22 1616 03/01/22 2056 03/02/22  5993  GLUCAP 153* 66* 226* 215* 104*    Lipid Profile: No results for input(s): "CHOL", "HDL", "LDLCALC", "TRIG", "CHOLHDL", "LDLDIRECT" in the last 72 hours. Thyroid Function Tests: No results for input(s): "TSH", "T4TOTAL", "FREET4", "T3FREE", "THYROIDAB" in the last 72 hours. Anemia Panel: No results for input(s): "VITAMINB12", "FOLATE", "FERRITIN", "TIBC", "IRON", "RETICCTPCT" in the last 72 hours. Sepsis Labs: Recent Labs  Lab 02/25/22 2157 02/25/22 2318  LATICACIDVEN 1.1 1.3     Recent Results (from the past 240 hour(s))  Culture, blood (routine x 2)     Status: None (Preliminary result)   Collection Time: 02/25/22  9:58 PM   Specimen: BLOOD LEFT ARM  Result Value Ref Range Status   Specimen Description   Final    BLOOD LEFT ARM Performed at [facility], [address] [place], [place] [address]    Special Requests   Final    BOTTLES DRAWN AEROBIC AND  ANAEROBIC Blood Culture adequate volume Performed at [facility], [address] [place], [place] [address]    Culture   Final    NO GROWTH 3 DAYS Performed at [facility], [address] [place], [place] [address]    Report Status PENDING  Incomplete  Culture, blood (routine x 2)     Status: None (Preliminary result)   Collection Time: 02/25/22 10:29 PM   Specimen: BLOOD RIGHT ARM  Result Value Ref Range Status   Specimen Description   Final    BLOOD RIGHT ARM Performed at [facility], [address] [place], [place] [address]    Special Requests   Final    BOTTLES DRAWN AEROBIC AND ANAEROBIC Blood Culture adequate volume Performed at [facility], [address] [place], [place] [address]    Culture   Final    NO GROWTH 3 DAYS Performed at [facility], [address] [place], [place] [address]    Report Status PENDING  Incomplete  MRSA Next Gen by PCR, Nasal     Status: None   Collection Time: 02/26/22  1:19 AM   Specimen: Nasal Mucosa; Nasal Swab  Result Value Ref Range Status   MRSA by PCR Next Gen NOT DETECTED NOT DETECTED Final    Comment: (NOTE) The GeneXpert MRSA Assay (FDA approved for NASAL specimens only), is one component of a comprehensive MRSA colonization surveillance program. It is not intended to diagnose MRSA infection nor to guide or monitor treatment for MRSA infections. Test performance is not FDA approved in patients less than 31 years old. Performed at [facility], [address], [place], [place] [address]     Radiology Studies: PERIPHERAL VASCULAR CATHETERIZATION  Result Date: 03/01/2022 Patient name: Abigail Moran          MRN: [id number]        DOB: 06-11-1976            Sex: female  03/01/2022 Pre-operative Diagnosis: Right lower extremity tissue loss Post-operative diagnosis:  Same Surgeon:  Cephus Shelling, MD Procedure Performed: 1.  Ultrasound-guided access left  common femoral artery 2.  Aortogram with catheter selection of aorta 3.  Right lower extremity arteriogram with selection of second-order branches 4.  Mynx closure of the left common femoral artery 5.  19 minutes of monitored moderate conscious sedation time  Indications: Patient is a 46 year old female with diabetes that has developed tissue loss in the right foot.  She  is scheduled for toe amputations with podiatry and vascular surgery was asked to evaluate inflow for adequacy of healing.  She presents after risks benefits discussed.  Findings:  Aortogram showed both renal arteries are patent bilaterally without flow limiting stenosis.  Her infrarenal aorta is patent.  Both iliac arteries are widely patent without flow-limiting stenosis.  Right lower extremity arteriogram showed a widely patent common femoral, profunda, SFA, popliteal and three-vessel runoff.  Flow was very brisk into the right foot.             Procedure:  The patient was identified in the holding area and taken to room 8.  The patient was then placed supine on the table and prepped and draped in the usual sterile fashion.  A time out was called.  Ultrasound was used to evaluate the left common femoral artery.  It was patent .  A digital ultrasound image was acquired.  A micropuncture needle was used to access the left common femoral artery under ultrasound guidance.  An 018 wire was advanced without resistance and a micropuncture sheath was placed.  The 018 wire was removed and a benson wire was placed.  The micropuncture sheath was exchanged for a 5 french sheath.  An omniflush catheter was advanced over the wire to the level of L-1.  An abdominal angiogram was obtained.  Next, using the omniflush catheter and a benson wire, the aortic bifurcation was crossed and the catheter was placed into theright external iliac artery and right runoff was obtained.  Pertinent findings are noted above.  No intervention performed.  Wires and catheters were  removed.  Mynx closure device deployed in the left common femoral artery.  Pain: Patient has excellent flow down the right lower extremity with no stenosis and 3 vessel runoff and should have adequate inflow for healing toe amputations.  Marty Heck, MD [facility] Office: [phone]     Scheduled Meds:  Chlorhexidine Gluconate Cloth  6 each Topical Daily   heparin injection (subcutaneous)  5,000 Units Subcutaneous Q8H   insulin aspart  0-15 Units Subcutaneous TID WC   insulin aspart  0-5 Units Subcutaneous QHS   insulin aspart  4 Units Subcutaneous TID WC   insulin glargine-yfgn  20 Units Subcutaneous Daily   sodium chloride flush  3 mL Intravenous Q12H   Continuous Infusions:  sodium chloride 10 mL/hr at 03/01/22 2149   ceFEPime (MAXIPIME) IV 2 g (03/02/22 [id number])   metronidazole 500 mg (03/01/22 2323)   vancomycin Stopped (02/28/22 1845)     LOS: 4 days   Time spent: 1min  Diana Davenport C Phylicia Mcgaugh, DO [facility]  If 7PM-7AM, please contact night-coverage [email or web address]  03/02/2022, 7:30 AM

## 2022-03-02 NOTE — Anesthesia Postprocedure Evaluation (Signed)
Anesthesia Post Note  Patient: Abigail Moran  Procedure(s) Performed: AMPUTATION OF FOURTH AND FIFTH TOES, Irrigation and debridment (Right)     Patient location during evaluation: PACU Anesthesia Type: General Level of consciousness: awake and alert Pain management: pain level controlled Vital Signs Assessment: post-procedure vital signs reviewed and stable Respiratory status: spontaneous breathing, nonlabored ventilation and respiratory function stable Cardiovascular status: stable and blood pressure returned to baseline Anesthetic complications: no   No notable events documented.  Last Vitals:  Vitals:   03/02/22 2025 03/02/22 2048  BP: 134/89 (!) 143/96  Pulse: 96   Resp: 11 18  Temp: 36.7 C 36.6 C  SpO2: 97% 100%    Last Pain:  Vitals:   03/02/22 2048  TempSrc: Oral  PainSc: 0-No pain                 Beryle Lathe

## 2022-03-02 NOTE — Progress Notes (Addendum)
  Progress Note    03/02/2022 7:37 AM 1 Day Post-Op  Subjective:  no complaints   Vitals:   03/02/22 0003 03/02/22 0447  BP: 109/77 132/89  Pulse: (!) 104 100  Resp: 18 18  Temp: 98.9 F (37.2 C) 98.7 F (37.1 C)  SpO2: 100% 98%   Physical Exam: Lungs:  non labored Incisions:  L groin cath site without hematoma Extremities:  palpable R ATA pulse Neurologic: A&O  CBC    Component Value Date/Time   WBC 8.8 03/02/2022 0333   RBC 2.82 (L) 03/02/2022 0333   HGB 8.0 (L) 03/02/2022 0333   HGB 8.7 (L) 10/10/2020 1031   HCT 24.7 (L) 03/02/2022 0333   PLT 318 03/02/2022 0333   PLT 236 10/10/2020 1031   MCV 87.6 03/02/2022 0333   MCH 28.4 03/02/2022 0333   MCHC 32.4 03/02/2022 0333   RDW 14.4 03/02/2022 0333   LYMPHSABS 0.8 02/25/2022 2040   MONOABS 0.8 02/25/2022 2040   EOSABS 0.0 02/25/2022 2040   BASOSABS 0.0 02/25/2022 2040    BMET    Component Value Date/Time   NA 136 03/02/2022 0333   K 4.2 03/02/2022 0333   CL 109 03/02/2022 0333   CO2 21 (L) 03/02/2022 0333   GLUCOSE 108 (H) 03/02/2022 0333   BUN 9 03/02/2022 0333   CREATININE 1.04 (H) 03/02/2022 0333   CREATININE 0.93 05/26/2021 1611   CALCIUM 7.9 (L) 03/02/2022 0333   GFRNONAA >60 03/02/2022 0333   GFRNONAA 53 (L) 10/10/2020 1031   GFRAA >60 03/19/2020 0335    INR    Component Value Date/Time   INR 1.0 03/17/2020 0335     Intake/Output Summary (Last 24 hours) at 03/02/2022 0737 Last data filed at 03/02/2022 0219 Gross per 24 hour  Intake 445.29 ml  Output 800 ml  Net -354.71 ml     Assessment/Plan:  46 y.o. female is s/p RLE arteriogram 1 Day Post-Op   RLE with palpable ATA pulse.  Arteriogram shows RLE without flow limiting stenosis.  Should have adequate blood flow to heal toe amputations.  L groin cath site without hematoma.  Nothing further to offer from vascular standpoint.  Plans noted for toe amps with Podiatry today   Emilie Rutter, PA-C [facility] [phone] 03/02/2022 7:37 AM  I agree with the above.  I have seen and evaluated the patient.  Her left groin cannulation site is without hematoma.  Angiography yesterday showed adequate blood flow to heal toe amputations.  No vascular surgery intervention is required at this point.  We will sign off.  Please contact me with any additional concerns or questions.  Durene Cal

## 2022-03-02 NOTE — Brief Op Note (Signed)
03/02/2022  7:46 PM  PATIENT:  Abigail Moran  46 y.o. female  PRE-OPERATIVE DIAGNOSIS:  right lower extremity tissue loss  POST-OPERATIVE DIAGNOSIS:  right lower extremity tissue loss  PROCEDURE:  Procedure(s): AMPUTATION OF FOURTH AND FIFTH TOES, Irrigation and debridment (Right)  SURGEON:  Surgeon(s) and Role:    Felecia Shelling, DPM - Primary  PHYSICIAN ASSISTANT:   ASSISTANTS: none   ANESTHESIA:   General  EBL: 70mL  BLOOD ADMINISTERED:none  DRAINS: none   LOCAL MEDICATIONS USED:  MARCAINE   48mL  SPECIMEN:  No Specimen  DISPOSITION OF SPECIMEN:  N/A  COUNTS:  YES  TOURNIQUET:   Total Tourniquet Time Documented: Ankle (Right) - 21 minutes Total: Ankle (Right) - 21 minutes   DICTATION: .Reubin Milan Dictation  PLAN OF CARE: Admit to inpatient   PATIENT DISPOSITION:  PACU - hemodynamically stable.   Delay start of Pharmacological VTE agent (>24hrs) due to surgical blood loss or risk of bleeding: no

## 2022-03-03 ENCOUNTER — Other Ambulatory Visit: Payer: Self-pay

## 2022-03-03 DIAGNOSIS — M86671 Other chronic osteomyelitis, right ankle and foot: Secondary | ICD-10-CM

## 2022-03-03 LAB — BASIC METABOLIC PANEL
Anion gap: 7 (ref 5–15)
BUN: 10 mg/dL (ref 6–20)
CO2: 23 mmol/L (ref 22–32)
Calcium: 8 mg/dL — ABNORMAL LOW (ref 8.9–10.3)
Chloride: 100 mmol/L (ref 98–111)
Creatinine, Ser: 1.04 mg/dL — ABNORMAL HIGH (ref 0.44–1.00)
GFR, Estimated: >60 mL/min (ref >60)
Glucose, Bld: 431 mg/dL — ABNORMAL HIGH (ref 70–99)
Potassium: 4.3 mmol/L (ref 3.5–5.1)
Sodium: 130 mmol/L — ABNORMAL LOW (ref 135–145)

## 2022-03-03 LAB — CBC
HCT: 28.5 % — ABNORMAL LOW (ref 36.0–46.0)
Hemoglobin: 9.1 g/dL — ABNORMAL LOW (ref 12.0–15.0)
MCH: 28.3 pg (ref 26.0–34.0)
MCHC: 31.9 g/dL (ref 30.0–36.0)
MCV: 88.5 fL (ref 80.0–100.0)
Platelets: 306 10*3/uL (ref 150–400)
RBC: 3.22 MIL/uL — ABNORMAL LOW (ref 3.87–5.11)
RDW: 14.5 % (ref 11.5–15.5)
WBC: 7.4 10*3/uL (ref 4.0–10.5)
nRBC: 0 % (ref 0.0–0.2)

## 2022-03-03 LAB — GLUCOSE, CAPILLARY
Glucose-Capillary: 390 mg/dL — ABNORMAL HIGH (ref 70–99)
Glucose-Capillary: 153 mg/dL — ABNORMAL HIGH (ref 70–99)
Glucose-Capillary: 104 mg/dL — ABNORMAL HIGH (ref 70–99)
Glucose-Capillary: 151 mg/dL — ABNORMAL HIGH (ref 70–99)

## 2022-03-03 LAB — CULTURE, BLOOD (ROUTINE X 2)
Culture: NO GROWTH
Culture: NO GROWTH
Special Requests: ADEQUATE
Special Requests: ADEQUATE

## 2022-03-03 LAB — VANCOMYCIN, PEAK: Vancomycin Pk: 30 ug/mL (ref 30–40)

## 2022-03-03 MED ORDER — MORPHINE SULFATE (PF) 2 MG/ML IV SOLN
2.0000 mg | INTRAVENOUS | Status: DC | PRN
  Administered 2022-03-03 (×4): 2 mg via INTRAVENOUS
  Filled 2022-03-03 (×4): qty 1

## 2022-03-03 NOTE — Plan of Care (Signed)
  Problem: Health Behavior/Discharge Planning: Goal: Ability to manage health-related needs will improve Outcome: Progressing   Problem: Clinical Measurements: Goal: Will remain free from infection Outcome: Progressing   

## 2022-03-03 NOTE — Progress Notes (Signed)
Pharmacy Antibiotic Note  Abigail Moran is a 46 y.o. female admitted on 02/25/2022 presenting with worsening toe infection with drainage, concern for diabetic foot infection. Pharmacy has been consulted for vancomycin and cefepime dosing.  Patient s/p amputation, irrigation and debridement of 4th and 5th toes. Noted to have serosanguineous drainage with minimal purulence. Unclear if clean margins. Patient will likely need additional OR visit for washout with possible plan for Monday. Per discussion with Dr. Natale Milch will continue current antibiotic regimen for now. Scr 1.04 at baseline. Afebrile with white count wnl.  Plan: Continue vancomycin 1250 mg IV q24h Continue cefepime 2g IV q8h Continue metronidazole 500 mg IV q12h Obtaining vancomycin peak and trough levels for goal AUC 400-550  Monitor renal function, culture results, clinical status and resolution of s/sx infection Narrow abx as able and f/u with appropriate duration   Plan: - Increase vancomycin dose to vancomycin 1250 mg IV q24h (eAUC 495, SCr 1.03) -Continue cefepime 2 g q8h -Continue Flagyl per MD - Expected transfer to [facility] today and amputation tomorrow - Monitor clinical progression - Monitor WBC, temp, SCr  Height: 5' 5.5" (166.4 cm) Weight: 92.1 kg (203 lb 0.7 oz) IBW/kg (Calculated) : 58.15  Temp (24hrs), Avg:98 F (36.7 C), Min:97.2 F (36.2 C), Max:98.8 F (37.1 C)  Recent Labs  Lab 02/25/22 2157 02/25/22 2318 02/26/22 0257 02/27/22 0247 02/28/22 0712 03/01/22 0943 03/02/22 0333 03/03/22 0431  WBC  --   --    < > 10.1 8.4 8.8 8.8 7.4  CREATININE  --   --    < > 1.24* 1.03* 1.02* 1.04* 1.04*  LATICACIDVEN 1.1 1.3  --   --   --   --   --   --    < > = values in this interval not displayed.     Estimated Creatinine Clearance: 75.8 mL/min (A) (by C-G formula based on SCr of 1.04 mg/dL (H)).    Allergies  Allergen Reactions   Latex Itching    Tears skin   Tape Hives   Antimicrobials:   Cefepime 2 g IV q8h 8/20>> Vancomycin 1250 mg IV daily 8/20>> Metronidazole 500 mg IV q12h 8/20>>  Microbiology results: 8/20 Bcx x2: NGTD x5 8/21 MRSA PCR: negative 8/25 Wound Cx: pending  8/25 Fungus Cx: sent   Thank you for allowing pharmacy to be a part of this patient's care.    Jerry Caras, PharmD PGY1 Pharmacy Resident   03/03/2022 1:29 PM

## 2022-03-03 NOTE — Op Note (Signed)
OPERATIVE REPORT Patient name: Abigail Moran MRN: [id number] DOB: 30-Jul-1975  DOS: 03/03/22  Preop Dx: Osteomyelitis with abscess right fourth and fifth digits Postop Dx: same  Procedure:  1.  Amputation right fourth and fifth digits 2.  Incision and drainage right foot  Surgeon: Felecia Shelling DPM  Anesthesia: General anesthesia  Hemostasis: Calf tourniquet inflated to a pressure of after esmarch exsanguination   EBL: 50 mL Materials: Salera Mini-Membrane 40mg , Allograft Placental Membrane Injectables: 20 mL of 0.5% Marcaine plain infiltrated in an ankle block fashion Pathology: Cultures taken and sent to pathology for culture and sensitivities  Condition: The patient tolerated the procedure and anesthesia well. No complications noted or reported   Justification for procedure: The patient is a 46 y.o. female who presents today for surgical correction of osteomyelitis to the fourth and fifth digits of the right foot as well as abscess.. All conservative modalities of been unsuccessful in providing any sort of satisfactory alleviation of symptoms with the patient. The patient was told benefits as well as possible side effects of the surgery. The patient consented for surgical correction. The patient consent form was reviewed. All patient questions were answered. No guarantees were expressed or implied. The patient and the surgeon both signed the patient consent form with the witness present and placed in the patient's chart.   Procedure in Detail: The patient was brought to the operating room, placed in the operating table in the supine position at which time an aseptic scrub and drape were performed about the patient's respective lower extremity after anesthesia was induced as described above. Attention was then directed to the surgical area where procedure number one commenced.  Procedure #1: Amputation fourth and fifth digits right foot A racquet type incision was  planned and made about the fourth and fifth digits of the right foot.  Incision was carried down to the level of the MTP joint using a #15 scalpel and the toes were disarticulated and removed in toto and sent for discard.  After removal of the fourth and fifth digits of the right foot intraoperative decision was made to remove the fourth and fifth metatarsal heads which appeared prominent and impeded closure of the amputation site.  A sagittal saw was utilized to resect away the metatarsal heads at the neck of the metatarsals fourth and fifth digits to allow for better closure of the amputation site.  After amputation of the fourth and fifth digits there was proximal tracking with heavy amount of purulent drainage coming mostly from the plantar compartments of the foot.  Attention was directed to the plantar soft tissue compartments of the foot where procedure #2 commenced  Procedure #2: Incision and drainage with irrigation and debridement right foot Blunt soft tissue dissection was performed from the amputation site of the fourth and fifth digits proximal along the plantar compartments of the foot.  Heavy amount of purulence was expressed from the amputation site.  Infection had traveled proximal along the plantar compartments of the foot.  Nonviable tissue was noted as well.  Excisional debridement of the nonviable tissue was performed using a #15 scalpel.  The tourniquet was deflated at this time to allow for healthy bleeding while copious irrigation using pulse lavage and 3 L of normal saline was also utilized.  Pulse lavage was inserted along the plantar compartments of the foot and copious irrigation was performed.  After copious irrigation and debridement the tissues appeared much healthier with healthy bleeding.  Salera Mini-Membrane was placed  within the amputation site and 2 retention sutures using 2-0 nylon were also placed.  With the exception of the 2 retention sutures the amputation site was  mostly left open to allow for draining.  Adaptic followed by saline wet-to-dry compressive dressings were then applied to all previously mentioned incision sites about the patient's lower extremity.   The patient was then transferred from the operating room to the recovery room having tolerated the procedure and anesthesia well. All vital signs are stable. After a brief stay in the recovery room the patient was readmitted to inpatient room with postoperative orders placed   IMPRESSION: Abscess with purulence extending along the plantar compartments of the foot.  Patient may need return to the OR for additional washout and delayed primary closure.  Podiatry will continue to follow  Felecia Shelling, DPM [facility]  Dr. Felecia Shelling, DPM    [address] [place], [place] [address]                Office [phone]  Fax [phone]

## 2022-03-03 NOTE — Progress Notes (Signed)
Subjective: 1 Day Post-Op Procedure(s) (LRB): AMPUTATION OF FOURTH AND FIFTH TOES, Irrigation and debridment (Right) DOS: 03/02/2022  Patient resting comfortably in bed.  Pain is managed.  Dressings are intact.  Objective: Vital signs in last 24 hours: Temp:  [97.2 F (36.2 C)-98.8 F (37.1 C)] 97.6 F (36.4 C) (08/26 1141) Pulse Rate:  [96-117] 97 (08/26 1141) Resp:  [8-22] 18 (08/26 1141) BP: (89-145)/(59-106) 98/66 (08/26 1229) SpO2:  [91 %-100 %] 98 % (08/26 1141)  Recent Labs    03/01/22 0943 03/02/22 0333 03/03/22 0431  HGB 8.0* 8.0* 9.1*   Recent Labs    03/02/22 0333 03/03/22 0431  WBC 8.8 7.4  RBC 2.82* 3.22*  HCT 24.7* 28.5*  PLT 318 306   Recent Labs    03/02/22 0333 03/03/22 0431  NA 136 130*  K 4.2 4.3  CL 109 100  CO2 21* 23  BUN 9 10  CREATININE 1.04* 1.04*  GLUCOSE 108* 431*  CALCIUM 7.9* 8.0*         Serosanguineous drainage noted.  Minimal purulence.  Retention sutures are intact.  Tenderness with palpation throughout the plantar arch of the foot.  No malodor.   Assessment/Plan: 1 Day Post-Op Procedure(s) (LRB): AMPUTATION OF FOURTH AND FIFTH TOES, Irrigation and debridment (Right) DOS: 03/02/2022 POV #1 -Dressings changed.  Will plan for daily dressing change by surgeon -Patient will likely need return to the OR with additional washout and delayed primary closure plan for possible Monday -Continue IV antibiotics Maxipime and Flagyl as per order -Cultures pending.  Currently no growth.  Expected since the patient has already been on strong IV ABX since admission  -Will continue to follow    Felecia Shelling 03/03/2022, 1:14 PM

## 2022-03-03 NOTE — Progress Notes (Signed)
PROGRESS NOTE    Abigail Moran  CHY:[id number] DOB: 05-16-1976 DOA: 02/25/2022 PCP: Sandford Craze, NP   Brief Narrative:  46 y.o. female with medical history significant of uncontrolled type I diabetes, medical noncompliance, gastroparesis, marijuana abuse, erythropoietin deficiency anemia, right foot cellulitis and abscess in 05/2021, DVT, nephrolithiasis, menorrhagia, CKD stage II-IIIa, vitamin D deficiency, CAD, hypertension, hyperlipidemia. Recently seen in the ED on 8/7 for right foot infection.  X-ray was negative for osteomyelitis and she was discharged with prescription for doxycycline.  She did not take the antibiotic and also noncompliant with insulin.  Returned to the ED with toe pain/worsening infection of her right foot. She was found to be in DKA.  She was hospitalized for further management. Podiatry consulted. Recommend transferring to [facility] for vascular surgery consultation and intervention prior to right fourth and fifth digit amputations. Consulted vascular surgery for evaluation (no further work-up per imaging) prior to amputation per podiatry.    Assessment & Plan:   Principal Problem:   DKA, type 1 (HCC) Active Problems:   CAD (coronary artery disease)   Erythropoietin deficiency anemia   Hyperlipidemia   Hyponatremia   Sepsis (HCC)   Primary hypertension   Diabetic foot infection (HCC)   Acute-on-chronic kidney injury (HCC)   DKA (diabetic ketoacidosis) (HCC)   Sepsis secondary to diabetic infection right foot/osteomyelitis with abscess right  fourth and fifth digits, sepsis present on admission -Continue broad-spectrum antibiotics with cefepime, vancomycin -Podiatry following s/p 4/5th amputation R toes/I&D - tolerated well  -May require additional washout and delayed primary closure per podiatry -Three-vessel runoff reassuring for adequate blood flow to support amputation -Leukocytosis tachycardia with notable source meet criteria for sepsis,  POA(resolving) -Imaging consistent with osteomyelitis, MRI confirmed.   Diabetic ketoacidosis in the setting of poorly controlled diabetes mellitus type 1, resolved - Anion gap closed, transitioned to subcu insulin and p.o. intake  - Monitor closely while n.p.o. perioperatively  - HbA1c 13.1 implying long-term and profound noncompliance.   - Continue long-acting Semglee 20 units daily, sliding scale insulin and hypoglycemic protocol -Glucose continues to be quite labile upwards of 400 overnight  Acute kidney injury on chronic kidney disease stage IIIa, resolving Baseline creatinine 1.0.  Presented with creatinine of 1.4.   Resolved, at baseline   Hypokalemia, resolved Continue to follow   Pseudohyponatremia Resolved with correction of glucose   Anemia of chronic kidney disease Hemoglobin stabilizing, likely partially dilutional at intake   Essential hypertension Hold home medications, currently well controlled   Coronary artery disease Without current symptomatology, resume home medications at discharge   Hyperlipidemia Resume statin.   Medical noncompliance Plans was emphasized to the patient.  She however mentions that she does take her insulin regularly.  This will need to be an ongoing conversation with her.      Morbid obesity Body mass index is 33.27 kg/m.  DVT prophylaxis: Heparin Code Status: Full Family Communication: None present  Status is: Inpatient  Dispo: The patient is from: Home              Anticipated d/c is to: Home              Anticipated d/c date is: 48 to 72 hours pending surgical course              Patient currently not medically stable for discharge  Consultants:  Podiatry, vascular  Procedures:  Aortogram, right lower extremity Tentative debridement versus amputation per podiatry, pending further evaluation  Antimicrobials:  Cefepime, vancomycin,  ongoing  Subjective: No acute issues or events overnight denies nausea vomiting  diarrhea constipation any fevers chills or chest pain.  Right foot pain currently well controlled  Objective: Vitals:   03/02/22 2025 03/02/22 2048 03/03/22 0000 03/03/22 0457  BP: 134/89 (!) 143/96 (!) 143/95 135/79  Pulse: 96  100 100  Resp: 11 18 18 18   Temp: 98 F (36.7 C) 97.9 F (36.6 C) 98.2 F (36.8 C) 97.8 F (36.6 C)  TempSrc:  Oral Oral Oral  SpO2: 97% 100% 98% 100%  Weight:      Height:        Intake/Output Summary (Last 24 hours) at 03/03/2022 0727 Last data filed at 03/03/2022 0458 Gross per 24 hour  Intake 1473.83 ml  Output 2610 ml  Net -1136.17 ml    Filed Weights   02/25/22 1943 02/26/22 0120  Weight: 90.7 kg 92.1 kg    Examination:  General:  Pleasantly resting in bed, No acute distress. HEENT:  Normocephalic atraumatic.  Sclerae nonicteric, noninjected.  Extraocular movements intact bilaterally. Neck:  Without mass or deformity.  Trachea is midline. Lungs:  Clear to auscultate bilaterally without rhonchi, wheeze, or rales. Heart:  Regular rate and rhythm.  Without murmurs, rubs, or gallops. Abdomen:  Soft, nontender, nondistended.  Without guarding or rebound. Extremities: Right foot bandage clean dry intact  Data Reviewed: I have personally reviewed following labs and imaging studies  CBC: Recent Labs  Lab 02/25/22 2040 02/25/22 2215 02/27/22 0247 02/28/22 0712 03/01/22 0943 03/02/22 0333 03/03/22 0431  WBC 12.9*   < > 10.1 8.4 8.8 8.8 7.4  NEUTROABS 11.1*  --   --   --   --   --   --   HGB 10.2*   < > 8.6* 8.1* 8.0* 8.0* 9.1*  HCT 32.1*   < > 28.0* 25.4* 24.7* 24.7* 28.5*  MCV 88.2   < > 93.3 88.5 85.8 87.6 88.5  PLT 304   < > 255 280 291 318 306   < > = values in this interval not displayed.    Basic Metabolic Panel: Recent Labs  Lab 02/27/22 0247 02/28/22 0712 03/01/22 0943 03/02/22 0333 03/03/22 0431  NA 129* 133* 137 136 130*  K 4.0 3.4* 3.1* 4.2 4.3  CL 106 106 111 109 100  CO2 16* 20* 20* 21* 23  GLUCOSE 311* 292*  103* 108* 431*  BUN 16 14 9 9 10   CREATININE 1.24* 1.03* 1.02* 1.04* 1.04*  CALCIUM 7.3* 7.5* 8.0* 7.9* 8.0*  MG 2.2  --   --  2.1  --     GFR: Estimated Creatinine Clearance: 75.8 mL/min (A) (by C-G formula based on SCr of 1.04 mg/dL (H)). Liver Function Tests: Recent Labs  Lab 02/25/22 2230  AST 15  ALT 11  ALKPHOS 130*  BILITOT 1.2  PROT 8.3*  ALBUMIN 2.9*    No results for input(s): "LIPASE", "AMYLASE" in the last 168 hours. No results for input(s): "AMMONIA" in the last 168 hours. Coagulation Profile: No results for input(s): "INR", "PROTIME" in the last 168 hours. Cardiac Enzymes: No results for input(s): "CKTOTAL", "CKMB", "CKMBINDEX", "TROPONINI" in the last 168 hours. BNP (last 3 results) No results for input(s): "PROBNP" in the last 8760 hours. HbA1C: No results for input(s): "HGBA1C" in the last 72 hours. CBG: Recent Labs  Lab 03/02/22 1639 03/02/22 1836 03/02/22 2003 03/02/22 2103 03/03/22 0602  GLUCAP 104* 116* 126* 115* 390*    Lipid Profile: No results for input(s): "CHOL", "  HDL", "LDLCALC", "TRIG", "CHOLHDL", "LDLDIRECT" in the last 72 hours. Thyroid Function Tests: No results for input(s): "TSH", "T4TOTAL", "FREET4", "T3FREE", "THYROIDAB" in the last 72 hours. Anemia Panel: No results for input(s): "VITAMINB12", "FOLATE", "FERRITIN", "TIBC", "IRON", "RETICCTPCT" in the last 72 hours. Sepsis Labs: Recent Labs  Lab 02/25/22 2157 02/25/22 2318  LATICACIDVEN 1.1 1.3     Recent Results (from the past 240 hour(s))  Culture, blood (routine x 2)     Status: None (Preliminary result)   Collection Time: 02/25/22  9:58 PM   Specimen: BLOOD LEFT ARM  Result Value Ref Range Status   Specimen Description   Final    BLOOD LEFT ARM Performed at [facility], [address] [place], [place] [address]    Special Requests   Final    BOTTLES DRAWN AEROBIC AND ANAEROBIC Blood Culture adequate volume Performed at [facility], [address], [place], [place] [address]    Culture   Final    NO GROWTH 4 DAYS Performed at [facility], [address], [place], [place] [address]    Report Status PENDING  Incomplete  Culture, blood (routine x 2)     Status: None (Preliminary result)   Collection Time: 02/25/22 10:29 PM   Specimen: BLOOD RIGHT ARM  Result Value Ref Range Status   Specimen Description   Final    BLOOD RIGHT ARM Performed at [facility], [address] [place], [place] [address]    Special Requests   Final    BOTTLES DRAWN AEROBIC AND ANAEROBIC Blood Culture adequate volume Performed at [facility], [address] [place], [place] [address]    Culture   Final    NO GROWTH 4 DAYS Performed at [facility], [address], [place], [place] [address]    Report Status PENDING  Incomplete  MRSA Next Gen by PCR, Nasal     Status: None   Collection Time: 02/26/22  1:19 AM   Specimen: Nasal Mucosa; Nasal Swab  Result Value Ref Range Status   MRSA by PCR Next Gen NOT DETECTED NOT DETECTED Final    Comment: (NOTE) The GeneXpert MRSA Assay (FDA approved for NASAL specimens only), is one component of a comprehensive MRSA colonization surveillance program. It is not intended to diagnose MRSA infection nor to guide or monitor treatment for MRSA infections. Test performance is not FDA approved in patients less than 8 years old. Performed at [facility], [address] [place], [place] [address]   Aerobic/Anaerobic Culture w Gram Stain (surgical/deep wound)     Status: None (Preliminary result)   Collection Time: 03/02/22  7:16 PM   Specimen: Foot, Right; Abscess  Result Value Ref Range Status   Specimen Description ABSCESS RIGHT FOOT  Final   Special Requests NONE  Final   Gram Stain   Final    RARE WBC PRESENT,BOTH PMN AND MONONUCLEAR RARE GRAM POSITIVE COCCI IN PAIRS Performed at [facility], [address], [place], [place]  [address]    Culture PENDING  Incomplete   Report Status PENDING  Incomplete    Radiology Studies: DG Foot Complete Right  Result Date: 03/02/2022 CLINICAL DATA:  Postoperative. EXAM: RIGHT FOOT COMPLETE - 3+ VIEW COMPARISON:  Right foot x-ray 02/25/2022 FINDINGS: There has been interval amputation of the distal fourth and fifth metatarsals and phalanges. Surgical margin appears normal. No fracture or dislocation. There  is mild hallux valgus. There is soft tissue swelling of the forefoot. IMPRESSION: 1. Status post amputation of the distal fourth and fifth metatarsals in phalanges. Electronically Signed   By: Ronney Asters M.D.   On: 03/02/2022 22:58   PERIPHERAL VASCULAR CATHETERIZATION  Result Date: 03/01/2022 Patient name: Kerryann Marrero          MRN: [id number]        DOB: 1976/02/10            Sex: female  03/01/2022 Pre-operative Diagnosis: Right lower extremity tissue loss Post-operative diagnosis:  Same Surgeon:  Marty Heck, MD Procedure Performed: 1.  Ultrasound-guided access left common femoral artery 2.  Aortogram with catheter selection of aorta 3.  Right lower extremity arteriogram with selection of second-order branches 4.  Mynx closure of the left common femoral artery 5.  19 minutes of monitored moderate conscious sedation time  Indications: Patient is a 46 year old female with diabetes that has developed tissue loss in the right foot.  She is scheduled for toe amputations with podiatry and vascular surgery was asked to evaluate inflow for adequacy of healing.  She presents after risks benefits discussed.  Findings:  Aortogram showed both renal arteries are patent bilaterally without flow limiting stenosis.  Her infrarenal aorta is patent.  Both iliac arteries are widely patent without flow-limiting stenosis.  Right lower extremity arteriogram showed a widely patent common femoral, profunda, SFA, popliteal and three-vessel runoff.  Flow was very brisk into the right foot.              Procedure:  The patient was identified in the holding area and taken to room 8.  The patient was then placed supine on the table and prepped and draped in the usual sterile fashion.  A time out was called.  Ultrasound was used to evaluate the left common femoral artery.  It was patent .  A digital ultrasound image was acquired.  A micropuncture needle was used to access the left common femoral artery under ultrasound guidance.  An 018 wire was advanced without resistance and a micropuncture sheath was placed.  The 018 wire was removed and a benson wire was placed.  The micropuncture sheath was exchanged for a 5 french sheath.  An omniflush catheter was advanced over the wire to the level of L-1.  An abdominal angiogram was obtained.  Next, using the omniflush catheter and a benson wire, the aortic bifurcation was crossed and the catheter was placed into theright external iliac artery and right runoff was obtained.  Pertinent findings are noted above.  No intervention performed.  Wires and catheters were removed.  Mynx closure device deployed in the left common femoral artery.  Pain: Patient has excellent flow down the right lower extremity with no stenosis and 3 vessel runoff and should have adequate inflow for healing toe amputations.  Marty Heck, MD [facility] Office: [phone]     Scheduled Meds:  Chlorhexidine Gluconate Cloth  6 each Topical Daily   fentaNYL       heparin injection (subcutaneous)  5,000 Units Subcutaneous Q8H   insulin aspart  0-15 Units Subcutaneous TID WC   insulin aspart  0-5 Units Subcutaneous QHS   insulin aspart  4 Units Subcutaneous TID WC   insulin glargine-yfgn  20 Units Subcutaneous Daily   sodium chloride flush  3 mL Intravenous Q12H   Continuous Infusions:  sodium chloride 10 mL/hr at 03/01/22 2149   ceFEPime (MAXIPIME) IV  2 g (03/03/22 0558)   metronidazole 500 mg (03/02/22 2321)   vancomycin 1,250 mg (03/02/22 1218)      LOS: 5 days   Time spent: 50min  Britain Saber C Ellaree Gear, DO [facility]  If 7PM-7AM, please contact night-coverage [email or web address]  03/03/2022, 7:27 AM

## 2022-03-04 LAB — BASIC METABOLIC PANEL
Anion gap: 11 (ref 5–15)
BUN: 11 mg/dL (ref 6–20)
CO2: 20 mmol/L — ABNORMAL LOW (ref 22–32)
Calcium: 7.9 mg/dL — ABNORMAL LOW (ref 8.9–10.3)
Chloride: 102 mmol/L (ref 98–111)
Creatinine, Ser: 1.11 mg/dL — ABNORMAL HIGH (ref 0.44–1.00)
GFR, Estimated: >60 mL/min (ref >60)
Glucose, Bld: 257 mg/dL — ABNORMAL HIGH (ref 70–99)
Potassium: 3.7 mmol/L (ref 3.5–5.1)
Sodium: 133 mmol/L — ABNORMAL LOW (ref 135–145)

## 2022-03-04 LAB — CBC
HCT: 23.9 % — ABNORMAL LOW (ref 36.0–46.0)
Hemoglobin: 7.6 g/dL — ABNORMAL LOW (ref 12.0–15.0)
MCH: 28.1 pg (ref 26.0–34.0)
MCHC: 31.8 g/dL (ref 30.0–36.0)
MCV: 88.5 fL (ref 80.0–100.0)
Platelets: 313 10*3/uL (ref 150–400)
RBC: 2.7 MIL/uL — ABNORMAL LOW (ref 3.87–5.11)
RDW: 14.4 % (ref 11.5–15.5)
WBC: 8 10*3/uL (ref 4.0–10.5)
nRBC: 0 % (ref 0.0–0.2)

## 2022-03-04 LAB — HEMOGLOBIN AND HEMATOCRIT, BLOOD
HCT: 27.4 % — ABNORMAL LOW (ref 36.0–46.0)
Hemoglobin: 8.7 g/dL — ABNORMAL LOW (ref 12.0–15.0)

## 2022-03-04 LAB — GLUCOSE, CAPILLARY
Glucose-Capillary: 179 mg/dL — ABNORMAL HIGH (ref 70–99)
Glucose-Capillary: 195 mg/dL — ABNORMAL HIGH (ref 70–99)
Glucose-Capillary: 236 mg/dL — ABNORMAL HIGH (ref 70–99)
Glucose-Capillary: 246 mg/dL — ABNORMAL HIGH (ref 70–99)
Glucose-Capillary: 245 mg/dL — ABNORMAL HIGH (ref 70–99)

## 2022-03-04 LAB — VANCOMYCIN, TROUGH: Vancomycin Tr: 10 ug/mL — ABNORMAL LOW (ref 15–20)

## 2022-03-04 MED ORDER — OXYCODONE-ACETAMINOPHEN 5-325 MG PO TABS
1.0000 | ORAL_TABLET | ORAL | Status: DC | PRN
  Administered 2022-03-05: 2 via ORAL
  Filled 2022-03-04: qty 2

## 2022-03-04 NOTE — H&P (View-Only) (Signed)
    Subjective: POV #2:  AMPUTATION OF FOURTH AND FIFTH TOES, Irrigation and debridment (Right) DOS: 03/02/2022  Patient resting comfortably in bed.  Patient states the pain is improving.  Dressings intact  Objective: Vital signs in last 24 hours: Temp:  [97.2 F (36.2 C)-98.8 F (37.1 C)] 97.6 F (36.4 C) (08/26 1141) Pulse Rate:  [96-117] 97 (08/26 1141) Resp:  [8-22] 18 (08/26 1141) BP: (89-145)/(59-106) 98/66 (08/26 1229) SpO2:  [91 %-100 %] 98 % (08/26 1141)  CBC    Component Value Date/Time   WBC 8.0 03/04/2022 0224   RBC 2.70 (L) 03/04/2022 0224   HGB 7.6 (L) 03/04/2022 0224   HGB 8.7 (L) 10/10/2020 1031   HCT 23.9 (L) 03/04/2022 0224   PLT 313 03/04/2022 0224   PLT 236 10/10/2020 1031   MCV 88.5 03/04/2022 0224   MCH 28.1 03/04/2022 0224   MCHC 31.8 03/04/2022 0224   RDW 14.4 03/04/2022 0224   LYMPHSABS 0.8 02/25/2022 2040   MONOABS 0.8 02/25/2022 2040   EOSABS 0.0 02/25/2022 2040   BASOSABS 0.0 02/25/2022 2040      Latest Ref Rng & Units 03/04/2022    2:24 AM 03/03/2022    4:31 AM 03/02/2022    3:33 AM  BMP  Glucose 70 - 99 mg/dL 024  097  353   BUN 6 - 20 mg/dL 11  10  9    Creatinine 0.44 - 1.00 mg/dL  2.99  2.42   Sodium 135 - 145 mmol/L 133  130  136   Potassium 3.5 - 5.1 mmol/L 3.7  4.3  4.2   Chloride 98 - 111 mmol/L 102  100  109   CO2 22 - 32 mmol/L 20  23  21    Calcium 8.9 - 10.3 mg/dL 7.9  8.0  7.9      Foot appears stable with improvement.  Serosanguineous drainage noted.  Minimal purulence.  Retention sutures are intact.  Approved tenderness with palpation throughout the plantar arch of the foot.  No malodor.   Assessment/Plan: 1 Day Post-Op Procedure(s) (LRB): AMPUTATION OF FOURTH AND FIFTH TOES, Irrigation and debridment (Right) DOS: 03/02/2022 POV #1 -Dressings changed.   -Planning for return to the OR tomorrow, 03/05/2022, after 5 PM for repeat irrigation and debridement with delayed primary closure RT foot.  N.p.o. after full  breakfast.  Preoperative orders placed -Continue IV antibiotics Maxipime and Flagyl as per order -Cultures still pending.  RARE GRAM POSITIVE COCCI IN PAIRS. RARE GROUP B  STREP(S.AGALACTIAE)ISOLATED  TESTING AGAINST S. AGALACTIAE NOT ROUTINELY PERFORMED DUE TO PREDICTABILITY OF AMP/PEN/VAN SUSCEPTIBILITY.  WITHIN MIXED ORGANISMS  -Will continue to follow   03/04/2022, DPM [facility]  Dr. 03/07/2022, DPM    [address] [employer], [address] [place]                Office [phone]  Fax [phone]

## 2022-03-04 NOTE — Progress Notes (Signed)
Pharmacy Antibiotic Note  Abigail Moran is a 46 y.o. female admitted on 02/25/2022 presenting with worsening toe infection with drainage, concern for diabetic foot infection. Pharmacy has been consulted for vancomycin and cefepime dosing.  Patient s/p amputation, irrigation and debridement of 4th and 5th toes. Noted to have serosanguineous drainage with minimal purulence. Unclear if clean margins. Patient will likely need additional OR visit for washout with possible plan for Monday. Per discussion with Dr. Natale Milch will continue current antibiotic regimen for now. Scr 1.04 at baseline. Afebrile with white count wnl.  Vanc peak 30, vanc trough 10. Estimated AUC at current dose ~476. Level therapeutic. Continue with current vancomycin dosing.   Plan: Continue vancomycin 1250 mg IV q24h Continue cefepime 2g IV q8h Continue metronidazole 500 mg IV q12h Monitor renal function, culture results, clinical status and resolution of s/sx infection Obtain vancomycin levels as appropriate  Narrow abx as able and f/u with appropriate duration   Height: 5' 5.5" (166.4 cm) Weight: 92.1 kg (203 lb 0.7 oz) IBW/kg (Calculated) : 58.15  Temp (24hrs), Avg:98.2 F (36.8 C), Min:97.5 F (36.4 C), Max:98.5 F (36.9 C)  Recent Labs  Lab 02/25/22 2157 02/25/22 2318 02/26/22 0257 02/28/22 0712 03/01/22 0943 03/02/22 0333 03/03/22 0431 03/03/22 1528 03/04/22 0224 03/04/22 0934  WBC  --   --    < > 8.4 8.8 8.8 7.4  --  8.0  --   CREATININE  --   --    < > 1.03* 1.02* 1.04* 1.04*  --  1.11*  --   LATICACIDVEN 1.1 1.3  --   --   --   --   --   --   --   --   VANCOTROUGH  --   --   --   --   --   --   --   --   --  10*  VANCOPEAK  --   --   --   --   --   --   --  30  --   --    < > = values in this interval not displayed.     Estimated Creatinine Clearance: 71 mL/min (A) (by C-G formula based on SCr of 1.11 mg/dL (H)).    Allergies  Allergen Reactions   Latex Itching    Tears skin   Tape Hives    Antimicrobials:  Cefepime 2 g IV q8h 8/20>> Vancomycin 1250 mg IV daily 8/20>> Metronidazole 500 mg IV q12h 8/20>>  Microbiology results: 8/20 Bcx x2: NGTD x5 8/21 MRSA PCR: negative 8/25 Wound Cx: rare GBS  8/25 Fungus Cx: sent   Thank you for allowing pharmacy to be a part of this patient's care.    Jerry Caras, PharmD PGY1 Pharmacy Resident   03/04/2022 2:55 PM

## 2022-03-04 NOTE — Progress Notes (Signed)
    Subjective: POV #2:  AMPUTATION OF FOURTH AND FIFTH TOES, Irrigation and debridment (Right) DOS: 03/02/2022  Patient resting comfortably in bed.  Patient states the pain is improving.  Dressings intact  Objective: Vital signs in last 24 hours: Temp:  [97.2 F (36.2 C)-98.8 F (37.1 C)] 97.6 F (36.4 C) (08/26 1141) Pulse Rate:  [96-117] 97 (08/26 1141) Resp:  [8-22] 18 (08/26 1141) BP: (89-145)/(59-106) 98/66 (08/26 1229) SpO2:  [91 %-100 %] 98 % (08/26 1141)  CBC    Component Value Date/Time   WBC 8.0 03/04/2022 0224   RBC 2.70 (L) 03/04/2022 0224   HGB 7.6 (L) 03/04/2022 0224   HGB 8.7 (L) 10/10/2020 1031   HCT 23.9 (L) 03/04/2022 0224   PLT 313 03/04/2022 0224   PLT 236 10/10/2020 1031   MCV 88.5 03/04/2022 0224   MCH 28.1 03/04/2022 0224   MCHC 31.8 03/04/2022 0224   RDW 14.4 03/04/2022 0224   LYMPHSABS 0.8 02/25/2022 2040   MONOABS 0.8 02/25/2022 2040   EOSABS 0.0 02/25/2022 2040   BASOSABS 0.0 02/25/2022 2040      Latest Ref Rng & Units 03/04/2022    2:24 AM 03/03/2022    4:31 AM 03/02/2022    3:33 AM  BMP  Glucose 70 - 99 mg/dL 257  431  108   BUN 6 - 20 mg/dL 11  10  9   Creatinine 0.44 - 1.00 mg/dL 1.11  1.04  1.04   Sodium 135 - 145 mmol/L 133  130  136   Potassium 3.5 - 5.1 mmol/L 3.7  4.3  4.2   Chloride 98 - 111 mmol/L 102  100  109   CO2 22 - 32 mmol/L 20  23  21   Calcium 8.9 - 10.3 mg/dL 7.9  8.0  7.9      Foot appears stable with improvement.  Serosanguineous drainage noted.  Minimal purulence.  Retention sutures are intact.  Approved tenderness with palpation throughout the plantar arch of the foot.  No malodor.   Assessment/Plan: 1 Day Post-Op Procedure(s) (LRB): AMPUTATION OF FOURTH AND FIFTH TOES, Irrigation and debridment (Right) DOS: 03/02/2022 POV #1 -Dressings changed.   -Planning for return to the OR tomorrow, 03/05/2022, after 5 PM for repeat irrigation and debridement with delayed primary closure RT foot.  N.p.o. after full  breakfast.  Preoperative orders placed -Continue IV antibiotics Maxipime and Flagyl as per order -Cultures still pending.  RARE GRAM POSITIVE COCCI IN PAIRS. RARE GROUP B  STREP(S.AGALACTIAE)ISOLATED  TESTING AGAINST S. AGALACTIAE NOT ROUTINELY PERFORMED DUE TO PREDICTABILITY OF AMP/PEN/VAN SUSCEPTIBILITY.  WITHIN MIXED ORGANISMS  -Will continue to follow   Mikie Misner M. Jamariya Davidoff, DPM [facility]  Dr. Jessie Schrieber M. Dhruv Christina, DPM    [address]                                    [place], [place] [address]                Office [phone]  Fax [phone]       

## 2022-03-04 NOTE — Progress Notes (Signed)
PROGRESS NOTE    Abigail Moran  KVQ:[id number] DOB: 09-23-1975 DOA: 02/25/2022 PCP: Sandford Craze, NP   Brief Narrative:  46 y.o. female with medical history significant of uncontrolled type I diabetes, medical noncompliance, gastroparesis, marijuana abuse, erythropoietin deficiency anemia, right foot cellulitis and abscess in 05/2021, DVT, nephrolithiasis, menorrhagia, CKD stage II-IIIa, vitamin D deficiency, CAD, hypertension, hyperlipidemia. Recently seen in the ED on 8/7 for right foot infection.  X-ray was negative for osteomyelitis and she was discharged with prescription for doxycycline.  She did not take the antibiotic and also noncompliant with insulin.  Returned to the ED with toe pain/worsening infection of her right foot. She was found to be in DKA.  She was hospitalized for further management. Podiatry consulted. Recommend transferring to [facility] for vascular surgery consultation and intervention prior to right fourth and fifth digit amputations. Consulted vascular surgery for evaluation (no further work-up per imaging) prior to amputation per podiatry.    Assessment & Plan:   Principal Problem:   DKA, type 1 (HCC) Active Problems:   CAD (coronary artery disease)   Erythropoietin deficiency anemia   Hyperlipidemia   Hyponatremia   Sepsis (HCC)   Primary hypertension   Diabetic foot infection (HCC)   Acute-on-chronic kidney injury (HCC)   DKA (diabetic ketoacidosis) (HCC)   Sepsis secondary to diabetic infection right foot/osteomyelitis with abscess right  fourth and fifth digits, sepsis present on admission -Continue broad-spectrum antibiotics with cefepime, vancomycin -Podiatry following s/p 4/5th amputation R toes/I&D - tolerated well  -May require additional washout and delayed primary closure per podiatry (potentially Monday 03/05/22) -Three-vessel runoff reassuring for adequate blood flow to support amputation -Leukocytosis tachycardia with notable source meet  criteria for sepsis, POA(resolving) -Imaging consistent with osteomyelitis, MRI confirmed.   Diabetic ketoacidosis in the setting of poorly controlled diabetes mellitus type 1, resolved - Anion gap closed, transitioned to subcu insulin and p.o. intake  - Monitor closely while n.p.o. perioperatively  - HbA1c 13.1 implying long-term and profound noncompliance.   - Continue long-acting Semglee 20 units daily, sliding scale insulin and hypoglycemic protocol - Glucose continues to be quite labile upwards of 400 overnight  Acute kidney injury on chronic kidney disease stage IIIa, resolving - Baseline creatinine 1.0.  Presented with creatinine of 1.4.   - Resolved, at baseline   Hypokalemia, resolved - Continue to follow   Pseudohyponatremia - Resolved with correction of glucose   Acute on chronic - anemia of chronic kidney disease - Hemoglobin downtrending, likely partially dilutional vs post op at intake - Baseline anemia around 8-9  Essential hypertension - Hold home medications, currently well controlled   Coronary artery disease - Without current symptomatology, resume home medications at discharge   Hyperlipidemia - Resume statin.   Medical noncompliance - Plans was emphasized to the patient.  She however mentions that she does take her insulin regularly.  - This will need to be an ongoing conversation with her.      Morbid obesity - Body mass index is 33.27 kg/m.  DVT prophylaxis: Heparin Code Status: Full Family Communication: None present  Status is: Inpatient  Dispo: The patient is from: Home              Anticipated d/c is to: Home              Anticipated d/c date is: 48 to 72 hours pending surgical course              Patient currently not medically stable for discharge  Consultants:  Podiatry, vascular  Procedures:  Aortogram, right lower extremity Amputation of R 4/5th toes 8/25 Pending re-evaluation/closure 8/28  Antimicrobials:  Cefepime,  vancomycin, ongoing  Subjective: No acute issues or events overnight denies nausea vomiting diarrhea constipation any fevers chills or chest pain.  Right foot pain currently well controlled  Objective: Vitals:   03/03/22 1648 03/03/22 2049 03/03/22 2345 03/04/22 0619  BP: 111/78 129/85 123/75 123/87  Pulse:  99 99 86  Resp:  20 20 17   Temp:  98.2 F (36.8 C) 98.5 F (36.9 C) 98.1 F (36.7 C)  TempSrc:  Oral Oral Oral  SpO2:  100% 100% 98%  Weight:      Height:        Intake/Output Summary (Last 24 hours) at 03/04/2022 0738 Last data filed at 03/04/2022 [id number] Gross per 24 hour  Intake 546.19 ml  Output 2200 ml  Net -1653.81 ml    Filed Weights   02/25/22 1943 02/26/22 0120  Weight: 90.7 kg 92.1 kg    Examination:  General:  Pleasantly resting in bed, No acute distress. HEENT:  Normocephalic atraumatic.  Sclerae nonicteric, noninjected.  Extraocular movements intact bilaterally. Neck:  Without mass or deformity.  Trachea is midline. Lungs:  Clear to auscultate bilaterally without rhonchi, wheeze, or rales. Heart:  Regular rate and rhythm.  Without murmurs, rubs, or gallops. Abdomen:  Soft, nontender, nondistended.  Without guarding or rebound. Extremities: Right foot bandage clean dry intact  Data Reviewed: I have personally reviewed following labs and imaging studies  CBC: Recent Labs  Lab 02/25/22 2040 02/25/22 2215 02/28/22 0712 03/01/22 0943 03/02/22 0333 03/03/22 0431 03/04/22 0224  WBC 12.9*   < > 8.4 8.8 8.8 7.4 8.0  NEUTROABS 11.1*  --   --   --   --   --   --   HGB 10.2*   < > 8.1* 8.0* 8.0* 9.1* 7.6*  HCT 32.1*   < > 25.4* 24.7* 24.7* 28.5* 23.9*  MCV 88.2   < > 88.5 85.8 87.6 88.5 88.5  PLT 304   < > 280 291 318 306 313   < > = values in this interval not displayed.    Basic Metabolic Panel: Recent Labs  Lab 02/27/22 0247 02/28/22 0712 03/01/22 0943 03/02/22 0333 03/03/22 0431 03/04/22 0224  NA 129* 133* 137 136 130* 133*  K 4.0 3.4*  3.1* 4.2 4.3 3.7  CL 106 106 111 109 100 102  CO2 16* 20* 20* 21* 23 20*  GLUCOSE 311* 292* 103* 108* 431* 257*  BUN 16 14 9 9 10 11   CREATININE 1.24* 1.03* 1.02* 1.04* 1.04* 1.11*  CALCIUM 7.3* 7.5* 8.0* 7.9* 8.0* 7.9*  MG 2.2  --   --  2.1  --   --     GFR: Estimated Creatinine Clearance: 71 mL/min (A) (by C-G formula based on SCr of 1.11 mg/dL (H)). Liver Function Tests: Recent Labs  Lab 02/25/22 2230  AST 15  ALT 11  ALKPHOS 130*  BILITOT 1.2  PROT 8.3*  ALBUMIN 2.9*    No results for input(s): "LIPASE", "AMYLASE" in the last 168 hours. No results for input(s): "AMMONIA" in the last 168 hours. Coagulation Profile: No results for input(s): "INR", "PROTIME" in the last 168 hours. Cardiac Enzymes: No results for input(s): "CKTOTAL", "CKMB", "CKMBINDEX", "TROPONINI" in the last 168 hours. BNP (last 3 results) No results for input(s): "PROBNP" in the last 8760 hours. HbA1C: No results for input(s): "HGBA1C" in the last 72  hours. CBG: Recent Labs  Lab 03/03/22 0602 03/03/22 1138 03/03/22 1642 03/03/22 2048 03/04/22 0613  GLUCAP 390* 153* 104* 151* 179*    Lipid Profile: No results for input(s): "CHOL", "HDL", "LDLCALC", "TRIG", "CHOLHDL", "LDLDIRECT" in the last 72 hours. Thyroid Function Tests: No results for input(s): "TSH", "T4TOTAL", "FREET4", "T3FREE", "THYROIDAB" in the last 72 hours. Anemia Panel: No results for input(s): "VITAMINB12", "FOLATE", "FERRITIN", "TIBC", "IRON", "RETICCTPCT" in the last 72 hours. Sepsis Labs: Recent Labs  Lab 02/25/22 2157 02/25/22 2318  LATICACIDVEN 1.1 1.3     Recent Results (from the past 240 hour(s))  Culture, blood (routine x 2)     Status: None   Collection Time: 02/25/22  9:58 PM   Specimen: BLOOD LEFT ARM  Result Value Ref Range Status   Specimen Description   Final    BLOOD LEFT ARM Performed at [facility], [address] [place], [place] [address]    Special Requests   Final    BOTTLES DRAWN  AEROBIC AND ANAEROBIC Blood Culture adequate volume Performed at [facility], [address], [place], [place] [address]    Culture   Final    NO GROWTH 5 DAYS Performed at [facility], [address], [place], [place] [address]    Report Status 03/03/2022 FINAL  Final  Culture, blood (routine x 2)     Status: None   Collection Time: 02/25/22 10:29 PM   Specimen: BLOOD RIGHT ARM  Result Value Ref Range Status   Specimen Description   Final    BLOOD RIGHT ARM Performed at [facility], [address] [place], [place] [address]    Special Requests   Final    BOTTLES DRAWN AEROBIC AND ANAEROBIC Blood Culture adequate volume Performed at [facility], [address] [place], [place] [address]    Culture   Final    NO GROWTH 5 DAYS Performed at [facility], [address] [place], [place] [address]    Report Status 03/03/2022 FINAL  Final  MRSA Next Gen by PCR, Nasal     Status: None   Collection Time: 02/26/22  1:19 AM   Specimen: Nasal Mucosa; Nasal Swab  Result Value Ref Range Status   MRSA by PCR Next Gen NOT DETECTED NOT DETECTED Final    Comment: (NOTE) The GeneXpert MRSA Assay (FDA approved for NASAL specimens only), is one component of a comprehensive MRSA colonization surveillance program. It is not intended to diagnose MRSA infection nor to guide or monitor treatment for MRSA infections. Test performance is not FDA approved in patients less than 11 years old. Performed at [facility], [address], [place], [place] [address]   Aerobic/Anaerobic Culture w Gram Stain (surgical/deep wound)     Status: None (Preliminary result)   Collection Time: 03/02/22  7:16 PM   Specimen: Foot, Right; Abscess  Result Value Ref Range Status   Specimen Description ABSCESS RIGHT FOOT  Final   Special Requests NONE  Final   Gram Stain   Final    RARE WBC PRESENT,BOTH PMN AND MONONUCLEAR RARE GRAM POSITIVE COCCI IN  PAIRS    Culture   Final    TOO YOUNG TO READ Performed at [facility], [address], [place], [place] [address]    Report Status PENDING  Incomplete    Radiology Studies: DG Foot Complete Right  Result Date: 03/02/2022 CLINICAL DATA:  Postoperative. EXAM: RIGHT FOOT COMPLETE - 3+ VIEW COMPARISON:  Right foot x-ray 02/25/2022 FINDINGS: There has been interval amputation of the distal fourth and fifth metatarsals and phalanges. Surgical margin appears normal. No fracture or dislocation. There is mild hallux valgus. There is soft tissue swelling of the forefoot. IMPRESSION: 1. Status post amputation of the distal fourth and fifth metatarsals in phalanges. Electronically Signed   By: Ronney Asters M.D.   On: 03/02/2022 22:58     Scheduled Meds:  Chlorhexidine Gluconate Cloth  6 each Topical Daily   heparin injection (subcutaneous)  5,000 Units Subcutaneous Q8H   insulin aspart  0-15 Units Subcutaneous TID WC   insulin aspart  0-5 Units Subcutaneous QHS   insulin aspart  4 Units Subcutaneous TID WC   insulin glargine-yfgn  20 Units Subcutaneous Daily   sodium chloride flush  3 mL Intravenous Q12H   Continuous Infusions:  sodium chloride 10 mL/hr at 03/03/22 1058   ceFEPime (MAXIPIME) IV 2 g (03/04/22 0640)   metronidazole 500 mg (03/03/22 2245)   vancomycin 166.7 mL/hr at 03/03/22 1058     LOS: 6 days   Time spent: 63min  Kaleel Schmieder C Lashawna Poche, DO [facility]  If 7PM-7AM, please contact night-coverage [email or web address]  03/04/2022, 7:38 AM

## 2022-03-05 ENCOUNTER — Inpatient Hospital Stay (HOSPITAL_COMMUNITY): Payer: Self-pay | Admitting: Anesthesiology

## 2022-03-05 ENCOUNTER — Encounter (HOSPITAL_COMMUNITY)
Admission: EM | Disposition: A | Discharge status: Home / Self Care | Payer: Self-pay | Source: Home / Self Care | Attending: Internal Medicine

## 2022-03-05 ENCOUNTER — Encounter (HOSPITAL_COMMUNITY): Payer: Self-pay | Admitting: Internal Medicine

## 2022-03-05 DIAGNOSIS — L089 Local infection of the skin and subcutaneous tissue, unspecified: Secondary | ICD-10-CM

## 2022-03-05 DIAGNOSIS — T8789 Other complications of amputation stump: Secondary | ICD-10-CM

## 2022-03-05 DIAGNOSIS — E11628 Type 2 diabetes mellitus with other skin complications: Secondary | ICD-10-CM

## 2022-03-05 HISTORY — PX: I & D EXTREMITY: SHX5045

## 2022-03-05 LAB — BASIC METABOLIC PANEL
Anion gap: 9 (ref 5–15)
BUN: 12 mg/dL (ref 6–20)
CO2: 22 mmol/L (ref 22–32)
Calcium: 8.3 mg/dL — ABNORMAL LOW (ref 8.9–10.3)
Chloride: 104 mmol/L (ref 98–111)
Creatinine, Ser: 1.26 mg/dL — ABNORMAL HIGH (ref 0.44–1.00)
GFR, Estimated: 53 mL/min — ABNORMAL LOW (ref >60)
Glucose, Bld: 306 mg/dL — ABNORMAL HIGH (ref 70–99)
Potassium: 3.9 mmol/L (ref 3.5–5.1)
Sodium: 135 mmol/L (ref 135–145)

## 2022-03-05 LAB — GLUCOSE, CAPILLARY
Glucose-Capillary: 292 mg/dL — ABNORMAL HIGH (ref 70–99)
Glucose-Capillary: 210 mg/dL — ABNORMAL HIGH (ref 70–99)
Glucose-Capillary: 102 mg/dL — ABNORMAL HIGH (ref 70–99)
Glucose-Capillary: 98 mg/dL (ref 70–99)
Glucose-Capillary: 280 mg/dL — ABNORMAL HIGH (ref 70–99)

## 2022-03-05 LAB — CBC
HCT: 26.2 % — ABNORMAL LOW (ref 36.0–46.0)
Hemoglobin: 8.3 g/dL — ABNORMAL LOW (ref 12.0–15.0)
MCH: 28 pg (ref 26.0–34.0)
MCHC: 31.7 g/dL (ref 30.0–36.0)
MCV: 88.5 fL (ref 80.0–100.0)
Platelets: 383 10*3/uL (ref 150–400)
RBC: 2.96 MIL/uL — ABNORMAL LOW (ref 3.87–5.11)
RDW: 14.2 % (ref 11.5–15.5)
WBC: 6.7 10*3/uL (ref 4.0–10.5)
nRBC: 0.3 % — ABNORMAL HIGH (ref 0.0–0.2)

## 2022-03-05 SURGERY — IRRIGATION AND DEBRIDEMENT EXTREMITY
Anesthesia: General | Laterality: Right

## 2022-03-05 MED ORDER — DEXAMETHASONE SODIUM PHOSPHATE 10 MG/ML IJ SOLN
INTRAMUSCULAR | Status: AC
  Filled 2022-03-05: qty 1

## 2022-03-05 MED ORDER — ORAL CARE MOUTH RINSE: 15.0000 mL | Freq: Once | OROMUCOSAL | Status: DC

## 2022-03-05 MED ORDER — LIDOCAINE 2% (20 MG/ML) 5 ML SYRINGE
INTRAMUSCULAR | Status: AC
  Filled 2022-03-05: qty 5

## 2022-03-05 MED ORDER — ACETAMINOPHEN 325 MG PO TABS: 325.0000 mg | ORAL_TABLET | ORAL | Status: DC | PRN

## 2022-03-05 MED ORDER — FENTANYL CITRATE (PF) 100 MCG/2ML IJ SOLN: 25.0000 ug | INTRAMUSCULAR | Status: DC | PRN

## 2022-03-05 MED ORDER — ACETAMINOPHEN 10 MG/ML IV SOLN: 1000.0000 mg | Freq: Once | INTRAVENOUS | Status: DC | PRN

## 2022-03-05 MED ORDER — MIDAZOLAM HCL 2 MG/2ML IJ SOLN
INTRAMUSCULAR | Status: DC | PRN
  Administered 2022-03-05: 2 mg via INTRAVENOUS

## 2022-03-05 MED ORDER — ONDANSETRON HCL 4 MG/2ML IJ SOLN
INTRAMUSCULAR | Status: AC
Start: 2022-03-05 — End: ?
  Filled 2022-03-05: qty 2

## 2022-03-05 MED ORDER — ACETAMINOPHEN 160 MG/5ML PO SOLN: 325.0000 mg | ORAL | Status: DC | PRN

## 2022-03-05 MED ORDER — OXYCODONE HCL 5 MG/5ML PO SOLN: 5.0000 mg | Freq: Once | ORAL | Status: DC | PRN

## 2022-03-05 MED ORDER — LACTATED RINGERS IV SOLN: INTRAVENOUS | Status: DC

## 2022-03-05 MED ORDER — SODIUM CHLORIDE 0.9 % IR SOLN
Status: DC | PRN
  Administered 2022-03-05: 1000 mL

## 2022-03-05 MED ORDER — AMISULPRIDE (ANTIEMETIC) 5 MG/2ML IV SOLN: 10.0000 mg | Freq: Once | INTRAVENOUS | Status: DC | PRN

## 2022-03-05 MED ORDER — ONDANSETRON HCL 4 MG/2ML IJ SOLN
INTRAMUSCULAR | Status: DC | PRN
  Administered 2022-03-05: 4 mg via INTRAVENOUS

## 2022-03-05 MED ORDER — LIDOCAINE HCL 2 % IJ SOLN
INTRAMUSCULAR | Status: DC | PRN
  Administered 2022-03-05: 5 mL

## 2022-03-05 MED ORDER — LIDOCAINE HCL (PF) 2 % IJ SOLN
INTRAMUSCULAR | Status: AC
  Filled 2022-03-05: qty 10

## 2022-03-05 MED ORDER — OXYCODONE HCL 5 MG PO TABS: 5.0000 mg | ORAL_TABLET | Freq: Once | ORAL | Status: DC | PRN

## 2022-03-05 MED ORDER — LIDOCAINE 2% (20 MG/ML) 5 ML SYRINGE
INTRAMUSCULAR | Status: DC | PRN
  Administered 2022-03-05: 40 mg via INTRAVENOUS

## 2022-03-05 MED ORDER — SCOPOLAMINE 1 MG/3DAYS TD PT72: 1.0000 | MEDICATED_PATCH | TRANSDERMAL | Status: DC

## 2022-03-05 MED ORDER — PHENYLEPHRINE 80 MCG/ML (10ML) SYRINGE FOR IV PUSH (FOR BLOOD PRESSURE SUPPORT)
PREFILLED_SYRINGE | INTRAVENOUS | Status: DC | PRN
  Administered 2022-03-05 (×2): 80 ug via INTRAVENOUS

## 2022-03-05 MED ORDER — BUPIVACAINE HCL 0.5 % IJ SOLN
INTRAMUSCULAR | Status: AC
  Filled 2022-03-05: qty 1

## 2022-03-05 MED ORDER — 0.9 % SODIUM CHLORIDE (POUR BTL) OPTIME
TOPICAL | Status: DC | PRN
  Administered 2022-03-05: 1000 mL

## 2022-03-05 MED ORDER — TOBRAMYCIN SULFATE 1.2 G IJ SOLR
INTRAMUSCULAR | Status: AC
  Filled 2022-03-05: qty 1.2

## 2022-03-05 MED ORDER — BUPIVACAINE HCL (PF) 0.5 % IJ SOLN
INTRAMUSCULAR | Status: DC | PRN
  Administered 2022-03-05: 5 mL

## 2022-03-05 MED ORDER — DEXAMETHASONE SODIUM PHOSPHATE 10 MG/ML IJ SOLN
INTRAMUSCULAR | Status: DC | PRN
  Administered 2022-03-05: 4 mg via INTRAVENOUS

## 2022-03-05 MED ORDER — PROMETHAZINE HCL 25 MG/ML IJ SOLN: 6.2500 mg | INTRAMUSCULAR | Status: DC | PRN

## 2022-03-05 MED ORDER — SODIUM CHLORIDE 0.9 % IV SOLN
2.0000 g | INTRAVENOUS | Status: DC
  Administered 2022-03-05: 2 g via INTRAVENOUS
  Filled 2022-03-05 (×2): qty 20

## 2022-03-05 MED ORDER — PHENYLEPHRINE 80 MCG/ML (10ML) SYRINGE FOR IV PUSH (FOR BLOOD PRESSURE SUPPORT)
PREFILLED_SYRINGE | INTRAVENOUS | Status: AC
Start: 2022-03-05 — End: ?
  Filled 2022-03-05: qty 10

## 2022-03-05 MED ORDER — MIDAZOLAM HCL 2 MG/2ML IJ SOLN
INTRAMUSCULAR | Status: AC
  Filled 2022-03-05: qty 2

## 2022-03-05 MED ORDER — FENTANYL CITRATE (PF) 250 MCG/5ML IJ SOLN
INTRAMUSCULAR | Status: DC | PRN
  Administered 2022-03-05: 75 ug via INTRAVENOUS
  Administered 2022-03-05: 25 ug via INTRAVENOUS

## 2022-03-05 MED ORDER — SUCCINYLCHOLINE CHLORIDE 200 MG/10ML IV SOSY
PREFILLED_SYRINGE | INTRAVENOUS | Status: DC | PRN
  Administered 2022-03-05: 120 mg via INTRAVENOUS

## 2022-03-05 MED ORDER — SUCCINYLCHOLINE CHLORIDE 200 MG/10ML IV SOSY
PREFILLED_SYRINGE | INTRAVENOUS | Status: AC
  Filled 2022-03-05: qty 10

## 2022-03-05 MED ORDER — BUPIVACAINE HCL (PF) 0.5 % IJ SOLN
INTRAMUSCULAR | Status: AC
  Filled 2022-03-05: qty 30

## 2022-03-05 MED ORDER — VANCOMYCIN HCL 1000 MG IV SOLR
INTRAVENOUS | Status: AC
Start: 2022-03-05 — End: ?
  Filled 2022-03-05: qty 20

## 2022-03-05 MED ORDER — FENTANYL CITRATE (PF) 250 MCG/5ML IJ SOLN
INTRAMUSCULAR | Status: AC
  Filled 2022-03-05: qty 5

## 2022-03-05 MED ORDER — CHLORHEXIDINE GLUCONATE 0.12 % MT SOLN: 15.0000 mL | Freq: Once | OROMUCOSAL | Status: DC

## 2022-03-05 MED ORDER — PROPOFOL 10 MG/ML IV BOLUS
INTRAVENOUS | Status: DC | PRN
  Administered 2022-03-05: 130 mg via INTRAVENOUS

## 2022-03-05 MED ORDER — LIDOCAINE HCL 2 % IJ SOLN
INTRAMUSCULAR | Status: AC
  Filled 2022-03-05: qty 20

## 2022-03-05 SURGICAL SUPPLY — 56 items
BAG COUNTER SPONGE SURGICOUNT (BAG) ×1 IMPLANT
BLADE SAW SGTL 83.5X18.5 (BLADE) ×1 IMPLANT
BLADE SURG 15 STRL LF DISP TIS (BLADE) ×3 IMPLANT
BLADE SURG 15 STRL SS (BLADE) ×3
BNDG COHESIVE 6X5 TAN STRL LF (GAUZE/BANDAGES/DRESSINGS) IMPLANT
BNDG ELASTIC 4X5.8 VLCR STR LF (GAUZE/BANDAGES/DRESSINGS) IMPLANT
BNDG ESMARK 4X9 LF (GAUZE/BANDAGES/DRESSINGS) ×1 IMPLANT
BNDG GAUZE DERMACEA FLUFF 4 (GAUZE/BANDAGES/DRESSINGS) IMPLANT
BNDG GAUZE ELAST 4 BULKY (GAUZE/BANDAGES/DRESSINGS) ×2 IMPLANT
BOWL SMART MIX CTS (DISPOSABLE) IMPLANT
CHLORAPREP W/TINT 26 (MISCELLANEOUS) ×1 IMPLANT
COVER SURGICAL LIGHT HANDLE (MISCELLANEOUS) ×1 IMPLANT
CUFF TOURN SGL QUICK 18X4 (TOURNIQUET CUFF) ×1 IMPLANT
CUFF TOURN SGL QUICK 34 (TOURNIQUET CUFF) ×1
CUFF TRNQT CYL 34X4.125X (TOURNIQUET CUFF) ×1 IMPLANT
DRAPE OEC MINIVIEW 54X84 (DRAPES) ×1 IMPLANT
DRAPE U-SHAPE 47X51 STRL (DRAPES) ×1 IMPLANT
DRSG PAD ABDOMINAL 8X10 ST (GAUZE/BANDAGES/DRESSINGS) ×1 IMPLANT
DRSG XEROFORM 1X8 (GAUZE/BANDAGES/DRESSINGS) IMPLANT
ELECT CAUTERY BLADE 6.4 (BLADE) ×1 IMPLANT
ELECT REM PT RETURN 9FT ADLT (ELECTROSURGICAL)
ELECTRODE REM PT RTRN 9FT ADLT (ELECTROSURGICAL) IMPLANT
GAUZE PAD ABD 8X10 STRL (GAUZE/BANDAGES/DRESSINGS) IMPLANT
GAUZE SPONGE 4X4 12PLY STRL (GAUZE/BANDAGES/DRESSINGS) ×1 IMPLANT
GAUZE XEROFORM 1X8 LF (GAUZE/BANDAGES/DRESSINGS) ×1 IMPLANT
GLOVE BIO SURGEON STRL SZ8 (GLOVE) ×1 IMPLANT
GLOVE BIOGEL PI IND STRL 8 (GLOVE) ×1 IMPLANT
GLOVE BIOGEL PI INDICATOR 8 (GLOVE) ×1
GOWN STRL REUS W/ TWL LRG LVL3 (GOWN DISPOSABLE) ×2 IMPLANT
GOWN STRL REUS W/TWL LRG LVL3 (GOWN DISPOSABLE) ×2
HANDPIECE INTERPULSE COAX TIP (DISPOSABLE) ×1
KIT BASIN OR (CUSTOM PROCEDURE TRAY) ×1 IMPLANT
KIT TURNOVER KIT B (KITS) ×1 IMPLANT
MANIFOLD NEPTUNE II (INSTRUMENTS) ×1 IMPLANT
NDL HYPO 25GX1X1/2 BEV (NEEDLE) ×1 IMPLANT
NEEDLE HYPO 25GX1X1/2 BEV (NEEDLE) ×1 IMPLANT
NS IRRIG 1000ML POUR BTL (IV SOLUTION) ×1 IMPLANT
PACK ORTHO EXTREMITY (CUSTOM PROCEDURE TRAY) ×1 IMPLANT
PAD ARMBOARD 7.5X6 YLW CONV (MISCELLANEOUS) ×2 IMPLANT
PAD CAST 4YDX4 CTTN HI CHSV (CAST SUPPLIES) ×1 IMPLANT
PADDING CAST COTTON 4X4 STRL (CAST SUPPLIES) ×1
PADDING CAST COTTON 6X4 STRL (CAST SUPPLIES) ×1 IMPLANT
SET HNDPC FAN SPRY TIP SCT (DISPOSABLE) IMPLANT
SOL PREP POV-IOD 4OZ 10% (MISCELLANEOUS) ×2 IMPLANT
STAPLER VISISTAT (STAPLE) ×1 IMPLANT
STAPLER VISISTAT 35W (STAPLE) ×1 IMPLANT
STOCKINETTE IMPERVIOUS 9X36 MD (GAUZE/BANDAGES/DRESSINGS) ×1 IMPLANT
SUT ETHILON 3 0 PS 1 (SUTURE) IMPLANT
SUT PROLENE 3 0 PS 2 (SUTURE) ×1 IMPLANT
SWAB CULTURE LIQ STUART DBL (MISCELLANEOUS) ×1 IMPLANT
SWAB CULTURE LIQUID MINI MALE (MISCELLANEOUS) ×1 IMPLANT
SYR CONTROL 10ML LL (SYRINGE) ×1 IMPLANT
TOWEL GREEN STERILE (TOWEL DISPOSABLE) ×1 IMPLANT
TOWEL GREEN STERILE FF (TOWEL DISPOSABLE) ×1 IMPLANT
TUBE CONNECTING 12X1/4 (SUCTIONS) ×1 IMPLANT
YANKAUER SUCT BULB TIP NO VENT (SUCTIONS) ×1 IMPLANT

## 2022-03-05 NOTE — TOC Initial Note (Signed)
Transition of Care (TOC) - Initial/Assessment Note  Donn Pierini RN, BSN Transitions of Care Unit 4E- RN Case Manager See Treatment Team for direct phone #    Patient Details  Name: Abigail Moran MRN: [id number] Date of Birth: May 08, 1976  Transition of Care [facility]) CM/SW Contact:    Darrold Span, RN Phone Number: 03/05/2022, 2:52 PM  Clinical Narrative:                 Pt admitted with DKA and Sepsis secondary to diabetic infection right foot/osteomyelitis with abscess right, fourth and fifth digits.  CM spoke with pt at bedside to discuss transition needs. Per conversation pt reports she has primary care provider, however does not currently have insurance. Pt reports she had signed up for insurance through the Marketplace- however was unable to pay the premiums so the coverage was cancelled. She is self-employed and hasn't been working much so has been unable to afford getting coverage back, she states she is working on trying to get coverage back but finances are a barrier.  Pt declined needing any info on how to sign up for insurance through the MarketPlace, just needs the finances to do so.   Also discussed potential DME need for RW- she reports her grandmother may have had one that she can use- will ask her mom to check on that.  Medications were also discussed- pt may be eligible for MATCH at discharge if she has not used program within the last 12 mo.- will assess medications for discharge and assist if needed. TOC to f/u on medication assistance.   Pt reports her family will transport home.      Expected Discharge Plan: Home/Self Care Barriers to Discharge: Inadequate or no insurance, Continued Medical Work up   Patient Goals and CMS Choice        Expected Discharge Plan and Services Expected Discharge Plan: Home/Self Care   Discharge Planning Services: CM Consult, MATCH Program, Other - See comment (insurance)   Living arrangements for the past 2 months:  Single Family Home                           HH Arranged: NA HH Agency: NA        Prior Living Arrangements/Services Living arrangements for the past 2 months: Single Family Home Lives with:: Parents Patient language and need for interpreter reviewed:: Yes Do you feel safe going back to the place where you live?: Yes      Need for Family Participation in Patient Care: No (Comment) Care giver support system in place?: Yes (comment) Current home services: DME Criminal Activity/Legal Involvement Pertinent to Current Situation/Hospitalization: No - Comment as needed  Activities of Daily Living Home Assistive Devices/Equipment: None ADL Screening (condition at time of admission) Patient's cognitive ability adequate to safely complete daily activities?: Yes Is the patient deaf or have difficulty hearing?: No Does the patient have difficulty seeing, even when wearing glasses/contacts?: No Does the patient have difficulty concentrating, remembering, or making decisions?: No Patient able to express need for assistance with ADLs?: Yes Does the patient have difficulty dressing or bathing?: No Independently performs ADLs?: Yes (appropriate for developmental age) Does the patient have difficulty walking or climbing stairs?: Yes Weakness of Legs: None Weakness of Arms/Hands: None  Permission Sought/Granted                  Emotional Assessment Appearance:: Appears stated age Attitude/Demeanor/Rapport: Engaged Affect (typically observed):  Accepting, Appropriate, Pleasant Orientation: : Oriented to Self, Oriented to Place, Oriented to  Time, Oriented to Situation Alcohol / Substance Use: Not Applicable Psych Involvement: No (comment)  Admission diagnosis:  DKA, type 1 (HCC) [E10.10] Diabetic foot infection (HCC) [B28.413, L08.9] Diabetic ketoacidosis without coma associated with type 1 diabetes mellitus (HCC) [E10.10] Sepsis, due to unspecified organism, unspecified whether  acute organ dysfunction present (HCC) [A41.9] DKA (diabetic ketoacidosis) (HCC) [E11.10] Patient Active Problem List   Diagnosis Date Noted   Diabetic foot infection (HCC) 02/26/2022   Acute-on-chronic kidney injury (HCC) 02/26/2022   DKA (diabetic ketoacidosis) (HCC) 02/26/2022   DKA, type 1 (HCC) 02/25/2022   Primary hypertension 05/26/2021   Skin ulcer of right foot (HCC) 05/26/2021   Cutaneous abscess of right foot    Hyponatremia 05/15/2021   Hypocalcemia 05/15/2021   Sepsis (HCC) 05/15/2021   Right shoulder pain 05/09/2021   Onychomycosis 03/31/2021   Menstrual bleeding problem 03/31/2021   Hyperlipidemia 03/31/2021   Menorrhagia with irregular cycle 10/11/2020   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   Iron deficiency anemia due to chronic blood loss 09/28/2020   Renal insufficiency    Gastroparesis 08/08/2016   Carpal tunnel syndrome 08/08/2016   CAD (coronary artery disease) 06/07/2015   CHF (congestive heart failure) (HCC) 06/07/2015   Preventative health care 09/27/2014   Vitamin D deficiency 09/14/2014   Uncontrolled type 1 diabetes mellitus with hyperglycemia (HCC) 10/06/2013   PCP:  Sandford Craze, NP Pharmacy:   [facility] [phone] - [place], [place] - [address] AT [facility] [address] [facility] [place] [address] Phone: [phone] Fax: [phone]  [facility] [address], [place] - [address] [place] [address] Phone: [phone] Fax: [phone]     Social Determinants of Health (SDOH) Interventions    Readmission Risk Interventions     No data to display

## 2022-03-05 NOTE — Anesthesia Preprocedure Evaluation (Signed)
Anesthesia Evaluation  Patient identified by MRN, date of birth, ID band Patient awake    Reviewed: Allergy & Precautions, NPO status , Patient's Chart, lab work & pertinent test results  Airway Mallampati: III  TM Distance: >3 FB Neck ROM: Full    Dental  (+) Teeth Intact, Dental Advisory Given   Pulmonary neg pulmonary ROS,    breath sounds clear to auscultation       Cardiovascular hypertension, Pt. on medications + Past MI and +CHF   Rhythm:Regular Rate:Normal     Neuro/Psych negative neurological ROS  negative psych ROS   GI/Hepatic negative GI ROS, Neg liver ROS,   Endo/Other  diabetes, Type 2, Insulin Dependent  Renal/GU      Musculoskeletal   Abdominal Normal abdominal exam  (+)   Peds  Hematology   Anesthesia Other Findings   Reproductive/Obstetrics                             Anesthesia Physical Anesthesia Plan  ASA: 2  Anesthesia Plan: General   Post-op Pain Management:    Induction: Rapid sequence and Cricoid pressure planned  PONV Risk Score and Plan: 4 or greater and Ondansetron, Dexamethasone, Midazolam and Scopolamine patch - Pre-op  Airway Management Planned: Oral ETT  Additional Equipment: None  Intra-op Plan:   Post-operative Plan: Extubation in OR  Informed Consent:   Plan Discussed with: CRNA  Anesthesia Plan Comments:         Anesthesia Quick Evaluation

## 2022-03-05 NOTE — Progress Notes (Signed)
Orthopedic Tech Progress Note Patient Details:  Abigail Moran 14-Jan-1976 [id number]  Ortho Devices Type of Ortho Device: Postop shoe/boot Ortho Device/Splint Interventions: Ordered      Bella Kennedy A Topacio Cella 03/05/2022, 7:08 PM

## 2022-03-05 NOTE — Progress Notes (Signed)
PROGRESS NOTE    Abigail Moran  [id number] DOB: December 13, 1975 DOA: 02/25/2022 PCP: [name], NP   Brief Narrative:  46 y.o. female with medical history significant of uncontrolled type I diabetes, medical noncompliance, gastroparesis, marijuana abuse, erythropoietin deficiency anemia, right foot cellulitis and abscess in 05/2021, DVT, nephrolithiasis, menorrhagia, CKD stage II-IIIa, vitamin D deficiency, CAD, hypertension, hyperlipidemia. Recently seen in the ED on 8/7 for right foot infection.  X-ray was negative for osteomyelitis and she was discharged with prescription for doxycycline.  She did not take the antibiotic and also noncompliant with insulin.  Returned to the ED with toe pain/worsening infection of her right foot. She was found to be in DKA.  She was hospitalized for further management. Podiatry consulted. Recommend transferring to [facility] for vascular surgery consultation and intervention prior to right fourth and fifth digit amputations. Consulted vascular surgery for evaluation (no further work-up per imaging) prior to amputation per podiatry.    Assessment & Plan:   Principal Problem:   DKA, type 1 ([place]) Active Problems:   CAD (coronary artery disease)   Erythropoietin deficiency anemia   Hyperlipidemia   Hyponatremia   Sepsis ([place])   Primary hypertension   Diabetic foot infection ([place])   Acute-on-chronic kidney injury ([place])   DKA (diabetic ketoacidosis) ([place])   Sepsis secondary to diabetic infection right foot/osteomyelitis with abscess right  fourth and fifth digits, sepsis present on admission -Continue broad-spectrum antibiotics with cefepime, vancomycin -Podiatry following s/p 4/5th amputation R toes/I&D - tolerated well  -May require additional washout and delayed primary closure per podiatry (tentatively scheduled 5 PM Monday 03/05/22) -Three-vessel runoff reassuring for adequate blood flow to support amputation -Leukocytosis tachycardia with notable  source meet criteria for sepsis, POA(resolving) -Imaging consistent with osteomyelitis, MRI confirmed.   Diabetic ketoacidosis in the setting of poorly controlled diabetes mellitus type 1, resolved - Anion gap closed, transitioned to subcu insulin and p.o. intake  - Monitor closely while n.p.o. perioperatively -half dose long-acting insulin on surgical days - HbA1c 13.1 implying long-term and profound noncompliance.   - Continue long-acting Semglee 20 units daily, sliding scale insulin and hypoglycemic protocol -Discussed transition to insulin pump at discharge with endocrinology, patient is interested in transitioning, I think this will markedly improve her outcomes and hypoglycemic events.  Acute kidney injury on chronic kidney disease stage IIIa, resolving - Baseline creatinine 1.0.  Presented with creatinine of 1.4.     Hypokalemia, resolved - Continue to follow   Pseudohyponatremia - Resolved with correction of glucose   Acute on chronic anemia of chronic kidney disease - Hemoglobin downtrending, likely partially dilutional vs post op at intake - Baseline anemia around 8-9  Essential hypertension - Currently well controlled off all medication   Coronary artery disease - Without current symptomatology, resume home medications at discharge   Hyperlipidemia - Resume statin.   Medical noncompliance -Admits to taking insulin irregularly and not following strictly with her diabetic diet.  - This will need to be an ongoing conversation with her.  -Recommending insulin pump as above.  Morbid obesity - Body mass index is 33.27 kg/m.  DVT prophylaxis: Heparin Code Status: Full Family Communication: None present  Status is: Inpatient  Dispo: The patient is from: Home              Anticipated d/c is to: Home              Anticipated d/c date is: 48 hours pending surgical course  Patient currently not medically stable for discharge  Consultants:  Podiatry,  vascular  Procedures:  Aortogram, right lower extremity Amputation of R 4/5th toes 8/25 Pending re-evaluation/closure 8/28  Antimicrobials:  Cefepime, vancomycin, ongoing  Subjective: No acute issues or events overnight denies nausea vomiting diarrhea constipation headache fevers chills chest pain or shortness of breath.  Right foot pain is currently well controlled, minimal pain with ambulation but markedly improving since intake.  Objective: Vitals:   03/04/22 1538 03/04/22 1944 03/05/22 0000 03/05/22 0400  BP: (!) 133/98 (!) 131/91 115/74 130/80  Pulse: 97  (!) 102 99  Resp: 16 18 16 16   Temp: 98.1 F (36.7 C) 98.5 F (36.9 C) 98.5 F (36.9 C) 98.1 F (36.7 C)  TempSrc: Oral Oral Oral Oral  SpO2: 99% 100% 98% 98%  Weight:      Height:        Intake/Output Summary (Last 24 hours) at 03/05/2022 0724 Last data filed at 03/04/2022 1100 Gross per 24 hour  Intake 764.39 ml  Output 1400 ml  Net -635.61 ml    Filed Weights   02/25/22 1943 02/26/22 0120  Weight: 90.7 kg 92.1 kg    Examination:  General:  Pleasantly resting in bed, No acute distress. HEENT:  Normocephalic atraumatic.  Sclerae nonicteric, noninjected.  Extraocular movements intact bilaterally. Neck:  Without mass or deformity.  Trachea is midline. Lungs:  Clear to auscultate bilaterally without rhonchi, wheeze, or rales. Heart:  Regular rate and rhythm.  Without murmurs, rubs, or gallops. Abdomen:  Soft, nontender, nondistended.  Without guarding or rebound. Extremities: Right foot bandage clean dry intact  Data Reviewed: I have personally reviewed following labs and imaging studies  CBC: Recent Labs  Lab 03/01/22 0943 03/02/22 0333 03/03/22 0431 03/04/22 0224 03/04/22 1446 03/05/22 0530  WBC 8.8 8.8 7.4 8.0  --  6.7  HGB 8.0* 8.0* 9.1* 7.6* 8.7* 8.3*  HCT 24.7* 24.7* 28.5* 23.9* 27.4* 26.2*  MCV 85.8 87.6 88.5 88.5  --  88.5  PLT 291 318 306 313  --  383    Basic Metabolic Panel: Recent  Labs  Lab 02/27/22 0247 02/28/22 0712 03/01/22 0943 03/02/22 0333 03/03/22 0431 03/04/22 0224 03/05/22 0530  NA 129*   < > 137 136 130* 133* 135  K 4.0   < > 3.1* 4.2 4.3 3.7 3.9  CL 106   < > 111 109 100 102 104  CO2 16*   < > 20* 21* 23 20* 22  GLUCOSE 311*   < > 103* 108* 431* 257* 306*  BUN 16   < > 9 9 10 11 12   CREATININE 1.24*   < > 1.02* 1.04* 1.04* 1.11* 1.26*  CALCIUM 7.3*   < > 8.0* 7.9* 8.0* 7.9* 8.3*  MG 2.2  --   --  2.1  --   --   --    < > = values in this interval not displayed.    GFR: Estimated Creatinine Clearance: 62.6 mL/min (A) (by C-G formula based on SCr of 1.26 mg/dL (H)). Liver Function Tests: No results for input(s): "AST", "ALT", "ALKPHOS", "BILITOT", "PROT", "ALBUMIN" in the last 168 hours.  No results for input(s): "LIPASE", "AMYLASE" in the last 168 hours. No results for input(s): "AMMONIA" in the last 168 hours. Coagulation Profile: No results for input(s): "INR", "PROTIME" in the last 168 hours. Cardiac Enzymes: No results for input(s): "CKTOTAL", "CKMB", "CKMBINDEX", "TROPONINI" in the last 168 hours. BNP (last 3 results) No results for input(s): "  PROBNP" in the last 8760 hours. HbA1C: No results for input(s): "HGBA1C" in the last 72 hours. CBG: Recent Labs  Lab 03/04/22 0838 03/04/22 1104 03/04/22 1541 03/04/22 2058 03/05/22 0552  GLUCAP 195* 236* 246* 245* 292*    Lipid Profile: No results for input(s): "CHOL", "HDL", "LDLCALC", "TRIG", "CHOLHDL", "LDLDIRECT" in the last 72 hours. Thyroid Function Tests: No results for input(s): "TSH", "T4TOTAL", "FREET4", "T3FREE", "THYROIDAB" in the last 72 hours. Anemia Panel: No results for input(s): "VITAMINB12", "FOLATE", "FERRITIN", "TIBC", "IRON", "RETICCTPCT" in the last 72 hours. Sepsis Labs: No results for input(s): "PROCALCITON", "LATICACIDVEN" in the last 168 hours.   Recent Results (from the past 240 hour(s))  Culture, blood (routine x 2)     Status: None   Collection Time:  02/25/22  9:58 PM   Specimen: BLOOD LEFT ARM  Result Value Ref Range Status   Specimen Description   Final    BLOOD LEFT ARM Performed at [facility], [address] [place], [place] [address]    Special Requests   Final    BOTTLES DRAWN AEROBIC AND ANAEROBIC Blood Culture adequate volume Performed at [facility], [address] [place], [place] [address]    Culture   Final    NO GROWTH 5 DAYS Performed at [facility], [address] [place], [place] [address]    Report Status 03/03/2022 FINAL  Final  Culture, blood (routine x 2)     Status: None   Collection Time: 02/25/22 10:29 PM   Specimen: BLOOD RIGHT ARM  Result Value Ref Range Status   Specimen Description   Final    BLOOD RIGHT ARM Performed at [facility], [address] [place], [place] [address]    Special Requests   Final    BOTTLES DRAWN AEROBIC AND ANAEROBIC Blood Culture adequate volume Performed at [facility], [address] [place], [place] [address]    Culture   Final    NO GROWTH 5 DAYS Performed at [facility], [address] [place], [place] [address]    Report Status 03/03/2022 FINAL  Final  MRSA Next Gen by PCR, Nasal     Status: None   Collection Time: 02/26/22  1:19 AM   Specimen: Nasal Mucosa; Nasal Swab  Result Value Ref Range Status   MRSA by PCR Next Gen NOT DETECTED NOT DETECTED Final    Comment: (NOTE) The GeneXpert MRSA Assay (FDA approved for NASAL specimens only), is one component of a comprehensive MRSA colonization surveillance program. It is not intended to diagnose MRSA infection nor to guide or monitor treatment for MRSA infections. Test performance is not FDA approved in patients less than 50 years old. Performed at [facility], [address], [place], [place] [address]   Aerobic/Anaerobic Culture w Gram Stain (surgical/deep wound)     Status: None (Preliminary result)   Collection Time:  03/02/22  7:16 PM   Specimen: Foot, Right; Abscess  Result Value Ref Range Status   Specimen Description ABSCESS RIGHT FOOT  Final   Special Requests NONE  Final   Gram Stain   Final    RARE WBC PRESENT,BOTH PMN AND MONONUCLEAR RARE GRAM POSITIVE COCCI IN PAIRS Performed at [facility], [address] [place], [place] [address]    Culture   Final    RARE GROUP B STREP(S.AGALACTIAE)ISOLATED TESTING AGAINST S. AGALACTIAE NOT ROUTINELY PERFORMED DUE TO PREDICTABILITY  OF AMP/PEN/VAN SUSCEPTIBILITY. WITHIN MIXED ORGANISMS NO ANAEROBES ISOLATED; CULTURE IN PROGRESS FOR 5 DAYS    Report Status PENDING  Incomplete    Radiology Studies: No results found.   Scheduled Meds:  heparin injection (subcutaneous)  5,000 Units Subcutaneous Q8H   insulin aspart  0-15 Units Subcutaneous TID WC   insulin aspart  0-5 Units Subcutaneous QHS   insulin aspart  4 Units Subcutaneous TID WC   insulin glargine-yfgn  20 Units Subcutaneous Daily   sodium chloride flush  3 mL Intravenous Q12H   Continuous Infusions:  sodium chloride 10 mL/hr at 03/03/22 1058   ceFEPime (MAXIPIME) IV 2 g (03/05/22 0520)   metronidazole 500 mg (03/04/22 2239)   vancomycin 1,250 mg (03/04/22 1048)     LOS: 7 days   Time spent: 47min  Janielle Mittelstadt C Donicia Druck, DO [facility]  If 7PM-7AM, please contact night-coverage [email or web address]  03/05/2022, 7:24 AM

## 2022-03-05 NOTE — Transfer of Care (Signed)
Immediate Anesthesia Transfer of Care Note  Patient: Mudlogger  Procedure(s) Performed: IRRIGATION AND DEBRIDEMENT AND CLOSURE RIGHT FOOT (Right)  Patient Location: PACU  Anesthesia Type:General  Level of Consciousness: drowsy  Airway & Oxygen Therapy: Patient Spontanous Breathing  Post-op Assessment: Report given to RN and Post -op Vital signs reviewed and stable  Post vital signs: Reviewed and stable  Last Vitals:  Vitals Value Taken Time  BP 127/87 03/05/22 1753  Temp 36.9 C 03/05/22 1753  Pulse 90 03/05/22 1755  Resp 21 03/05/22 1755  SpO2 93 % 03/05/22 1755  Vitals shown include unvalidated device data.  Last Pain:  Vitals:   03/05/22 1753  TempSrc:   PainSc: Asleep      Patients Stated Pain Goal: 3 (03/02/22 2025)  Complications: No notable events documented.

## 2022-03-05 NOTE — Anesthesia Procedure Notes (Signed)
Procedure Name: Intubation Date/Time: 03/05/2022 5:24 PM  Performed by: Lorie Phenix, CRNAPre-anesthesia Checklist: Patient identified, Emergency Drugs available, Suction available and Patient being monitored Patient Re-evaluated:Patient Re-evaluated prior to induction Oxygen Delivery Method: Circle system utilized Preoxygenation: Pre-oxygenation with 100% oxygen Induction Type: IV induction and Rapid sequence Laryngoscope Size: Mac and 3 Grade View: Grade I Tube type: Oral Tube size: 7.0 mm Number of attempts: 1 Airway Equipment and Method: Stylet Placement Confirmation: ETT inserted through vocal cords under direct vision, positive ETCO2 and breath sounds checked- equal and bilateral Secured at: 22 cm Tube secured with: Tape Dental Injury: Teeth and Oropharynx as per pre-operative assessment

## 2022-03-05 NOTE — Brief Op Note (Signed)
02/25/2022 - 03/05/2022  5:14 PM  PATIENT:  Abigail Moran  46 y.o. female  PRE-OPERATIVE DIAGNOSIS:  Right foot infection   POST-OPERATIVE DIAGNOSIS:  Same as pre-op   PROCEDURE:  Procedure(s): IRRIGATION AND DEBRIDEMENT AND CLOSURE RIGHT FOOT (Right)  SURGEON:  Surgeon(s) and Role:    * Lorenda Peck, DPM - Primary  PHYSICIAN ASSISTANT:   ASSISTANTS: none   ANESTHESIA:   local and MAC  EBL:  <10 ml   BLOOD ADMINISTERED:none  DRAINS: none   LOCAL MEDICATIONS USED:  MARCAINE   , LIDOCAINE , and Amount: 10 ml  SPECIMEN:  Source of Specimen:  right foot wound for culture   DISPOSITION OF SPECIMEN:   Right foot wound swab for culture   COUNTS:  YES  TOURNIQUET:  * No tourniquets in log *  DICTATION: .Note written in EPIC  PLAN OF CARE: Admit to inpatient   PATIENT DISPOSITION:  PACU - hemodynamically stable.   Delay start of Pharmacological VTE agent (>24hrs) due to surgical blood loss or risk of bleeding: no

## 2022-03-05 NOTE — Interval H&P Note (Signed)
History and Physical Interval Note:  03/05/2022 4:58 PM  Abigail Moran  has presented today for surgery, with the diagnosis of Amputation of Right Toes.  The various methods of treatment have been discussed with the patient and family. After consideration of risks, benefits and other options for treatment, the patient has consented to  Procedure(s): IRRIGATION AND DEBRIDEMENT AND CLOSURE RIGHT FOOT (Right) as a surgical intervention.  The patient's history has been reviewed, patient examined, no change in status, stable for surgery.  I have reviewed the patient's chart and labs.  Questions were answered to the patient's satisfaction.     Louann Sjogren

## 2022-03-05 NOTE — Op Note (Signed)
   OPERATIVE REPORT Patient name: Abigail Moran MRN: [id number] DOB: 1976/05/05  DOS:  03/05/22  Preop Dx: Sepsis, due to unspecified organism, unspecified whether acute organ dysfunction present (HCC)  Diabetic foot infection (HCC)  Diabetic ketoacidosis without coma associated with type 1 diabetes mellitus (HCC)  DKA, type 1 (HCC) Postop Dx: same  Procedure:  1. Right foot Irrigation and debridement with delayed primary closure.   Surgeon: Louann Sjogren, DPM  Anesthesia: 50-50 mixture of 2% lidocaine plain with 0.5% Marcaine plain totaling 10 infiltrated in the patient's right lower extremity  Hemostasis: No TQ necessary   EBL: <10 mL Materials: None Injectables: as above Pathology: Right foot wound culture   Condition: The patient tolerated the procedure and anesthesia well. No complications noted or reported   Justification for procedure: The patient is a 46 y.o. female who presents today for repeat wash out and delayed primary closure. All conservative modalities of been unsuccessful in providing any sort of satisfactory alleviation of symptoms with the patient. The patient was told benefits as well as possible side effects of the surgery. The patient consented for surgical correction. The patient consent form was reviewed. All patient questions were answered. No guarantees were expressed or implied. The patient and the surgeon both signed the patient consent form with the witness present and placed in the patient's chart.   Procedure in Detail: The patient was brought to the operating room, placed in the operating table in the supine position at which time an aseptic scrub and drape were performed about the patient's respective lower extremity after anesthesia was induced as described above. Attention was then directed to the surgical area where procedure number one commenced.  Procedure #1:   Attention was then directed to the latearl right foot in area of previous  incision. All necrotic tissue was debrided with 15 blade and rongeur. Area was irrigated with pulse lavage 1 liter of saline. After this a culture swab of wound was take. No purulence or residual signs of infection were noted and was decided to close incision. Skin closure with 3-0 nylon was preformed.     Dry sterile compressive dressings were then applied to all previously mentioned incision sites about the patient's lower extremity. The patient was then transferred from the operating room to the recovery room having tolerated the procedure and anesthesia well. All vital signs are stable. After a brief stay in the recovery room the patient was readmitted to the floor.    Disposition:  Patient is ok for discharge from podiatry standpoint. Will keep dressing clean dry and intact until follow-up in clinic in about a week. Recommend 2 weeks oral antibiotics upon discharge. Heel weight bearing as tolerated in surgical shoe.    Louann Sjogren, DPM [facility]  Dr. Louann Sjogren, DPM    [address] [place], [place] [address]                Office [phone]  Fax [phone]

## 2022-03-06 ENCOUNTER — Encounter (HOSPITAL_COMMUNITY): Payer: Self-pay | Admitting: Podiatry

## 2022-03-06 ENCOUNTER — Encounter: Payer: Self-pay | Admitting: Hematology & Oncology

## 2022-03-06 ENCOUNTER — Other Ambulatory Visit (HOSPITAL_COMMUNITY): Payer: Self-pay

## 2022-03-06 LAB — BASIC METABOLIC PANEL
Anion gap: 13 (ref 5–15)
BUN: 16 mg/dL (ref 6–20)
CO2: 20 mmol/L — ABNORMAL LOW (ref 22–32)
Calcium: 8.7 mg/dL — ABNORMAL LOW (ref 8.9–10.3)
Chloride: 102 mmol/L (ref 98–111)
Creatinine, Ser: 1.07 mg/dL — ABNORMAL HIGH (ref 0.44–1.00)
GFR, Estimated: >60 mL/min (ref >60)
Glucose, Bld: 315 mg/dL — ABNORMAL HIGH (ref 70–99)
Potassium: 4.5 mmol/L (ref 3.5–5.1)
Sodium: 135 mmol/L (ref 135–145)

## 2022-03-06 LAB — GLUCOSE, CAPILLARY
Glucose-Capillary: 346 mg/dL — ABNORMAL HIGH (ref 70–99)
Glucose-Capillary: 257 mg/dL — ABNORMAL HIGH (ref 70–99)
Glucose-Capillary: 359 mg/dL — ABNORMAL HIGH (ref 70–99)

## 2022-03-06 LAB — CBC
HCT: 28.1 % — ABNORMAL LOW (ref 36.0–46.0)
Hemoglobin: 8.9 g/dL — ABNORMAL LOW (ref 12.0–15.0)
MCH: 27.9 pg (ref 26.0–34.0)
MCHC: 31.7 g/dL (ref 30.0–36.0)
MCV: 88.1 fL (ref 80.0–100.0)
Platelets: 409 10*3/uL — ABNORMAL HIGH (ref 150–400)
RBC: 3.19 MIL/uL — ABNORMAL LOW (ref 3.87–5.11)
RDW: 14.2 % (ref 11.5–15.5)
WBC: 8.7 10*3/uL (ref 4.0–10.5)
nRBC: 0 % (ref 0.0–0.2)

## 2022-03-06 MED ORDER — "INSULIN SYRINGE-NEEDLE U-100 30G X 1/2"" 0.5 ML MISC"
3 refills | Status: DC
  Filled 2022-03-06: qty 100, 25d supply, fill #0

## 2022-03-06 MED ORDER — CEPHALEXIN 250 MG PO CAPS
250.0000 mg | ORAL_CAPSULE | Freq: Four times a day (QID) | ORAL | 0 refills | Status: AC
  Filled 2022-03-06: qty 56, 14d supply, fill #0

## 2022-03-06 MED ORDER — GLUCOSE BLOOD VI STRP
ORAL_STRIP | 0 refills | Status: DC
  Filled 2022-03-06: qty 100, 30d supply, fill #0

## 2022-03-06 MED ORDER — ACCU-CHEK SOFTCLIX LANCETS MISC
0 refills | Status: DC
  Filled 2022-03-06: qty 100, 30d supply, fill #0

## 2022-03-06 MED ORDER — BLOOD GLUCOSE MONITOR KIT: PACK | 0 refills | Status: DC

## 2022-03-06 MED ORDER — OXYCODONE-ACETAMINOPHEN 5-325 MG PO TABS: 1.0000 | ORAL_TABLET | ORAL | 0 refills | Status: DC | PRN

## 2022-03-06 MED ORDER — INSULIN NPH (HUMAN) (ISOPHANE) 100 UNIT/ML ~~LOC~~ SUSP
20.0000 [IU] | Freq: Two times a day (BID) | SUBCUTANEOUS | 1 refills | Status: DC
Start: 2022-03-06 — End: 2023-11-15
  Filled 2022-03-06 (×2): qty 10, 25d supply, fill #0

## 2022-03-06 MED ORDER — ACCU-CHEK GUIDE W/DEVICE KIT
PACK | 0 refills | Status: DC
  Filled 2022-03-06: qty 1, 30d supply, fill #0

## 2022-03-06 MED ORDER — INSULIN DETEMIR 100 UNIT/ML FLEXPEN
30.0000 [IU] | PEN_INJECTOR | Freq: Every day | SUBCUTANEOUS | 1 refills | Status: DC
  Filled 2022-03-06: qty 15, 50d supply, fill #0

## 2022-03-06 MED ORDER — PRAVASTATIN SODIUM 40 MG PO TABS
40.0000 mg | ORAL_TABLET | Freq: Every day | ORAL | 0 refills | Status: DC
  Filled 2022-03-06: qty 90, 90d supply, fill #0

## 2022-03-06 MED ORDER — INSULIN REGULAR HUMAN 100 UNIT/ML IJ SOLN
5.0000 [IU] | Freq: Three times a day (TID) | INTRAMUSCULAR | 1 refills | Status: DC
  Filled 2022-03-06: qty 10, 42d supply, fill #0
  Filled 2022-03-06: qty 10, 30d supply, fill #0

## 2022-03-06 NOTE — Progress Notes (Signed)
Subjective:  Patient ID: Abigail Moran, female    DOB: 12/10/1975,  MRN: [id number]  Patient seen at bedside this morning s/p I&D right foot with delayed primary closure. Relates she is doing well with minimal pain.   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   Heart attack (HCC) 05/04/15   History of DVT (deep vein thrombosis)    following hospitalization 2020   History of kidney stones    Menorrhagia with irregular cycle 10/11/2020   Renal disorder    Sepsis due to cellulitis (HCC) 05/15/2021   Vitamin D deficiency 09/14/2014     Past Surgical History:  Procedure Laterality Date   ABDOMINAL AORTOGRAM W/LOWER EXTREMITY N/A 03/01/2022   Procedure: ABDOMINAL AORTOGRAM W/LOWER EXTREMITY;  Surgeon: Cephus Shelling, MD;  Location: [facility] INVASIVE CV LAB;  Service: Cardiovascular;  Laterality: N/A;   CARDIAC CATHETERIZATION  2016   CHOLECYSTECTOMY  2001   WISDOM TOOTH EXTRACTION         Latest Ref Rng & Units 03/06/2022    3:35 AM 03/05/2022    5:30 AM 03/04/2022    2:46 PM  CBC  WBC 4.0 - 10.5 K/uL 8.7  6.7    Hemoglobin 12.0 - 15.0 g/dL 8.9  8.3  8.7   Hematocrit 36.0 - 46.0 % 28.1  26.2  27.4   Platelets 150 - 400 K/uL 409  383         Latest Ref Rng & Units 03/06/2022    3:35 AM 03/05/2022    5:30 AM 03/04/2022    2:24 AM  BMP  Glucose 70 - 99 mg/dL 703  500  938   BUN 6 - 20 mg/dL 16  12  11    Creatinine 0.44 - 1.00 mg/dL  1.82  9.93   Sodium 135 - 145 mmol/L 135  135  133   Potassium 3.5 - 5.1 mmol/L 4.5  3.9  3.7   Chloride 98 - 111 mmol/L 102  104  102   CO2 22 - 32 mmol/L 20  22  20    Calcium 8.9 - 10.3 mg/dL 8.7  8.3  7.9      Objective:   Vitals:   03/05/22 2353 03/06/22 0434  BP: (!) 145/87  112/73  Pulse: 98 98  Resp: 15 15  Temp: 98.3 F (36.8 C) 98.3 F (36.8 C)  SpO2: 97% 100%    General:AA&O x 3. Normal mood and affect   Vascular: DP and PT pulses 2/4 bilateral. Brisk capillary refill to all digits. Pedal hair present   Neruological. Epicritic sensation grossly intact.   Derm: Dressing clean dry and intact.   MSK: MMT 5/5 in dorsiflexion, plantar flexion, inversion and eversion. Normal joint ROM without pain or crepitus.      Assessment & Plan:  Patient was evaluated and treated and all questions answered.  S/p right foot I&D with delayed primary closure.  Antibiotics: Per primary will follow cultures  DME: post-op shoe  Patient is ok for discharge from podiatry standpoint. Will keep dressing clean dry and intact until follow-up in clinic in about a week. Recommend 2 weeks oral antibiotics upon discharge. Heel weight bearing as tolerated in surgical shoe.  Louann Sjogren, DPM  Accessible via secure chat for questions or concerns.

## 2022-03-06 NOTE — Discharge Summary (Signed)
Physician Discharge Summary  Koby Hartfield MOQ:[id number] DOB: 1975-11-27 DOA: 02/25/2022  PCP: [name], NP  Admit date: 02/25/2022 Discharge date: 03/06/2022  Admitted From: Home Disposition: Home  Recommendations for Outpatient Follow-up:  Follow up with PCP in 1-2 weeks Follow-up with surgery as scheduled  Home Health: None Equipment/Devices: Surgical shoe  Discharge Condition: Stable CODE STATUS: Full Diet recommendation: Low-carb low-fat diabetic diet  Brief/Interim Summary: 46 y.o. female with medical history significant of uncontrolled type I diabetes, medical noncompliance, gastroparesis, marijuana abuse, erythropoietin deficiency anemia, right foot cellulitis and abscess in 05/2021, DVT, nephrolithiasis, menorrhagia, CKD stage II-IIIa, vitamin D deficiency, CAD, hypertension, hyperlipidemia. Recently seen in the ED on 8/7 for right foot infection.  X-ray was negative for osteomyelitis and she was discharged with prescription for doxycycline.  She did not take the antibiotic and also noncompliant with insulin.  Returned to the ED with toe pain/worsening infection of her right foot. She was found to be in DKA.  She was hospitalized for further management. Podiatry consulted. Recommend transferring to [facility] for vascular surgery consultation and intervention prior to right fourth and fifth digit amputations. Consulted vascular surgery for evaluation (no further work-up per imaging) prior to amputation per podiatry.  Patient's imaging with vascular surgery was unremarkable, good blood flow noted to the right lower extremity no intervention required.  Podiatry amputated fourth and fifth right toes 03/02/2022 with ultimate closure on 03/05/2022.  Patient tolerated the procedures quite well, at this time, at this time is ambulating with abdominal, at this time is ambulating without difficulty and surgical shoe. Her glucose has been moderately well controlled here - but profoundly  uncontrolled in the outpatient setting due to noncompliance - presented in DKA. We attempted to transition to long acting insulin but was cost-prohibitive. Transition back to prior regimen but discussed at length need for compliance. I feel the patient will do very well with insulin pump/subcutaneous insulin monitor but again there are concerns about cost. She is otherwise stable for discharge to finish PO abx per podiatry with scheduled post op follow up per their schedule.   Discharge Diagnoses:   DKA, type 1 ([place]) uncontrolled with hyperglycemia/DKA  CAD (coronary artery disease)   Erythropoietin deficiency anemia   Hyperlipidemia   Hyponatremia, pseudohyponatremia   Sepsis ([place]), POA secondary to osteomyelitis/abscess of the R foot   Primary hypertension   Diabetic foot infection ([place]), meets sepsis criteria   Acute-on-chronic kidney injury ([place]) on CKD3a   Hypokalemia   Hypertension, essential   Medication noncompliance   DKA (diabetic ketoacidosis) ([place])   Morbid obesity   Discharge Instructions   Allergies as of 03/06/2022       Reactions   Latex Itching   Tears skin   Tape Hives        Medication List     STOP taking these medications    aspirin EC 81 MG tablet   atorvastatin 10 MG tablet Commonly known as: LIPITOR Replaced by: pravastatin 40 MG tablet   doxycycline 100 MG capsule Commonly known as: VIBRAMYCIN   insulin NPH Human 100 UNIT/ML injection Commonly known as: NOVOLIN N   insulin regular 100 units/mL injection Commonly known as: NOVOLIN R   lisinopril 10 MG tablet Commonly known as: ZESTRIL   Vitamin D (Ergocalciferol) 1.25 MG (50000 UNIT) Caps capsule Commonly known as: DRISDOL       TAKE these medications    acetaminophen 325 MG tablet Commonly known as: TYLENOL Take 325 mg by mouth daily as needed (pain).  blood glucose meter kit and supplies Kit Dispense based on patient and insurance preference. Use up to four times daily as  directed. E11.9   cephALEXin 250 MG capsule Commonly known as: KEFLEX Take 1 capsule (250 mg total) by mouth 4 (four) times daily for 14 days.   freestyle lancets Use 4x a day   FREESTYLE LITE test strip Generic drug: glucose blood USE FOUR TIMES DAILY   ibuprofen 200 MG tablet Commonly known as: ADVIL Take 400 mg by mouth daily as needed (pain).   Insulin Pen Needle 32G X 4 MM Misc Use 4x a day   Levemir FlexPen 100 UNIT/ML FlexPen Generic drug: insulin detemir Inject 30 Units into the skin daily.   multivitamin tablet Take 1 tablet by mouth daily.   oxyCODONE-acetaminophen 5-325 MG tablet Commonly known as: PERCOCET/ROXICET Take 1-2 tablets by mouth every 4 (four) hours as needed for severe pain.   pravastatin 40 MG tablet Commonly known as: PRAVACHOL Take 1 tablet (40 mg total) by mouth daily. Replaces: atorvastatin 10 MG tablet        Allergies  Allergen Reactions   Latex Itching    Tears skin   Tape Hives    Consultations: Podiatry, vascular surgery   Procedures/Studies: DG Foot Complete Right  Result Date: 03/02/2022 CLINICAL DATA:  Postoperative. EXAM: RIGHT FOOT COMPLETE - 3+ VIEW COMPARISON:  Right foot x-ray 02/25/2022 FINDINGS: There has been interval amputation of the distal fourth and fifth metatarsals and phalanges. Surgical margin appears normal. No fracture or dislocation. There is mild hallux valgus. There is soft tissue swelling of the forefoot. IMPRESSION: 1. Status post amputation of the distal fourth and fifth metatarsals in phalanges. Electronically Signed   By: Ronney Asters M.D.   On: 03/02/2022 22:58   PERIPHERAL VASCULAR CATHETERIZATION  Result Date: 03/01/2022 Patient name: Yehudit Fulginiti          MRN: [id number]        DOB: [birth year]            Sex: female  03/01/2022 Pre-operative Diagnosis: Right lower extremity tissue loss Post-operative diagnosis:  Same Surgeon:  Marty Heck, MD Procedure Performed: 1.  Ultrasound-guided  access left common femoral artery 2.  Aortogram with catheter selection of aorta 3.  Right lower extremity arteriogram with selection of second-order branches 4.  Mynx closure of the left common femoral artery 5.  19 minutes of monitored moderate conscious sedation time  Indications: Patient is a 46 year old female with diabetes that has developed tissue loss in the right foot.  She is scheduled for toe amputations with podiatry and vascular surgery was asked to evaluate inflow for adequacy of healing.  She presents after risks benefits discussed.  Findings:  Aortogram showed both renal arteries are patent bilaterally without flow limiting stenosis.  Her infrarenal aorta is patent.  Both iliac arteries are widely patent without flow-limiting stenosis.  Right lower extremity arteriogram showed a widely patent common femoral, profunda, SFA, popliteal and three-vessel runoff.  Flow was very brisk into the right foot.             Procedure:  The patient was identified in the holding area and taken to room 8.  The patient was then placed supine on the table and prepped and draped in the usual sterile fashion.  A time out was called.  Ultrasound was used to evaluate the left common femoral artery.  It was patent .  A digital ultrasound image was acquired.  A micropuncture needle  was used to access the left common femoral artery under ultrasound guidance.  An 018 wire was advanced without resistance and a micropuncture sheath was placed.  The 018 wire was removed and a benson wire was placed.  The micropuncture sheath was exchanged for a 5 french sheath.  An omniflush catheter was advanced over the wire to the level of L-1.  An abdominal angiogram was obtained.  Next, using the omniflush catheter and a benson wire, the aortic bifurcation was crossed and the catheter was placed into theright external iliac artery and right runoff was obtained.  Pertinent findings are noted above.  No intervention performed.  Wires and  catheters were removed.  Mynx closure device deployed in the left common femoral artery.  Pain: Patient has excellent flow down the right lower extremity with no stenosis and 3 vessel runoff and should have adequate inflow for healing toe amputations.  Marty Heck, MD [facility] Office: [phone]   MR FOOT RIGHT W WO CONTRAST  Result Date: 02/27/2022 CLINICAL DATA:  Foot swelling, diabetic, osteomyelitis suspected. Fourth and fifth toe infection EXAM: MRI OF THE RIGHT FOREFOOT WITHOUT AND WITH CONTRAST TECHNIQUE: Multiplanar, multisequence MR imaging of the right forefoot was performed before and after the administration of intravenous contrast. CONTRAST:  38m GADAVIST GADOBUTROL 1 MMOL/ML IV SOLN COMPARISON:  X-ray 02/25/2022, MRI 05/17/2021 FINDINGS: Bones/Joint/Cartilage Bone marrow edema and enhancement of the proximal, middle, and distal phalanx of the fifth toe with confluent low T1 marrow signal changes compatible with acute osteomyelitis. There is mild bone marrow edema within the fifth metatarsal head and neck with preservation of the fatty T1 marrow signal. Small fifth MTP joint effusion, nonspecific Additional findings of acute osteomyelitis involving the proximal, middle, and distal phalanx of the fourth toe. Faint marrow edema within the fourth metatarsal head, likely reactive No additional sites of osteomyelitis are evident within the forefoot. No acute fracture or dislocation. Mild degenerative changes, most notably at the first MTP joint. Ligaments Intact Lisfranc ligament. No evidence of collateral ligament injury. Muscles and Tendons Denervation changes of the foot musculature. Tenosynovitis involving the flexor and extensor tendons of the fifth toe distally. No additional sites of tenosynovitis. Soft tissues Ulceration of the fifth toe with prominent soft tissue swelling and edema/enhancement. Abscess along the dorsal lateral aspect of the fifth toe  extending proximally to the level of the fifth metatarsal neck measuring approximately 4.2 x 1.1 x 2.5 cm. Areas of tissue hypoenhancement of fifth toe and underlying the plantar aspect of the fifth metatarsal head concerning for tissue necrosis. Mild first intermetatarsal space bursitis. Diffuse soft tissue swelling and edema throughout the forefoot. IMPRESSION: 1. Ulceration of the fifth toe with extensive cellulitis. Abscess along the dorsolateral aspect of the fifth toe extending proximally to the level of the fifth metatarsal neck measuring approximately 4.2 x 1.1 x 2.5 cm. 2. Acute osteomyelitis throughout the fourth and fifth toes. Mild bone marrow edema within the fourth and fifth metatarsal heads, which may reflect reactive osteitis or early acute osteomyelitis. 3. Small fifth MTP joint effusion, which could be reactive or reflect septic arthritis. Electronically Signed   By: NDavina PokeD.O.   On: 02/27/2022 08:51   VAS [place] WITH/WO TBI  Result Date: 02/27/2022  LOWER EXTREMITY DOPPLER STUDY Patient Name:  TJANETH TERRY Date of Exam:   02/26/2022 Medical Rec #: [id number]      Accession #:    [id number] of Birth: 4November 30, 1977  Patient Gender: F Patient Age:   36 years Exam Location:  [facility] Procedure:      VAS [place] ABI WITH/WO TBI Referring Phys: Bonnielee Haff --------------------------------------------------------------------------------  Indications: Ulceration. High Risk Factors: Hyperlipidemia, Diabetes, coronary artery disease.  Comparison Study: No previous exam noted. Performing Technologist: Bobetta Lime BS, RVT  Examination Guidelines: A complete evaluation includes at minimum, Doppler waveform signals and systolic blood pressure reading at the level of bilateral brachial, anterior tibial, and posterior tibial arteries, when vessel segments are accessible. Bilateral testing is considered an integral part of a complete examination. Photoelectric Plethysmograph  (PPG) waveforms and toe systolic pressure readings are included as required and additional duplex testing as needed. Limited examinations for reoccurring indications may be performed as noted.  ABI Findings: +---------+------------------+-----+-----------+-------------------------------+ Right    Rt Pressure (mmHg)IndexWaveform   Comment                         +---------+------------------+-----+-----------+-------------------------------+ Brachial 124                    triphasic                                  +---------+------------------+-----+-----------+-------------------------------+ PTA      142               1.13 multiphasicHyperemic waveform well above                                              the baseline                    +---------+------------------+-----+-----------+-------------------------------+ DP       142               1.13 monophasic Hyperemic waveform well above                                              the baseline, monophasic with                                              preserved upstroke              +---------+------------------+-----+-----------+-------------------------------+ Great Toe75                0.60                                            +---------+------------------+-----+-----------+-------------------------------+ +---------+------------------+-----+-----------+-------+ Left     Lt Pressure (mmHg)IndexWaveform   Comment +---------+------------------+-----+-----------+-------+ Brachial 126                    triphasic          +---------+------------------+-----+-----------+-------+ PTA      136               1.08 multiphasic        +---------+------------------+-----+-----------+-------+ DP  148               1.17 multiphasic        +---------+------------------+-----+-----------+-------+ Great Toe79                0.63                     +---------+------------------+-----+-----------+-------+ +-------+-----------+-----------+------------+------------+ ABI/TBIToday's ABIToday's TBIPrevious ABIPrevious TBI +-------+-----------+-----------+------------+------------+ Right  1.13       0.60                                +-------+-----------+-----------+------------+------------+ Left   1.17       0.63                                +-------+-----------+-----------+------------+------------+  Summary: Right: Resting right ankle-brachial index is within normal range. The right toe-brachial index is abnormal. Hyperemic waveforms noted in the right posterior tibial and dorsalis pedis arteries. Left: Resting left ankle-brachial index is within normal range. The left toe-brachial index is abnormal. *See table(s) above for measurements and observations.  Electronically signed by Orlie Pollen on 02/27/2022 at 4:12:44 AM.    Final    DG Foot Complete Right  Result Date: 02/25/2022 CLINICAL DATA:  Right foot pain EXAM: RIGHT FOOT COMPLETE - 3+ VIEW COMPARISON:  02/12/2022 FINDINGS: There is no evidence of fracture or dislocation. There is no evidence of arthropathy or other focal bone abnormality. Soft tissues are unremarkable. IMPRESSION: Negative. Electronically Signed   By: Rolm Baptise M.D.   On: 02/25/2022 21:13   DG Foot Complete Right  Result Date: 02/12/2022 CLINICAL DATA:  Pain EXAM: RIGHT FOOT COMPLETE - 3+ VIEW COMPARISON:  Right foot radiographs 05/15/2021 FINDINGS: There is no evidence of fracture or dislocation. There is no evidence of arthropathy or other focal bone abnormality. Dorsal soft tissue swelling. IMPRESSION: 1. No acute osseous abnormality. 2. Dorsal soft tissue swelling. Electronically Signed   By: Audie Pinto M.D.   On: 02/12/2022 17:39     Subjective: No acute issues/events overnight   Discharge Exam: Vitals:   03/06/22 0434 03/06/22 0847  BP: 112/73 97/60  Pulse: 98 98  Resp: 15   Temp:  98.3 F (36.8 C) 98.3 F (36.8 C)  SpO2: 100% 98%   Vitals:   03/05/22 2034 03/05/22 2353 03/06/22 0434 03/06/22 0847  BP: 134/86 (!) 145/87 112/73 97/60  Pulse: 93 98 98 98  Resp: '15 15 15   ' Temp: 97.6 F (36.4 C) 98.3 F (36.8 C) 98.3 F (36.8 C) 98.3 F (36.8 C)  TempSrc: Oral Oral Oral Oral  SpO2: 96% 97% 100% 98%  Weight:      Height:        General: Pt is alert, awake, not in acute distress Cardiovascular: RRR, S1/S2 +, no rubs, no gallops Respiratory: CTA bilaterally, no wheezing, no rhonchi Abdominal: Soft, NT, ND, bowel sounds + Extremities: R foot bandage clean/dry/intact   The results of significant diagnostics from this hospitalization (including imaging, microbiology, ancillary and laboratory) are listed below for reference.     Microbiology: Recent Results (from the past 240 hour(s))  Culture, blood (routine x 2)     Status: None   Collection Time: 02/25/22  9:58 PM   Specimen: BLOOD LEFT ARM  Result Value Ref Range Status   Specimen Description   Final  BLOOD LEFT ARM Performed at [facility], [address] [place], [place] [address]    Special Requests   Final    BOTTLES DRAWN AEROBIC AND ANAEROBIC Blood Culture adequate volume Performed at [facility], [address] [place], [place] [address]    Culture   Final    NO GROWTH 5 DAYS Performed at [facility], [place] [address] [place], [place] [address]    Report Status 03/03/2022 FINAL  Final  Culture, blood (routine x 2)     Status: None   Collection Time: 02/25/22 10:29 PM   Specimen: BLOOD RIGHT ARM  Result Value Ref Range Status   Specimen Description   Final    BLOOD RIGHT ARM Performed at [facility], [address] [place], [place] [address]    Special Requests   Final    BOTTLES DRAWN AEROBIC AND ANAEROBIC Blood Culture adequate volume Performed at [facility], [address], [place], [place] [address]    Culture   Final     NO GROWTH 5 DAYS Performed at [facility], [place] [address] [place], [place] [address]    Report Status 03/03/2022 FINAL  Final  MRSA Next Gen by PCR, Nasal     Status: None   Collection Time: 02/26/22  1:19 AM   Specimen: Nasal Mucosa; Nasal Swab  Result Value Ref Range Status   MRSA by PCR Next Gen NOT DETECTED NOT DETECTED Final    Comment: (NOTE) The GeneXpert MRSA Assay (FDA approved for NASAL specimens only), is one component of a comprehensive MRSA colonization surveillance program. It is not intended to diagnose MRSA infection nor to guide or monitor treatment for MRSA infections. Test performance is not FDA approved in patients less than 91 years old. Performed at [facility], [place] [address], [place], [place] [address]   Fungus Culture With Stain     Status: None (Preliminary result)   Collection Time: 03/02/22  7:16 PM   Specimen: Foot, Right; Abscess  Result Value Ref Range Status   Fungus Stain Final report  Final    Comment: (NOTE) Performed At: [facility] [place], [place] [id number] Rush Farmer MD ID:[id number]    Fungus (Mycology) Culture PENDING  Incomplete   Fungal Source ABSCESS  Final    Comment: FOOT RIGHT Performed at [facility], [place] [address] [place], [place] [address]   Aerobic/Anaerobic Culture w Gram Stain (surgical/deep wound)     Status: None (Preliminary result)   Collection Time: 03/02/22  7:16 PM   Specimen: Foot, Right; Abscess  Result Value Ref Range Status   Specimen Description ABSCESS RIGHT FOOT  Final   Special Requests NONE  Final   Gram Stain   Final    RARE WBC PRESENT,BOTH PMN AND MONONUCLEAR RARE GRAM POSITIVE COCCI IN PAIRS Performed at [facility], [place] [address] [place], [place] [address]    Culture   Final    RARE GROUP B STREP(S.AGALACTIAE)ISOLATED TESTING AGAINST S. AGALACTIAE NOT ROUTINELY PERFORMED DUE TO PREDICTABILITY OF AMP/PEN/VAN  SUSCEPTIBILITY. WITHIN MIXED ORGANISMS NO ANAEROBES ISOLATED; CULTURE IN PROGRESS FOR 5 DAYS    Report Status PENDING  Incomplete  Fungus Culture Result     Status: None   Collection Time: 03/02/22  7:16 PM  Result Value Ref Range Status   Result 1 Comment  Final    Comment: (NOTE) KOH/Calcofluor  preparation:  no fungus observed. Performed At: [facility] [place], [place] [id number] Rush Farmer MD QQ:[id number]   Aerobic/Anaerobic Culture w Gram Stain (surgical/deep wound)     Status: None (Preliminary result)   Collection Time: 03/05/22  5:42 PM   Specimen: PATH Other; Tissue  Result Value Ref Range Status   Specimen Description WOUND  Final   Special Requests RIGHT FOOT  Final   Gram Stain NO WBC SEEN NO ORGANISMS SEEN   Final   Culture   Final    NO GROWTH < 24 HOURS Performed at [facility], [place] [address] [place], [place] [address]    Report Status PENDING  Incomplete     Labs: BNP (last 3 results) No results for input(s): "BNP" in the last 8760 hours. Basic Metabolic Panel: Recent Labs  Lab 03/02/22 0333 03/03/22 0431 03/04/22 0224 03/05/22 0530 03/06/22 0335  NA 136 130* 133* 135 135  K 4.2 4.3 3.7 3.9 4.5  CL 109 100 102 104 102  CO2 21* 23 20* 22 20*  GLUCOSE 108* 431* 257* 306* 315*  BUN '9 10 11 12 16  ' CREATININE 1.04* 1.04* 1.11* 1.26* 1.07*  CALCIUM 7.9* 8.0* 7.9* 8.3* 8.7*  MG 2.1  --   --   --   --    Liver Function Tests: No results for input(s): "AST", "ALT", "ALKPHOS", "BILITOT", "PROT", "ALBUMIN" in the last 168 hours. No results for input(s): "LIPASE", "AMYLASE" in the last 168 hours. No results for input(s): "AMMONIA" in the last 168 hours. CBC: Recent Labs  Lab 03/02/22 0333 03/03/22 0431 03/04/22 0224 03/04/22 1446 03/05/22 0530 03/06/22 0335  WBC 8.8 7.4 8.0  --  6.7 8.7  HGB 8.0* 9.1* 7.6* 8.7* 8.3* 8.9*  HCT 24.7* 28.5* 23.9* 27.4* 26.2* 28.1*  MCV 87.6 88.5 88.5  --  88.5 88.1  PLT 318  306 313  --  383 409*   Cardiac Enzymes: No results for input(s): "CKTOTAL", "CKMB", "CKMBINDEX", "TROPONINI" in the last 168 hours. BNP: Invalid input(s): "POCBNP" CBG: Recent Labs  Lab 03/05/22 1055 03/05/22 1556 03/05/22 1756 03/05/22 2125 03/06/22 0637  GLUCAP 210* 102* 98 280* 346*   D-Dimer No results for input(s): "DDIMER" in the last 72 hours. Hgb A1c No results for input(s): "HGBA1C" in the last 72 hours. Lipid Profile No results for input(s): "CHOL", "HDL", "LDLCALC", "TRIG", "CHOLHDL", "LDLDIRECT" in the last 72 hours. Thyroid function studies No results for input(s): "TSH", "T4TOTAL", "T3FREE", "THYROIDAB" in the last 72 hours.  Invalid input(s): "FREET3" Anemia work up No results for input(s): "VITAMINB12", "FOLATE", "FERRITIN", "TIBC", "IRON", "RETICCTPCT" in the last 72 hours. Urinalysis    Component Value Date/Time   COLORURINE YELLOW 02/26/2022 0259   APPEARANCEUR CLEAR 02/26/2022 0259   LABSPEC 1.011 02/26/2022 0259   PHURINE 5.0 02/26/2022 0259   GLUCOSEU >=500 (A) 02/26/2022 0259   GLUCOSEU >=1000 (A) 08/08/2016 0813   HGBUR LARGE (A) 02/26/2022 0259   BILIRUBINUR NEGATIVE 02/26/2022 0259   KETONESUR 80 (A) 02/26/2022 0259   PROTEINUR 100 (A) 02/26/2022 0259   UROBILINOGEN 0.2 08/08/2016 0813   NITRITE NEGATIVE 02/26/2022 0259   LEUKOCYTESUR NEGATIVE 02/26/2022 0259   Sepsis Labs Recent Labs  Lab 03/03/22 0431 03/04/22 0224 03/05/22 0530 03/06/22 0335  WBC 7.4 8.0 6.7 8.7   Microbiology Recent Results (from the past 240 hour(s))  Culture, blood (routine x 2)     Status: None   Collection Time: 02/25/22  9:58 PM   Specimen:  BLOOD LEFT ARM  Result Value Ref Range Status   Specimen Description   Final    BLOOD LEFT ARM Performed at [facility], [address] [place], [place] [address]    Special Requests   Final    BOTTLES DRAWN AEROBIC AND ANAEROBIC Blood Culture adequate volume Performed at [facility], [address] [place], [place] [address]    Culture   Final    NO GROWTH 5 DAYS Performed at [facility], [place] [address] [place], [place] [address]    Report Status 03/03/2022 FINAL  Final  Culture, blood (routine x 2)     Status: None   Collection Time: 02/25/22 10:29 PM   Specimen: BLOOD RIGHT ARM  Result Value Ref Range Status   Specimen Description   Final    BLOOD RIGHT ARM Performed at [facility], [address] [place], [place] [address]    Special Requests   Final    BOTTLES DRAWN AEROBIC AND ANAEROBIC Blood Culture adequate volume Performed at [facility], [address] [place], [place] [address]    Culture   Final    NO GROWTH 5 DAYS Performed at [facility], [place] [address] [place], [place] [address]    Report Status 03/03/2022 FINAL  Final  MRSA Next Gen by PCR, Nasal     Status: None   Collection Time: 02/26/22  1:19 AM   Specimen: Nasal Mucosa; Nasal Swab  Result Value Ref Range Status   MRSA by PCR Next Gen NOT DETECTED NOT DETECTED Final    Comment: (NOTE) The GeneXpert MRSA Assay (FDA approved for NASAL specimens only), is one component of a comprehensive MRSA colonization surveillance program. It is not intended to diagnose MRSA infection nor to guide or monitor treatment for MRSA infections. Test performance is not FDA approved in patients less than 73 years old. Performed at [facility], [place] [address] [place], [place] [address]   Fungus Culture With Stain     Status: None (Preliminary result)   Collection Time: 03/02/22  7:16 PM   Specimen: Foot, Right; Abscess  Result Value Ref Range Status   Fungus Stain Final report  Final    Comment: (NOTE) Performed At: [facility] [place], [place] [id number] Rush Farmer MD [id number]    Fungus (Mycology) Culture PENDING  Incomplete   Fungal Source ABSCESS  Final    Comment: FOOT RIGHT Performed at [facility], [place] [address], [place], [place] [address]   Aerobic/Anaerobic Culture w Gram Stain (surgical/deep wound)     Status: None (Preliminary result)   Collection Time: 03/02/22  7:16 PM   Specimen: Foot, Right; Abscess  Result Value Ref Range Status   Specimen Description ABSCESS RIGHT FOOT  Final   Special Requests NONE  Final   Gram Stain   Final    RARE WBC PRESENT,BOTH PMN AND MONONUCLEAR RARE GRAM POSITIVE COCCI IN PAIRS Performed at [facility], [place] [address] [place], [place] [address]    Culture   Final    RARE GROUP B STREP(S.AGALACTIAE)ISOLATED TESTING AGAINST S. AGALACTIAE NOT ROUTINELY PERFORMED DUE TO PREDICTABILITY OF AMP/PEN/VAN SUSCEPTIBILITY. WITHIN MIXED ORGANISMS NO ANAEROBES ISOLATED; CULTURE IN PROGRESS FOR 5 DAYS    Report Status PENDING  Incomplete  Fungus Culture Result     Status: None   Collection Time: 03/02/22  7:16 PM  Result Value Ref Range Status   Result 1 Comment  Final    Comment: (NOTE) KOH/Calcofluor preparation:  no fungus observed. Performed At: [facility] [place], [place] [id number] Rush Farmer MD [id number]   Aerobic/Anaerobic Culture w Gram Stain (surgical/deep wound)     Status: None (Preliminary result)   Collection Time: 03/05/22  5:42 PM   Specimen: PATH Other; Tissue  Result Value Ref Range Status   Specimen Description WOUND  Final   Special Requests RIGHT FOOT  Final   Gram Stain NO WBC SEEN NO ORGANISMS SEEN   Final   Culture   Final    NO GROWTH < 24 HOURS Performed at [facility], [place] [address], [place], [place] [address]    Report Status PENDING  Incomplete     Time coordinating discharge: Over 30 minutes  SIGNED:   Little Ishikawa, DO [facility] 03/06/2022, 10:39 AM Pager   If 7PM-7AM, please contact night-coverage [email or web address]

## 2022-03-06 NOTE — TOC Transition Note (Signed)
Transition of Care (TOC) - CM/SW Discharge Note Donn Pierini RN, BSN Transitions of Care Unit 4E- RN Case Manager See Treatment Team for direct phone #    Patient Details  Name: Abigail Moran MRN: [id number] Date of Birth: Mar 28, 1976  Transition of Care [facility]) CM/SW Contact:  Darrold Span, RN Phone Number: 03/06/2022, 2:07 PM   Clinical Narrative:    Pt stable for transition home today, eligible for MATCH- CM has placed pt into [facility] system for medication assistance. TOC pharmacy to fill meds and deliver to patient prior to discharge home.  No further TOC needs noted.   RNCM will sign off for now as intervention is no longer needed. Please re-consult  if new needs arise, or contact RNCM assigned to treatment team for further questions/concerns.      Final next level of care: Home/Self Care Barriers to Discharge: Inadequate or no insurance, Continued Medical Work up   Patient Goals and CMS Choice Patient states their goals for this hospitalization and ongoing recovery are:: return home   Choice offered to / list presented to : NA  Discharge Placement               Home        Discharge Plan and Services   Discharge Planning Services: CM Consult, MATCH Program, Other - See comment (insurance)            DME Arranged: N/A DME Agency: NA       HH Arranged: NA HH Agency: NA        Social Determinants of Health (SDOH) Interventions     Readmission Risk Interventions    03/06/2022    2:06 PM  Readmission Risk Prevention Plan  Transportation Screening Complete  Home Care Screening Complete  Medication Review (RN CM) Complete

## 2022-03-06 NOTE — Anesthesia Postprocedure Evaluation (Signed)
Anesthesia Post Note  Patient: Mudlogger  Procedure(s) Performed: IRRIGATION AND DEBRIDEMENT AND CLOSURE RIGHT FOOT (Right)     Patient location during evaluation: PACU Anesthesia Type: General Level of consciousness: awake and alert Pain management: pain level controlled Vital Signs Assessment: post-procedure vital signs reviewed and stable Respiratory status: spontaneous breathing, nonlabored ventilation, respiratory function stable and patient connected to nasal cannula oxygen Cardiovascular status: blood pressure returned to baseline and stable Postop Assessment: no apparent nausea or vomiting Anesthetic complications: no   No notable events documented.            Shelton Silvas

## 2022-03-06 NOTE — Plan of Care (Signed)
Discussed follow up with Dr. Ralene Cork. [facility] will call the patient and follow up within 1 week.  Please call the office at [phone] if you have any questions or concerns.   Keep your dressing clean and dry, wear your boot and keep weight on your heel. You will be on oral antibiotics for 2 weeks.   Discharge instructions discussed with patient and family at bedside.  Patient instructed on home medications, restrictions, and follow up appointments. Belongings gathered and sent with patient.  Patients medications at bedside and 2 prescriptions given as well.   Patient discharged via wheelchair by this Clinical research associate

## 2022-03-06 NOTE — Discharge Instructions (Signed)
Discussed follow up with Dr. Ralene Cork. [facility] will call the patient and follow up within 1 week.  Please call the office at [phone] if you have any questions or concerns.   Keep your dressing clean and dry, wear your boot and keep weight on your heel. You will be on oral antibiotics for 2 weeks.

## 2022-03-07 ENCOUNTER — Telehealth: Payer: Self-pay

## 2022-03-07 ENCOUNTER — Encounter: Payer: Self-pay | Admitting: Hematology & Oncology

## 2022-03-07 LAB — AEROBIC/ANAEROBIC CULTURE W GRAM STAIN (SURGICAL/DEEP WOUND)

## 2022-03-07 NOTE — Telephone Encounter (Signed)
Transition Care Management Follow-up Telephone Call Date of discharge and from where: TCM [facility] 03-06-22 Dx: DKA, Type 1 How have you been since you were released from the hospital? Doing good  Any questions or concerns? No  Items Reviewed: Did the pt receive and understand the discharge instructions provided? Yes  Medications obtained and verified? Yes  Other? No  Any new allergies since your discharge? No  Dietary orders reviewed? Yes Do you have support at home? Yes   Home Care and Equipment/Supplies: Were home health services ordered? no If so, what is the name of the agency? na  Has the agency set up a time to come to the patient's home? not applicable Were any new equipment or medical supplies ordered?  No What is the name of the medical supply agency? na Were you able to get the supplies/equipment? not applicable Do you have any questions related to the use of the equipment or supplies? No  Functional Questionnaire: (I = Independent and D = Dependent) ADLs: I  Bathing/Dressing- I  Meal Prep- I  Eating- I  Maintaining continence- I  Transferring/Ambulation- I- CANE   Managing Meds- I  Follow up appointments reviewed:  PCP Hospital f/u appt confirmed? No- pt is going out of town from 9-3 thru 03-18-22- Marlana Latus does not have any available time-pt is [facility]? Yes  Scheduled to see Gala Lewandowsky  on 03-19-22 @ 145pm. Are transportation arrangements needed? No  If their condition worsens, is the pt aware to call PCP or go to the Emergency Dept.? Yes Was the patient provided with contact information for the PCP's office or ED? Yes Was to pt encouraged to call back with questions or concerns? Yes

## 2022-03-08 NOTE — Telephone Encounter (Signed)
Please make sure pt get's schedule for follow up visit with me. tks

## 2022-03-08 NOTE — Telephone Encounter (Signed)
Pt scheduled 9/11

## 2022-03-10 LAB — AEROBIC/ANAEROBIC CULTURE W GRAM STAIN (SURGICAL/DEEP WOUND)
Culture: NO GROWTH
Gram Stain: NONE SEEN

## 2022-03-19 ENCOUNTER — Telehealth: Payer: Self-pay | Admitting: Family

## 2022-03-19 ENCOUNTER — Ambulatory Visit (INDEPENDENT_AMBULATORY_CARE_PROVIDER_SITE_OTHER): Payer: Self-pay

## 2022-03-19 ENCOUNTER — Ambulatory Visit (INDEPENDENT_AMBULATORY_CARE_PROVIDER_SITE_OTHER): Payer: Self-pay | Admitting: Podiatry

## 2022-03-19 ENCOUNTER — Ambulatory Visit (INDEPENDENT_AMBULATORY_CARE_PROVIDER_SITE_OTHER): Payer: Self-pay | Admitting: Family

## 2022-03-19 VITALS — BP 93/68 | HR 91 | Temp 98.1°F | Resp 16 | Wt 198.0 lb

## 2022-03-19 DIAGNOSIS — E559 Vitamin D deficiency, unspecified: Secondary | ICD-10-CM

## 2022-03-19 DIAGNOSIS — D500 Iron deficiency anemia secondary to blood loss (chronic): Secondary | ICD-10-CM

## 2022-03-19 DIAGNOSIS — N179 Acute kidney failure, unspecified: Secondary | ICD-10-CM

## 2022-03-19 DIAGNOSIS — K3184 Gastroparesis: Secondary | ICD-10-CM

## 2022-03-19 DIAGNOSIS — E785 Hyperlipidemia, unspecified: Secondary | ICD-10-CM

## 2022-03-19 DIAGNOSIS — E11628 Type 2 diabetes mellitus with other skin complications: Secondary | ICD-10-CM

## 2022-03-19 DIAGNOSIS — D649 Anemia, unspecified: Secondary | ICD-10-CM

## 2022-03-19 DIAGNOSIS — L089 Local infection of the skin and subcutaneous tissue, unspecified: Secondary | ICD-10-CM

## 2022-03-19 DIAGNOSIS — N1831 Chronic kidney disease, stage 3a: Secondary | ICD-10-CM

## 2022-03-19 DIAGNOSIS — Z9889 Other specified postprocedural states: Secondary | ICD-10-CM

## 2022-03-19 DIAGNOSIS — E1065 Type 1 diabetes mellitus with hyperglycemia: Secondary | ICD-10-CM

## 2022-03-19 LAB — CBC WITH DIFFERENTIAL/PLATELET
Basophils Absolute: 0.1 10*3/uL (ref 0.0–0.1)
Basophils Relative: 2.2 % (ref 0.0–3.0)
Eosinophils Absolute: 0.1 10*3/uL (ref 0.0–0.7)
Eosinophils Relative: 4 % (ref 0.0–5.0)
HCT: 30.7 % — ABNORMAL LOW (ref 36.0–46.0)
Hemoglobin: 9.9 g/dL — ABNORMAL LOW (ref 12.0–15.0)
Lymphocytes Relative: 38 % (ref 12.0–46.0)
Lymphs Abs: 1.1 10*3/uL (ref 0.7–4.0)
MCHC: 32.3 g/dL (ref 30.0–36.0)
MCV: 87.5 fl (ref 78.0–100.0)
Monocytes Absolute: 0.2 10*3/uL (ref 0.1–1.0)
Monocytes Relative: 7.1 % (ref 3.0–12.0)
Neutro Abs: 1.4 10*3/uL (ref 1.4–7.7)
Neutrophils Relative %: 48.7 % (ref 43.0–77.0)
Platelets: 386 10*3/uL (ref 150.0–400.0)
RBC: 3.51 Mil/uL — ABNORMAL LOW (ref 3.87–5.11)
RDW: 15.8 % — ABNORMAL HIGH (ref 11.5–15.5)
WBC: 2.9 10*3/uL — ABNORMAL LOW (ref 4.0–10.5)

## 2022-03-19 LAB — BASIC METABOLIC PANEL
BUN: 23 mg/dL (ref 6–23)
CO2: 23 mEq/L (ref 19–32)
Calcium: 9.2 mg/dL (ref 8.4–10.5)
Chloride: 101 mEq/L (ref 96–112)
Creatinine, Ser: 1.18 mg/dL (ref 0.40–1.20)
GFR: 55.03 mL/min — ABNORMAL LOW (ref >60.00)
Glucose, Bld: 232 mg/dL — ABNORMAL HIGH (ref 70–99)
Potassium: 4.1 mEq/L (ref 3.5–5.1)
Sodium: 134 mEq/L — ABNORMAL LOW (ref 135–145)

## 2022-03-19 LAB — VITAMIN D 25 HYDROXY (VIT D DEFICIENCY, FRACTURES): VITD: 15.9 ng/mL — ABNORMAL LOW (ref 30.00–100.00)

## 2022-03-19 NOTE — Assessment & Plan Note (Signed)
S/p debridement/removal of right 4th and 5th toes. Has follow up today with podiatry for wound evaluation.

## 2022-03-19 NOTE — Progress Notes (Signed)
   Chief Complaint  Patient presents with   Post-op Follow-up    Patient is here for 1 week follow-up. ( Dr. Ralene Cork) right foot.    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 DOS: 03/03/2022.  She underwent subsequent irrigation and debridement with delayed primary closure on 03/05/2022.  Patient states that she is feeling well.  She says that the pain has improved significantly.  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   Heart attack (HCC) 05/04/15   History of DVT (deep vein thrombosis)    following hospitalization 2020   History of kidney stones    Menorrhagia with irregular cycle 10/11/2020   Renal disorder    Sepsis due to cellulitis (HCC) 05/15/2021   Vitamin D deficiency 09/14/2014    Past Surgical History:  Procedure Laterality Date   ABDOMINAL AORTOGRAM W/LOWER EXTREMITY N/A 03/01/2022   Procedure: ABDOMINAL AORTOGRAM W/LOWER EXTREMITY;  Surgeon: Cephus Shelling, MD;  Location: [facility] IN[facility];  Service: Cardiovascular;  Laterality: N/A;   AMPUTATION Right 03/02/2022   Procedure: AMPUTATION OF FOURTH AND FIFTH TOES, Irrigation and debridment;  Surgeon: Felecia Shelling, DPM;  Location: [facility] OR;  Service: Podiatry;  Laterality: Right;   CARDIAC CATHETERIZATION  2016   CHOLECYSTECTOMY  2001   I & D EXTREMITY Right 03/05/2022   Procedure: IRRIGATION AND DEBRIDEMENT AND CLOSURE RIGHT FOOT;  Surgeon: Louann Sjogren, DPM;  Location: [facility] OR;  Service: Podiatry;  Laterality: Right;   WISDOM TOOTH EXTRACTION      Allergies  Allergen Reactions   Latex Itching    Tears skin   Tape  Hives    Objective/Physical Exam Neurovascular status intact.  Skin incisions appear to be well coapted with sutures  intact. No sign of infectious process noted. No dehiscence. No active bleeding noted.  Negative for any significant edema to the foot  Radiographic Exam:  Patient of the fourth and fifth digits noted right foot with clean osteotomies of the metatarsal heads fourth and fifth.  Assessment: 1. 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 DOS: 03/03/2022   Plan of Care:  1. Patient was evaluated. X-rays reviewed 2.  Overall the patient is doing very well.  She may begin washing and showering and getting the foot wet 3.  Continue weightbearing in the postsurgical shoe 4.  Return to clinic in 2 weeks for suture removal   Felecia Shelling, DPM [facility]  Dr. Felecia Shelling, DPM    [address] [place], [place] [address]                Office [phone]  Fax [phone]

## 2022-03-19 NOTE — Telephone Encounter (Signed)
Please call [facility] in [place] and request last DM eye exam.

## 2022-03-19 NOTE — Assessment & Plan Note (Signed)
Lab Results  Component Value Date   CHOL 224 (H) 03/31/2021   HDL 60.10 03/31/2021   LDLCALC 93 01/29/2020   LDLDIRECT 117.0 03/31/2021   TRIG 202.0 (H) 03/31/2021   CHOLHDL 4 03/31/2021   Not taking pravastin.  Advised her to restart.

## 2022-03-19 NOTE — Telephone Encounter (Signed)
Request will befaxed 

## 2022-03-19 NOTE — Assessment & Plan Note (Addendum)
Overall stable/improved following discontinuation of marijuana use. Monitor.

## 2022-03-19 NOTE — Progress Notes (Signed)
Subjective:   By signing my name below, I, Shehryar Baig, attest that this documentation has been prepared under the direction and in the presence of [name], NP 03/19/2022    Patient ID: Abigail Moran, female    DOB: 1976-05-25, 46 y.o.   MRN: [id number]  Chief Complaint  Patient presents with  . Hospitalization Follow-up    Here for hospital follow up    HPI Patient is in today for a ED follow up. She has not been seen in some time due to loss of medical insurance.   Foot infection-She was admitted to the hospital from the ED on 02/25/2022 for sepsis and diabetic foot infection. She was unable to tolerate doxycycline due to GI side effects that she was given prior to admission. Symptoms worsened.  She was not compliant with her insulin during that time due to nausea/vomiting from the doxycycline.  She was admitted for DKA, and worsening right diabetic foot infection.  She underwent amputation of right 4th and 5th toes on 8/25, with closure on 03/05/22. She has a follow up appointment with her podiatrist this afternoon.   DM 2-Prior to her hospital visit her blood sugars were slightly elevated but stable. She has seen an endocrinologist in the past and is interested setting up a follow up appointment. She uses a sliding scale novolin insulin. She reports taking one injection for a while due to having abdominal pain while taking it. She denied having nausea or abdominal pain. She also notes taking marijuana regularly at the same time and found her symptoms stopped after she stopped smoking it.  Lab Results  Component Value Date   HGBA1C 13.1 (H) 02/26/2022   Cholesterol- She is not taking pravastatin at this time.  Lab Results  Component Value Date   CHOL 224 (H) 03/31/2021   HDL 60.10 03/31/2021   LDLCALC 93 01/29/2020   LDLDIRECT 117.0 03/31/2021   TRIG 202.0 (H) 03/31/2021   CHOLHDL 4 03/31/2021   Menstrual cycles- She continues having menstrual cycles. She reports  her recent cycles for the past couple of months have been coming on more frequently.   Anemia- She has a history of anemia. She has given a stool sample in the remote past to check for blood.  Lab Results  Component Value Date   IRON 40 (L) 10/10/2020   TIBC 295 10/10/2020   FERRITIN 63 10/10/2020   Vision- She has seen her eye doctor earlier this year.    Health Maintenance Due  Topic Date Due  . COVID-19 Vaccine (1) Never done  . OPHTHALMOLOGY EXAM  12/08/2015  . FOOT EXAM  12/21/2020  . INFLUENZA VACCINE  Never done  . URINE MICROALBUMIN  03/31/2022    Past Medical History:  Diagnosis Date  . Allergy    seasonal allergies  . Anemia    on meds  . Blood transfusion without reported diagnosis 2008  . Cellulitis and abscess of foot 03/18/2020   right foot  . Diabetes mellitus without complication ([place])    on meds  . 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 Heart attack ([place]) 05/04/15  . History of DVT (deep vein thrombosis)    following hospitalization 2020  . History of kidney stones   . Menorrhagia with irregular cycle 10/11/2020  . Renal disorder   . Sepsis due to cellulitis ([place]) 05/15/2021  . Vitamin D deficiency 09/14/2014    Past Surgical History:  Procedure Laterality Date  . ABDOMINAL AORTOGRAM W/LOWER EXTREMITY N/A 03/01/2022  Procedure: ABDOMINAL AORTOGRAM W/LOWER EXTREMITY;  Surgeon: Marty Heck, MD;  Location: [facility];  Service: Cardiovascular;  Laterality: N/A;  . AMPUTATION Right 03/02/2022   Procedure: AMPUTATION OF FOURTH AND FIFTH TOES, Irrigation and debridment;  Surgeon: Edrick Kins, DPM;  Location: [facility];  Service: Podiatry;  Laterality: Right;  . CARDIAC CATHETERIZATION  2016  . CHOLECYSTECTOMY  2001  . I & D EXTREMITY Right 03/05/2022   Procedure: IRRIGATION AND DEBRIDEMENT AND CLOSURE RIGHT FOOT;  Surgeon: Lorenda Peck, DPM;  Location: [facility];  Service: Podiatry;  Laterality: Right;  . WISDOM TOOTH EXTRACTION       Family History  Problem Relation Age of Onset  . Diabetes Father   . Hypertension Father   . Hyperlipidemia Father   . Liver cancer Maternal Grandfather   . Kidney disease Neg Hx   . Heart disease Neg Hx   . Colon polyps Neg Hx   . Colon cancer Neg Hx   . Stomach cancer Neg Hx   . Rectal cancer Neg Hx     Social History   Socioeconomic History  . Marital status: Single    Spouse name: Not on file  . Number of children: Not on file  . Years of education: Not on file  . Highest education level: Not on file  Occupational History  . Not on file  Tobacco Use  . Smoking status: Never  . Smokeless tobacco: Never  Vaping Use  . Vaping Use: Never used  Substance and Sexual Activity  . Alcohol use: Not Currently    Comment: 3 x per month  . Drug use: Not Currently    Frequency: 2.0 times per week    Types: Marijuana  . Sexual activity: Not Currently  Other Topics Concern  . 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 Accu-Chek Softclix Lancets lancets USE FOUR TIMES DAILY 300 each 0  . acetaminophen (TYLENOL) 325 MG tablet Take 325 mg by mouth daily as needed (pain).    . blood glucose meter kit and supplies KIT Dispense based on patient and insurance preference. Use up to four times daily as directed. E11.9 1 each 0  . Blood Glucose Monitoring Suppl (ACCU-CHEK GUIDE) w/Device KIT Use 4 times Daily 1 kit 0  . cephALEXin (KEFLEX) 250 MG capsule Take 1 capsule (250 mg total) by mouth 4 (four) times daily for 14 days. 56  capsule 0  . glucose blood test strip USE FOUR TIMES DAILY 300 each 0  . ibuprofen (ADVIL) 200 MG tablet Take 400 mg by mouth daily as needed (pain).    . insulin NPH Human (NOVOLIN N) 100 UNIT/ML injection Inject 0.2 mLs (20 Units total) into the skin 2 (two) times daily before a meal. 10 mL 1  . insulin regular (NOVOLIN R) 100 units/mL injection Inject 5-8 Units  into the skin 3 (three) times daily before meals. 10 mL 1  . Insulin Syringe-Needle U-100 30G  X 1/2" 0.5 ML MISC Use up to 4 times daily 300 each 3  . Multiple Vitamin (MULTIVITAMIN) tablet Take 1 tablet by mouth daily.    Marland Kitchen oxyCODONE-acetaminophen (PERCOCET/ROXICET) 5-325 MG tablet Take 1-2 tablets by mouth every 4 (four) hours as needed for severe pain. 30 tablet 0  . pravastatin (PRAVACHOL) 40 MG tablet Take 1 tablet (40 mg total) by mouth daily. 90 tablet 0   No facility-administered medications prior to visit.    Allergies  Allergen Reactions  . Latex Itching    Tears skin  . Tape Hives    ROS    See HPI Objective:    Physical Exam Constitutional:      General: She is not in acute distress.    Appearance: Normal appearance. She is well-developed.  HENT:     Head: Normocephalic and atraumatic.     Right Ear: External ear normal.     Left Ear: External ear normal.  Eyes:     General: No scleral icterus. Neck:     Thyroid: No thyromegaly.  Cardiovascular:     Rate and Rhythm: Normal rate and regular rhythm.     Heart sounds: Normal heart sounds. No murmur heard. Pulmonary:     Effort: Pulmonary effort is normal. No respiratory distress.     Breath sounds: Normal breath sounds. No wheezing.  Musculoskeletal:     Cervical back: Neck supple.     Comments: Right foot is wrapped in gauze and in a walking shoe  Skin:    General: Skin is warm and dry.  Neurological:     Mental Status: She is alert and oriented to person, place, and time.  Psychiatric:        Mood and Affect: Mood normal.        Behavior:  Behavior normal.        Thought Content: Thought content normal.        Judgment: Judgment normal.    BP 93/68 (BP Location: Right Arm, Patient Position: Sitting, Cuff Size: Small)   Pulse 91   Temp 98.1 F (36.7 C) (Oral)   Resp 16   Wt 198 lb (89.8 kg)   LMP 02/24/2022 (Exact Date)   SpO2 100%   BMI 32.45 kg/m  Wt Readings from Last 3 Encounters:  03/19/22 198 lb (89.8 kg)  02/26/22 203 lb 0.7 oz (92.1 kg)  02/12/22 200 lb (90.7 kg)       Assessment & Plan:   Problem List Items Addressed This Visit       Unprioritized   Vitamin D deficiency   Relevant Orders   VITAMIN D 25 Hydroxy (Vit-D Deficiency, Fractures)   Uncontrolled type 1 diabetes mellitus with hyperglycemia ([place]) - Primary    Lab Results  Component Value Date   HGBA1C 13.1 (H) 02/26/2022   HGBA1C 12.6 (H) 03/31/2021   HGBA1C 10.7 (H) 08/31/2020   Lab Results  Component Value Date   MICROALBUR 13.1 (H) 03/31/2021   LDLCALC 93 01/29/2020   CREATININE 1.07 (H) 03/06/2022  Using Novolin N 20 units bid. Novolin R sliding scale 5-10 units.    Sugars in the 200's occasional 300.  She hopes to get back on insurance after 07/09/22 and would like to follow up with endocrinology to discuss an insulin pump.  I have placed this referral. In the meantime, encouraged improved compliance with above insulin regimen.       Relevant Orders   Ambulatory referral to Endocrinology   Basic metabolic panel  Iron deficiency anemia due to chronic blood loss    Will obtain iron studies and IFOB.       Hyperlipidemia    Lab Results  Component Value Date   CHOL 224 (H) 03/31/2021   HDL 60.10 03/31/2021   LDLCALC 93 01/29/2020   LDLDIRECT 117.0 03/31/2021   TRIG 202.0 (H) 03/31/2021   CHOLHDL 4 03/31/2021  Not taking pravastin.  Advised her to restart.       Gastroparesis    Overall stable/improved following discontinuation of marijuana use. Monitor.       Diabetic foot infection ([place])    S/p debridement/removal  of right 4th and 5th toes. Has follow up today with podiatry for wound evaluation.       Acute-on-chronic kidney injury [facility])    Lab Results  Component Value Date   CREATININE 1.07 (H) 03/06/2022  Will repeat bmet today.       Other Visit Diagnoses     Anemia, unspecified type       Relevant Orders   Iron, TIBC and Ferritin Panel   CBC with Differential/Platelet   Fecal occult blood, imunochemical([facility])       No orders of the defined types were placed in this encounter.   I, Nance Pear, NP, personally preformed the services described in this documentation.  All medical record entries made by the scribe were at my direction and in my presence.  I have reviewed the chart and discharge instructions (if applicable) and agree that the record reflects my personal performance and is accurate and complete.     Nance Pear, NP

## 2022-03-19 NOTE — Assessment & Plan Note (Addendum)
Lab Results  Component Value Date   HGBA1C 13.1 (H) 02/26/2022   HGBA1C 12.6 (H) 03/31/2021   HGBA1C 10.7 (H) 08/31/2020   Lab Results  Component Value Date   MICROALBUR 13.1 (H) 03/31/2021   LDLCALC 93 01/29/2020   CREATININE 1.07 (H) 03/06/2022   Using Novolin N 20 units bid. Novolin R sliding scale 5-10 units.    Sugars in the 200's occasional 300.  She hopes to get back on insurance after 07/09/22 and would like to follow up with endocrinology to discuss an insulin pump.  I have placed this referral. In the meantime, encouraged improved compliance with above insulin regimen.

## 2022-03-19 NOTE — Assessment & Plan Note (Signed)
Lab Results  Component Value Date   CREATININE 1.07 (H) 03/06/2022   Will repeat bmet today.

## 2022-03-19 NOTE — Assessment & Plan Note (Signed)
Will obtain iron studies and IFOB.

## 2022-03-20 ENCOUNTER — Telehealth: Payer: Self-pay | Admitting: Family

## 2022-03-20 DIAGNOSIS — E559 Vitamin D deficiency, unspecified: Secondary | ICD-10-CM

## 2022-03-20 LAB — IRON,TIBC AND FERRITIN PANEL
%SAT: 14 % (calc) — ABNORMAL LOW (ref 16–45)
Ferritin: 144 ng/mL (ref 16–232)
Iron: 41 ug/dL (ref 40–190)
TIBC: 296 mcg/dL (calc) (ref 250–450)

## 2022-03-20 MED ORDER — VITAMIN D (ERGOCALCIFEROL) 1.25 MG (50000 UNIT) PO CAPS: 50000.0000 [IU] | ORAL_CAPSULE | ORAL | 0 refills | Status: DC

## 2022-03-20 MED ORDER — IRON (FERROUS SULFATE) 325 (65 FE) MG PO TABS: 325.0000 mg | ORAL_TABLET | ORAL | Status: DC

## 2022-03-20 NOTE — Telephone Encounter (Signed)
Vitamin D level is low.  Advise patient to begin vit D 50000 units once weekly for 12 weeks, then repeat vit D level (dx Vit D deficiency).    Iron level is also low. Please add iron 325mg  one tab by mouth every other day otc.

## 2022-03-23 NOTE — Telephone Encounter (Signed)
Called but no answer and mailbox was full 

## 2022-03-23 NOTE — Telephone Encounter (Signed)
Patient advised of results, new prescription, otc iron recommendations. She will return in about a month for her scheduled follow up

## 2022-04-01 LAB — FUNGUS CULTURE WITH STAIN

## 2022-04-01 LAB — FUNGUS CULTURE RESULT

## 2022-04-01 LAB — FUNGAL ORGANISM REFLEX

## 2022-04-02 ENCOUNTER — Ambulatory Visit (INDEPENDENT_AMBULATORY_CARE_PROVIDER_SITE_OTHER): Payer: Self-pay | Admitting: Podiatry

## 2022-04-02 DIAGNOSIS — Z9889 Other specified postprocedural states: Secondary | ICD-10-CM

## 2022-04-02 MED ORDER — CEPHALEXIN 500 MG PO CAPS: 500.0000 mg | ORAL_CAPSULE | Freq: Three times a day (TID) | ORAL | 0 refills | Status: DC

## 2022-04-02 NOTE — Progress Notes (Signed)
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 DOS: 03/03/2022.  She underwent subsequent irrigation and debridement with delayed primary closure on 03/05/2022.  Patient states that she continues to do well.  She has been weightbearing in the surgical shoe as instructed.  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   Heart attack ([place]) 05/04/15   History of DVT (deep vein thrombosis)    following hospitalization 2020   History of kidney stones    Menorrhagia with irregular cycle 10/11/2020   Renal disorder    Sepsis due to cellulitis ([place]) 05/15/2021   Vitamin D deficiency 09/14/2014    Past Surgical History:  Procedure Laterality Date   ABDOMINAL AORTOGRAM W/LOWER EXTREMITY N/A 03/01/2022   Procedure: ABDOMINAL AORTOGRAM W/LOWER EXTREMITY;  Surgeon: Marty Heck, MD;  Location: [place] [facility];  Service: Cardiovascular;  Laterality: N/A;   AMPUTATION Right 03/02/2022   Procedure: AMPUTATION OF FOURTH AND FIFTH TOES, Irrigation and debridment;  Surgeon: Edrick Kins, DPM;  Location: [place];  Service: Podiatry;  Laterality: Right;   CARDIAC CATHETERIZATION  2016   CHOLECYSTECTOMY  2001   I & D EXTREMITY Right 03/05/2022   Procedure: IRRIGATION AND DEBRIDEMENT AND CLOSURE RIGHT FOOT;  Surgeon: Lorenda Peck, DPM;  Location: [place];  Service: Podiatry;  Laterality: Right;   WISDOM TOOTH EXTRACTION      Allergies  Allergen Reactions   Latex Itching    Tears skin   Tape Hives         Objective/Physical Exam Neurovascular status intact.  Skin incisions appear to be well coapted with sutures  intact. No sign of infectious process noted. No dehiscence. No active bleeding noted.  Negative for any significant edema to the foot  Radiographic Exam RT foot 03/19/2022:  Patient of the fourth and fifth digits noted right foot with clean osteotomies of the metatarsal heads fourth and fifth.  Assessment: 1. 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 DOS: 03/03/2022   Plan of Care:  1. Patient was evaluated.  2.  Sutures removed today 3.  Overall the foot appears to be doing well however it is slightly warmer compared to the contralateral limb.  I will go ahead and call them a refill of the Keflex 500 mg 3 times daily x7 days 4.  Patient may begin to transition out of the postsurgical shoe back into good supportive sneakers 5.  Return to clinic 4 weeks for follow-up x-ray   Edrick Kins, DPM [facility]  Dr. Edrick Kins, DPM    [address] [place] [address]                Office [phone]  Fax [phone]

## 2022-04-18 ENCOUNTER — Ambulatory Visit: Payer: Self-pay | Admitting: Family

## 2022-04-30 ENCOUNTER — Encounter: Payer: Self-pay | Admitting: Podiatry

## 2022-10-09 ENCOUNTER — Other Ambulatory Visit: Payer: Self-pay

## 2022-10-09 ENCOUNTER — Emergency Department (HOSPITAL_BASED_OUTPATIENT_CLINIC_OR_DEPARTMENT_OTHER): Payer: Self-pay

## 2022-10-09 ENCOUNTER — Inpatient Hospital Stay (HOSPITAL_BASED_OUTPATIENT_CLINIC_OR_DEPARTMENT_OTHER)
Admission: EM | Admit: 2022-10-09 | Discharge: 2022-10-13 | DRG: 617 | Disposition: A | Discharge status: Home / Self Care | Payer: Self-pay | Attending: Internal Medicine | Admitting: Internal Medicine

## 2022-10-09 ENCOUNTER — Encounter (HOSPITAL_BASED_OUTPATIENT_CLINIC_OR_DEPARTMENT_OTHER): Payer: Self-pay | Admitting: Emergency Medicine

## 2022-10-09 DIAGNOSIS — K3184 Gastroparesis: Secondary | ICD-10-CM | POA: Diagnosis present

## 2022-10-09 DIAGNOSIS — M869 Osteomyelitis, unspecified: Principal | ICD-10-CM

## 2022-10-09 DIAGNOSIS — I5032 Chronic diastolic (congestive) heart failure: Secondary | ICD-10-CM | POA: Diagnosis present

## 2022-10-09 DIAGNOSIS — L03032 Cellulitis of left toe: Secondary | ICD-10-CM | POA: Diagnosis present

## 2022-10-09 DIAGNOSIS — D631 Anemia in chronic kidney disease: Secondary | ICD-10-CM | POA: Diagnosis present

## 2022-10-09 DIAGNOSIS — I110 Hypertensive heart disease with heart failure: Secondary | ICD-10-CM | POA: Diagnosis present

## 2022-10-09 DIAGNOSIS — Z794 Long term (current) use of insulin: Secondary | ICD-10-CM

## 2022-10-09 DIAGNOSIS — E10621 Type 1 diabetes mellitus with foot ulcer: Secondary | ICD-10-CM | POA: Diagnosis present

## 2022-10-09 DIAGNOSIS — E1065 Type 1 diabetes mellitus with hyperglycemia: Secondary | ICD-10-CM | POA: Diagnosis present

## 2022-10-09 DIAGNOSIS — M86172 Other acute osteomyelitis, left ankle and foot: Secondary | ICD-10-CM | POA: Diagnosis present

## 2022-10-09 DIAGNOSIS — Z6836 Body mass index (BMI) 36.0-36.9, adult: Secondary | ICD-10-CM

## 2022-10-09 DIAGNOSIS — E1069 Type 1 diabetes mellitus with other specified complication: Principal | ICD-10-CM | POA: Diagnosis present

## 2022-10-09 DIAGNOSIS — Z91040 Latex allergy status: Secondary | ICD-10-CM

## 2022-10-09 DIAGNOSIS — Z8249 Family history of ischemic heart disease and other diseases of the circulatory system: Secondary | ICD-10-CM

## 2022-10-09 DIAGNOSIS — E1043 Type 1 diabetes mellitus with diabetic autonomic (poly)neuropathy: Secondary | ICD-10-CM | POA: Diagnosis present

## 2022-10-09 DIAGNOSIS — I2510 Atherosclerotic heart disease of native coronary artery without angina pectoris: Secondary | ICD-10-CM | POA: Diagnosis present

## 2022-10-09 DIAGNOSIS — Z833 Family history of diabetes mellitus: Secondary | ICD-10-CM

## 2022-10-09 DIAGNOSIS — E669 Obesity, unspecified: Secondary | ICD-10-CM | POA: Diagnosis present

## 2022-10-09 DIAGNOSIS — I70262 Atherosclerosis of native arteries of extremities with gangrene, left leg: Secondary | ICD-10-CM | POA: Diagnosis present

## 2022-10-09 DIAGNOSIS — L97529 Non-pressure chronic ulcer of other part of left foot with unspecified severity: Secondary | ICD-10-CM | POA: Diagnosis present

## 2022-10-09 DIAGNOSIS — Z87442 Personal history of urinary calculi: Secondary | ICD-10-CM

## 2022-10-09 DIAGNOSIS — Z79899 Other long term (current) drug therapy: Secondary | ICD-10-CM

## 2022-10-09 DIAGNOSIS — Z1629 Resistance to other single specified antibiotic: Secondary | ICD-10-CM | POA: Diagnosis present

## 2022-10-09 DIAGNOSIS — Z9049 Acquired absence of other specified parts of digestive tract: Secondary | ICD-10-CM

## 2022-10-09 DIAGNOSIS — Z888 Allergy status to other drugs, medicaments and biological substances status: Secondary | ICD-10-CM

## 2022-10-09 DIAGNOSIS — B957 Other staphylococcus as the cause of diseases classified elsewhere: Secondary | ICD-10-CM | POA: Diagnosis present

## 2022-10-09 DIAGNOSIS — I252 Old myocardial infarction: Secondary | ICD-10-CM

## 2022-10-09 DIAGNOSIS — Z86718 Personal history of other venous thrombosis and embolism: Secondary | ICD-10-CM

## 2022-10-09 DIAGNOSIS — I509 Heart failure, unspecified: Secondary | ICD-10-CM

## 2022-10-09 DIAGNOSIS — Z800 Family history of malignant neoplasm of digestive organs: Secondary | ICD-10-CM

## 2022-10-09 DIAGNOSIS — L089 Local infection of the skin and subcutaneous tissue, unspecified: Secondary | ICD-10-CM

## 2022-10-09 DIAGNOSIS — E785 Hyperlipidemia, unspecified: Secondary | ICD-10-CM | POA: Diagnosis present

## 2022-10-09 DIAGNOSIS — I10 Essential (primary) hypertension: Secondary | ICD-10-CM | POA: Diagnosis present

## 2022-10-09 DIAGNOSIS — I25119 Atherosclerotic heart disease of native coronary artery with unspecified angina pectoris: Secondary | ICD-10-CM

## 2022-10-09 DIAGNOSIS — E1052 Type 1 diabetes mellitus with diabetic peripheral angiopathy with gangrene: Secondary | ICD-10-CM | POA: Diagnosis present

## 2022-10-09 DIAGNOSIS — E11628 Type 2 diabetes mellitus with other skin complications: Secondary | ICD-10-CM | POA: Diagnosis present

## 2022-10-09 LAB — URINALYSIS, MICROSCOPIC (REFLEX)

## 2022-10-09 LAB — URINALYSIS, ROUTINE W REFLEX MICROSCOPIC
Bilirubin Urine: NEGATIVE
Glucose, UA: >=500 mg/dL — AB
Ketones, ur: 15 mg/dL — AB
Leukocytes,Ua: NEGATIVE
Nitrite: NEGATIVE
Protein, ur: 100 mg/dL — AB
Specific Gravity, Urine: 1.025 (ref 1.005–1.030)
pH: 6 (ref 5.0–8.0)

## 2022-10-09 LAB — BASIC METABOLIC PANEL
Anion gap: 10 (ref 5–15)
BUN: 28 mg/dL — ABNORMAL HIGH (ref 6–20)
CO2: 22 mmol/L (ref 22–32)
Calcium: 8.5 mg/dL — ABNORMAL LOW (ref 8.9–10.3)
Chloride: 99 mmol/L (ref 98–111)
Creatinine, Ser: 1.31 mg/dL — ABNORMAL HIGH (ref 0.44–1.00)
GFR, Estimated: 51 mL/min — ABNORMAL LOW (ref >60)
Glucose, Bld: 430 mg/dL — ABNORMAL HIGH (ref 70–99)
Potassium: 4 mmol/L (ref 3.5–5.1)
Sodium: 131 mmol/L — ABNORMAL LOW (ref 135–145)

## 2022-10-09 LAB — CBC WITH DIFFERENTIAL/PLATELET
Abs Immature Granulocytes: 0.02 10*3/uL (ref 0.00–0.07)
Basophils Absolute: 0 10*3/uL (ref 0.0–0.1)
Basophils Relative: 0 %
Eosinophils Absolute: 0.1 10*3/uL (ref 0.0–0.5)
Eosinophils Relative: 1 %
HCT: 28.7 % — ABNORMAL LOW (ref 36.0–46.0)
Hemoglobin: 9.2 g/dL — ABNORMAL LOW (ref 12.0–15.0)
Immature Granulocytes: 0 %
Lymphocytes Relative: 15 %
Lymphs Abs: 1.1 10*3/uL (ref 0.7–4.0)
MCH: 28.3 pg (ref 26.0–34.0)
MCHC: 32.1 g/dL (ref 30.0–36.0)
MCV: 88.3 fL (ref 80.0–100.0)
Monocytes Absolute: 0.4 10*3/uL (ref 0.1–1.0)
Monocytes Relative: 5 %
Neutro Abs: 6 10*3/uL (ref 1.7–7.7)
Neutrophils Relative %: 79 %
Platelets: 259 10*3/uL (ref 150–400)
RBC: 3.25 MIL/uL — ABNORMAL LOW (ref 3.87–5.11)
RDW: 13.6 % (ref 11.5–15.5)
WBC: 7.6 10*3/uL (ref 4.0–10.5)
nRBC: 0 % (ref 0.0–0.2)

## 2022-10-09 LAB — LACTIC ACID, PLASMA
Lactic Acid, Venous: 1.3 mmol/L (ref 0.5–1.9)
Lactic Acid, Venous: 1.5 mmol/L (ref 0.5–1.9)

## 2022-10-09 LAB — HIV ANTIBODY (ROUTINE TESTING W REFLEX): HIV Screen 4th Generation wRfx: NONREACTIVE

## 2022-10-09 LAB — GLUCOSE, CAPILLARY
Glucose-Capillary: 190 mg/dL — ABNORMAL HIGH (ref 70–99)
Glucose-Capillary: 366 mg/dL — ABNORMAL HIGH (ref 70–99)

## 2022-10-09 LAB — PREGNANCY, URINE: Preg Test, Ur: NEGATIVE

## 2022-10-09 LAB — BETA-HYDROXYBUTYRIC ACID: Beta-Hydroxybutyric Acid: 0.67 mmol/L — ABNORMAL HIGH (ref 0.05–0.27)

## 2022-10-09 LAB — CBG MONITORING, ED: Glucose-Capillary: 332 mg/dL — ABNORMAL HIGH (ref 70–99)

## 2022-10-09 MED ORDER — INSULIN ASPART 100 UNIT/ML IJ SOLN
0.0000 [IU] | Freq: Three times a day (TID) | INTRAMUSCULAR | Status: DC
  Administered 2022-10-10: 3 [IU] via SUBCUTANEOUS
  Administered 2022-10-10: 5 [IU] via SUBCUTANEOUS
  Administered 2022-10-11: 15 [IU] via SUBCUTANEOUS

## 2022-10-09 MED ORDER — METRONIDAZOLE 500 MG PO TABS
500.0000 mg | ORAL_TABLET | Freq: Two times a day (BID) | ORAL | Status: DC
  Administered 2022-10-09 – 2022-10-12 (×7): 500 mg via ORAL
  Filled 2022-10-09 (×8): qty 1

## 2022-10-09 MED ORDER — VANCOMYCIN HCL IN DEXTROSE 1-5 GM/200ML-% IV SOLN: 1000.0000 mg | Freq: Once | INTRAVENOUS | Status: DC

## 2022-10-09 MED ORDER — HYDROCODONE-ACETAMINOPHEN 5-325 MG PO TABS: 1.0000 | ORAL_TABLET | Freq: Four times a day (QID) | ORAL | Status: DC | PRN

## 2022-10-09 MED ORDER — VANCOMYCIN HCL IN DEXTROSE 1-5 GM/200ML-% IV SOLN
1000.0000 mg | Freq: Once | INTRAVENOUS | Status: AC
  Administered 2022-10-09: 1000 mg via INTRAVENOUS
  Filled 2022-10-09: qty 200

## 2022-10-09 MED ORDER — DIPHENHYDRAMINE HCL 50 MG/ML IJ SOLN
25.0000 mg | Freq: Once | INTRAMUSCULAR | Status: AC
  Administered 2022-10-09: 25 mg via INTRAVENOUS
  Filled 2022-10-09: qty 1

## 2022-10-09 MED ORDER — PRAVASTATIN SODIUM 40 MG PO TABS
40.0000 mg | ORAL_TABLET | Freq: Every day | ORAL | Status: DC
  Administered 2022-10-10 – 2022-10-13 (×2): 40 mg via ORAL
  Filled 2022-10-09 (×4): qty 1

## 2022-10-09 MED ORDER — METRONIDAZOLE 500 MG PO TABS
500.0000 mg | ORAL_TABLET | Freq: Once | ORAL | Status: AC
  Administered 2022-10-09: 500 mg via ORAL
  Filled 2022-10-09: qty 1

## 2022-10-09 MED ORDER — VANCOMYCIN HCL 500 MG IV SOLR: 500.0000 mg | Freq: Once | INTRAVENOUS | Status: DC

## 2022-10-09 MED ORDER — SODIUM CHLORIDE 0.9 % IV SOLN
2.0000 g | Freq: Once | INTRAVENOUS | Status: AC
  Administered 2022-10-09: 2 g via INTRAVENOUS
  Filled 2022-10-09: qty 12.5

## 2022-10-09 MED ORDER — INSULIN ASPART 100 UNIT/ML IJ SOLN
0.0000 [IU] | Freq: Every day | INTRAMUSCULAR | Status: DC
  Administered 2022-10-09: 5 [IU] via SUBCUTANEOUS

## 2022-10-09 MED ORDER — VANCOMYCIN HCL 1250 MG/250ML IV SOLN
1250.0000 mg | INTRAVENOUS | Status: DC
  Administered 2022-10-10 – 2022-10-12 (×3): 1250 mg via INTRAVENOUS
  Filled 2022-10-09 (×5): qty 250

## 2022-10-09 MED ORDER — POLYETHYLENE GLYCOL 3350 17 G PO PACK: 17.0000 g | PACK | Freq: Every day | ORAL | Status: DC | PRN

## 2022-10-09 MED ORDER — ACETAMINOPHEN 650 MG RE SUPP: 650.0000 mg | Freq: Four times a day (QID) | RECTAL | Status: DC | PRN

## 2022-10-09 MED ORDER — DIPHENHYDRAMINE HCL 50 MG/ML IJ SOLN: 50.0000 mg | Freq: Three times a day (TID) | INTRAMUSCULAR | Status: DC | PRN

## 2022-10-09 MED ORDER — ACETAMINOPHEN 325 MG PO TABS: 650.0000 mg | ORAL_TABLET | Freq: Four times a day (QID) | ORAL | Status: DC | PRN

## 2022-10-09 MED ORDER — SODIUM CHLORIDE 0.9% FLUSH
3.0000 mL | Freq: Two times a day (BID) | INTRAVENOUS | Status: DC
  Administered 2022-10-09 – 2022-10-12 (×5): 3 mL via INTRAVENOUS

## 2022-10-09 MED ORDER — SODIUM CHLORIDE 0.9 % IV SOLN
2.0000 g | Freq: Three times a day (TID) | INTRAVENOUS | Status: DC
  Administered 2022-10-09 – 2022-10-13 (×11): 2 g via INTRAVENOUS
  Filled 2022-10-09 (×10): qty 12.5

## 2022-10-09 NOTE — ED Notes (Signed)
Report given to [place] with CL.

## 2022-10-09 NOTE — ED Triage Notes (Signed)
Possible left fifth toe infection , reports pain an swelling , pain radiates to ankle

## 2022-10-09 NOTE — Progress Notes (Signed)
Patient tolerated 2000 dose of cefepime with no itching or other complications.

## 2022-10-09 NOTE — ED Notes (Signed)
Called [facility] for transport to [facility] @2 :20pm.  Spoke with [place]

## 2022-10-09 NOTE — Progress Notes (Signed)
Pharmacy Antibiotic Note  Abigail Moran is a 47 y.o. female admitted on 10/09/2022 with  wound infection .  Pharmacy has been consulted for vancomycin and cefepime dosing. Pt with Tmax 99 and WBC is WNL. Scr is slightly above baseline at 1.31. Lactic acid is <2.   Plan: Vancomycin 2g IV x 1 then 1250mg  IV Q24H  Cefepime 2g IV Q8H F/u renal fxn, C&S, clinical status and peak/trough at SS  Weight: 102.1 kg (225 lb)  Temp (24hrs), Avg:99 F (37.2 C), Min:99 F (37.2 C), Max:99 F (37.2 C)  Recent Labs  Lab 10/09/22 1100  WBC 7.6  CREATININE 1.31*  LATICACIDVEN 1.3    Estimated Creatinine Clearance: 63.5 mL/min (A) (by C-G formula based on SCr of 1.31 mg/dL (H)).    Allergies  Allergen Reactions   Latex Itching    Tears skin   Tape Hives    Antimicrobials this admission: Vanc 4/2>> Cefepime 4/2>> Flagyl x 1 4/2  Dose adjustments this admission: N/A  Microbiology results: Pending  Thank you for allowing pharmacy to be a part of this patient's care.  Josias Tomerlin, Rande Lawman 10/09/2022 11:26 AM

## 2022-10-09 NOTE — H&P (Signed)
History and Physical   Abigail Moran [id number] DOB: 10-20-1975 DOA: 10/09/2022  PCP: [name], NP   Patient coming from: Home  Chief Complaint: Toe pain  HPI: Abigail Moran is a 47 y.o. female with medical history significant of diabetes, hypertension, hyperlipidemia, CHF, CAD, gastroparesis, menorrhagia presenting with toe pain.  Patient presenting with left toe pain for the past 2 days.  It is specifically at her left fifth toe which has been painful, itchy, black with pink drainage.  Reports that the pain radiates up to her ankle.  She reports he had similar issue in the past and was admitted to the hospital for.  She denies fevers, chills, chest pain, shortness of breath, abdominal pain, constipation, diarrhea, nausea, vomiting.  ED Course: Vital signs in the ED stable.  Lab workup included CMP with sodium 131, BUN 28, creatinine stable 1.31, glucose 430, calcium 8.5.  CBC with hemoglobin stable at 9.2.  Lactic acid normal with repeat pending.  Urinalysis with glucose, ketones, protein, rare bacteria.  Blood cultures pending.  Beta hydroxybutyric acid mildly elevated at 0.67.  Left foot and left ankle x-ray showed soft tissue swelling and inability to rule out osteomyelitis of the left fifth distal phalanx.  Patient started on vancomycin, cefepime, Flagyl in the ED.  Did have to receive a dose of Benadryl for itching that occurred after receiving cefepime.  Initial excepting provider notified patient's podiatry team who requested MRI and ABI for evaluation for need for possible surgery tomorrow.  Review of Systems: As per HPI otherwise all other systems reviewed and are negative.  Past Medical History:  Diagnosis Date   Allergy    seasonal allergies   Anemia    on meds   Blood transfusion without reported diagnosis 2008   Cellulitis and abscess of foot 03/18/2020   right foot   Diabetes mellitus without complication    on meds   Erythropoietin deficiency anemia  10/11/2020   Heart attack 05/04/15   History of DVT (deep vein thrombosis)    following hospitalization 2020   History of kidney stones    Menorrhagia with irregular cycle 10/11/2020   Renal disorder    Sepsis due to cellulitis 05/15/2021   Vitamin D deficiency 09/14/2014    Past Surgical History:  Procedure Laterality Date   ABDOMINAL AORTOGRAM W/LOWER EXTREMITY N/A 03/01/2022   Procedure: ABDOMINAL AORTOGRAM W/LOWER EXTREMITY;  Surgeon: Marty Heck, MD;  Location: [place] [facility];  Service: Cardiovascular;  Laterality: N/A;   AMPUTATION Right 03/02/2022   Procedure: AMPUTATION OF FOURTH AND FIFTH TOES, Irrigation and debridment;  Surgeon: Edrick Kins, DPM;  Location: [place];  Service: Podiatry;  Laterality: Right;   CARDIAC CATHETERIZATION  2016   CHOLECYSTECTOMY  2001   I & D EXTREMITY Right 03/05/2022   Procedure: IRRIGATION AND DEBRIDEMENT AND CLOSURE RIGHT FOOT;  Surgeon: Lorenda Peck, DPM;  Location: [place];  Service: Podiatry;  Laterality: Right;   WISDOM TOOTH EXTRACTION      Social History  reports that she has never smoked. She has never used smokeless tobacco. She reports that she does not currently use alcohol. She reports that she does not currently use drugs after having used the following drugs: Marijuana. Frequency: 2.00 times per week.  Allergies  Allergen Reactions   Latex Itching    Tears skin   Tape Hives    Family History  Problem Relation Age of Onset   Diabetes Father    Hypertension Father    Hyperlipidemia  Father    Liver cancer Maternal Grandfather    Kidney disease Neg Hx    Heart disease Neg Hx    Colon polyps Neg Hx    Colon cancer Neg Hx    Stomach cancer Neg Hx    Rectal cancer Neg Hx   Reviewed on admission  Prior to Admission medications   Medication Sig Start Date End Date Taking? Authorizing Provider  Accu-Chek Softclix Lancets lancets USE FOUR TIMES DAILY 03/06/22   Little Ishikawa, MD  acetaminophen (TYLENOL) 325 MG  tablet Take 325 mg by mouth daily as needed (pain).    [provider]  blood glucose meter kit and supplies KIT Dispense based on patient and insurance preference. Use up to four times daily as directed. E11.9 03/06/22   Little Ishikawa, MD  Blood Glucose Monitoring Suppl (ACCU-CHEK GUIDE) w/Device KIT Use 4 times Daily 03/06/22   Little Ishikawa, MD  cephALEXin (KEFLEX) 500 MG capsule Take 1 capsule (500 mg total) by mouth 3 (three) times daily. 04/02/22   Edrick Kins, DPM  glucose blood test strip USE FOUR TIMES DAILY 03/06/22   Little Ishikawa, MD  ibuprofen (ADVIL) 200 MG tablet Take 400 mg by mouth daily as needed (pain).    [provider]  insulin NPH Human (NOVOLIN N) 100 UNIT/ML injection Inject 0.2 mLs (20 Units total) into the skin 2 (two) times daily before a meal. 03/06/22   Little Ishikawa, MD  insulin regular (NOVOLIN R) 100 units/mL injection Inject 5-8 Units  into the skin 3 (three) times daily before meals. 03/06/22   Little Ishikawa, MD  Insulin Syringe-Needle U-100 30G X 1/2" 0.5 ML MISC Use up to 4 times daily 03/06/22   Little Ishikawa, MD  Iron, Ferrous Sulfate, 325 (65 Fe) MG TABS Take 325 mg by mouth every other day. 03/20/22   [name], NP  Multiple Vitamin (MULTIVITAMIN) tablet Take 1 tablet by mouth daily.    [provider]  oxyCODONE-acetaminophen (PERCOCET/ROXICET) 5-325 MG tablet Take 1-2 tablets by mouth every 4 (four) hours as needed for severe pain. 03/06/22   Little Ishikawa, MD  pravastatin (PRAVACHOL) 40 MG tablet Take 1 tablet (40 mg total) by mouth daily. 03/06/22   Little Ishikawa, MD  Vitamin D, Ergocalciferol, (DRISDOL) 1.25 MG (50000 UNIT) CAPS capsule Take 1 capsule (50,000 Units total) by mouth every 7 (seven) days. 03/20/22   [name], NP    Physical Exam: Vitals:   10/09/22 1330 10/09/22 1400 10/09/22 1409 10/09/22 1715  BP: 118/82 111/73  (!) 137/97  Pulse: 93 88  92   Resp:    18  Temp:   98.8 F (37.1 C) 98.3 F (36.8 C)  TempSrc:   Oral Oral  SpO2: 96% 97%  100%  Weight:    101 kg  Height:    5' 5.5" (1.664 m)    Physical Exam Constitutional:      General: She is not in acute distress.    Appearance: Normal appearance.  HENT:     Head: Normocephalic and atraumatic.     Mouth/Throat:     Mouth: Mucous membranes are moist.     Pharynx: Oropharynx is clear.  Eyes:     Extraocular Movements: Extraocular movements intact.     Pupils: Pupils are equal, round, and reactive to light.  Cardiovascular:     Rate and Rhythm: Normal rate and regular rhythm.     Pulses: Normal pulses.  Heart sounds: Normal heart sounds.  Pulmonary:     Effort: Pulmonary effort is normal. No respiratory distress.     Breath sounds: Normal breath sounds.  Abdominal:     General: Bowel sounds are normal. There is no distension.     Palpations: Abdomen is soft.     Tenderness: There is no abdominal tenderness.  Musculoskeletal:        General: No swelling or deformity.     Comments: Let fifth digit with black discoloration and evidence of prior drainage.  Discoloration and skin changes to multiple toes.  Skin:    General: Skin is warm and dry.  Neurological:     General: No focal deficit present.     Mental Status: Mental status is at baseline.        Labs on Admission: I have personally reviewed following labs and imaging studies  CBC: Recent Labs  Lab 10/09/22 1100  WBC 7.6  NEUTROABS 6.0  HGB 9.2*  HCT 28.7*  MCV 88.3  PLT [id number]    Basic Metabolic Panel: Recent Labs  Lab 10/09/22 1100  NA 131*  K 4.0  CL 99  CO2 22  GLUCOSE 430*  BUN 28*  CREATININE 1.31*  CALCIUM 8.5*    GFR: Estimated Creatinine Clearance: 63.1 mL/min (A) (by C-G formula based on SCr of 1.31 mg/dL (H)).  Liver Function Tests: No results for input(s): "AST", "ALT", "ALKPHOS", "BILITOT", "PROT", "ALBUMIN" in the last 168 hours.  Urine analysis:    Component  Value Date/Time   COLORURINE YELLOW 10/09/2022 1059   APPEARANCEUR CLEAR 10/09/2022 1059   LABSPEC 1.025 10/09/2022 1059   PHURINE 6.0 10/09/2022 1059   GLUCOSEU >=500 (A) 10/09/2022 1059   GLUCOSEU >=1000 (A) 08/08/2016 0813   HGBUR TRACE (A) 10/09/2022 1059   BILIRUBINUR NEGATIVE 10/09/2022 1059   KETONESUR 15 (A) 10/09/2022 1059   PROTEINUR 100 (A) 10/09/2022 1059   UROBILINOGEN 0.2 08/08/2016 0813   NITRITE NEGATIVE 10/09/2022 1059   LEUKOCYTESUR NEGATIVE 10/09/2022 1059    Radiological Exams on Admission: DG Foot Complete Left  Result Date: 10/09/2022 CLINICAL DATA:  left fifth toe pain/drainage EXAM: LEFT FOOT - COMPLETE 3 VIEW; LEFT ANKLE COMPLETE - 3 VIEW COMPARISON:  None Available. FINDINGS: There is no evidence of fracture, dislocation, or subluxation. There is no evidence of arthropathy or other focal bone abnormality. Soft tissue swelling noted at the ankle mortise. Fifth distal phalanx soft tissue swelling identified with suggestion of a defect consistent with a wound. Fifth distal phalanx is thin and sclerotic in the possibility of osteomyelitis cannot be excluded. IMPRESSION: Soft tissue swelling at the ankle mortise and fifth distal phalanx. Cannot rule out osteomyelitis of the fifth distal phalanx. Electronically Signed   By: Sammie Bench M.D.   On: 10/09/2022 10:47   DG Ankle Complete Left  Result Date: 10/09/2022 CLINICAL DATA:  left fifth toe pain/drainage EXAM: LEFT FOOT - COMPLETE 3 VIEW; LEFT ANKLE COMPLETE - 3 VIEW COMPARISON:  None Available. FINDINGS: There is no evidence of fracture, dislocation, or subluxation. There is no evidence of arthropathy or other focal bone abnormality. Soft tissue swelling noted at the ankle mortise. Fifth distal phalanx soft tissue swelling identified with suggestion of a defect consistent with a wound. Fifth distal phalanx is thin and sclerotic in the possibility of osteomyelitis cannot be excluded. IMPRESSION: Soft tissue swelling at  the ankle mortise and fifth distal phalanx. Cannot rule out osteomyelitis of the fifth distal phalanx. Electronically Signed   By:  Sammie Bench M.D.   On: 10/09/2022 10:47    EKG: Not performed in the emergency department.  Assessment/Plan Principal Problem:   Diabetic foot infection Active Problems:   Uncontrolled type 1 diabetes mellitus with hyperglycemia   CAD (coronary artery disease)   CHF (congestive heart failure)   Hyperlipidemia   Primary hypertension   Diabetic foot infection > Patient presenting with 2 days of toe pain now black with pink drainage.  Evidence of soft tissue infection and x-ray unable to rule out left distal phalanx osteomyelitis. > Podiatry consulted by accepting provider and requested MRI and ABI and they will plan to see the patient in the morning for possible procedure if indicated. > Started on vancomycin, cefepime, Flagyl in the ED.  Did have some significant itching after cefepime administration which improved with Benadryl.  She has tolerated cefepime several times in the past so we will continue it now if this becomes a significant/recurrent issue we will reassess antibiotic regimen. -Trend fever curve and WBC - Continue vancomycin, cefepime, Flagyl for now - appreciate podiatry recommendations and assistance with this patient MRI left foot - ABI - Supportive care  Diabetes > 20 units of twice daily at home and takes them with meals as well. - SSI - Will add long-acting if persistently elevated  Hyperlipidemia - Continue home pravastatin  Hypertension CHF CAD > Noted in history.  Last echo in chart was 2016 with EF 50-55%, no comment on diastolic function, normal RV function. - Not on any medications for these  DVT prophylaxis: SCDs Code Status:   Full Family Communication:  None on Admission Disposition Plan:   Patient is from:  Home  Anticipated DC to:  Home  Anticipated DC date:  2-3 days  Anticipated DC barriers: None  Consults  called:  Podiatry Admission status:  Inpatient, MedSurg  Severity of Illness: The appropriate patient status for this patient is INPATIENT. Inpatient status is judged to be reasonable and necessary in order to provide the required intensity of service to ensure the patient's safety. The patient's presenting symptoms, physical exam findings, and initial radiographic and laboratory data in the context of their chronic comorbidities is felt to place them at high risk for further clinical deterioration. Furthermore, it is not anticipated that the patient will be medically stable for discharge from the hospital within 2 midnights of admission.   * I certify that at the point of admission it is my clinical judgment that the patient will require inpatient hospital care spanning beyond 2 midnights from the point of admission due to high intensity of service, high risk for further deterioration and high frequency of surveillance required.Marcelyn Bruins MD [facility]  How to contact the [facility] Attending or Consulting provider [place] or covering provider during after hours [place], for this patient?   Check the care team in [facility] and look for a) attending/consulting [facility] provider listed and b) the [facility] team listed Log into [email or web address] and use [place]'s universal password to access. If you do not have the password, please contact the hospital operator. Locate the [facility] provider you are looking for under [facility] and page to a number that you can be directly reached. If you still have difficulty reaching the provider, please page the [facility] (Director on Call) for the Hospitalists listed on amion for assistance.  10/09/2022, 6:13 PM

## 2022-10-09 NOTE — ED Notes (Signed)
UTO second set of cultures due to difficult IV

## 2022-10-09 NOTE — ED Notes (Signed)
Maxipime Dc'd. Pt complaining of full body itching after IV initiated. EDP notified.

## 2022-10-09 NOTE — ED Provider Notes (Signed)
[facility] Provider Note   CSN: [id number] Arrival date & time: 10/09/22  0920     History  Chief Complaint  Patient presents with   Toe Pain    Left fifith    Xariah Volkert is a 47 y.o. female with a past medical history of diabetes who presents emergency department with concerns for left foot toe pain x 2 days.  Notes that the toe is itchy, black, with pink drainage noted to it.  Notes that her left fifth toe pain radiates to her ankle.  Has a history of similar symptoms where she was admitted to the hospital.  Notes that she has been compliant with her diabetic medications.  Denies fever, chest pain, shortness of breath, abdominal pain, nausea, vomiting.   The history is provided by the patient. No language interpreter was used.       Home Medications Prior to Admission medications   Medication Sig Start Date End Date Taking? Authorizing Provider  Accu-Chek Softclix Lancets lancets USE FOUR TIMES DAILY 03/06/22   Little Ishikawa, MD  acetaminophen (TYLENOL) 325 MG tablet Take 325 mg by mouth daily as needed (pain).    [provider]  blood glucose meter kit and supplies KIT Dispense based on patient and insurance preference. Use up to four times daily as directed. E11.9 03/06/22   Little Ishikawa, MD  Blood Glucose Monitoring Suppl (ACCU-CHEK GUIDE) w/Device KIT Use 4 times Daily 03/06/22   Little Ishikawa, MD  cephALEXin (KEFLEX) 500 MG capsule Take 1 capsule (500 mg total) by mouth 3 (three) times daily. 04/02/22   Edrick Kins, DPM  glucose blood test strip USE FOUR TIMES DAILY 03/06/22   Little Ishikawa, MD  ibuprofen (ADVIL) 200 MG tablet Take 400 mg by mouth daily as needed (pain).    [provider]  insulin NPH Human (NOVOLIN N) 100 UNIT/ML injection Inject 0.2 mLs (20 Units total) into the skin 2 (two) times daily before a meal. 03/06/22   Little Ishikawa, MD  insulin regular (NOVOLIN  R) 100 units/mL injection Inject 5-8 Units  into the skin 3 (three) times daily before meals. 03/06/22   Little Ishikawa, MD  Insulin Syringe-Needle U-100 30G X 1/2" 0.5 ML MISC Use up to 4 times daily 03/06/22   Little Ishikawa, MD  Iron, Ferrous Sulfate, 325 (65 Fe) MG TABS Take 325 mg by mouth every other day. 03/20/22   [name], NP  Multiple Vitamin (MULTIVITAMIN) tablet Take 1 tablet by mouth daily.    [provider]  oxyCODONE-acetaminophen (PERCOCET/ROXICET) 5-325 MG tablet Take 1-2 tablets by mouth every 4 (four) hours as needed for severe pain. 03/06/22   Little Ishikawa, MD  pravastatin (PRAVACHOL) 40 MG tablet Take 1 tablet (40 mg total) by mouth daily. 03/06/22   Little Ishikawa, MD  Vitamin D, Ergocalciferol, (DRISDOL) 1.25 MG (50000 UNIT) CAPS capsule Take 1 capsule (50,000 Units total) by mouth every 7 (seven) days. 03/20/22   [name], NP      Allergies    Latex and Tape    Review of Systems   Review of Systems  All other systems reviewed and are negative.   Physical Exam Updated Vital Signs BP 121/73   Pulse 98   Temp 99 F (37.2 C)   Resp 16   Wt 102.1 kg   SpO2 99%   BMI 36.87 kg/m  Physical Exam Vitals and nursing  note reviewed.  Constitutional:      General: She is not in acute distress.    Appearance: Normal appearance.  Eyes:     General: No scleral icterus.    Extraocular Movements: Extraocular movements intact.  Cardiovascular:     Rate and Rhythm: Normal rate and regular rhythm.     Pulses: Normal pulses.     Heart sounds: Normal heart sounds.  Pulmonary:     Effort: Pulmonary effort is normal. No respiratory distress.     Breath sounds: Normal breath sounds.  Abdominal:     Palpations: Abdomen is soft. There is no mass.     Tenderness: There is no abdominal tenderness.  Musculoskeletal:        General: Normal range of motion.     Cervical back: Neck supple.     Comments: Tenderness to palpation  noted to anterior aspect of left fifth toe with area of fluctuance noted.  Tenderness to palpation extends to the base of the left MTP and lateral malleolus.  Pitting edema noted to bilateral lower extremities.  Patient will full range of motion of left foot.  No appreciable drainage noted on today's examination.  No appreciable area of erythema.  Patient with increased warmth noted to the area.  Skin:    General: Skin is warm and dry.     Findings: No rash.  Neurological:     Mental Status: She is alert.     Sensory: Sensation is intact.     Motor: Motor function is intact.  Psychiatric:        Behavior: Behavior normal.        ED Results / Procedures / Treatments   Labs (all labs ordered are listed, but only abnormal results are displayed) Labs Reviewed  BASIC METABOLIC PANEL - Abnormal; Notable for the following components:      Result Value   Sodium 131 (*)    Glucose, Bld 430 (*)    BUN 28 (*)    Creatinine, Ser 1.31 (*)    Calcium 8.5 (*)    GFR, Estimated 51 (*)    All other components within normal limits  CBC WITH DIFFERENTIAL/PLATELET - Abnormal; Notable for the following components:   RBC 3.25 (*)    Hemoglobin 9.2 (*)    HCT 28.7 (*)    All other components within normal limits  URINALYSIS, ROUTINE W REFLEX MICROSCOPIC - Abnormal; Notable for the following components:   Glucose, UA >=500 (*)    Hgb urine dipstick TRACE (*)    Ketones, ur 15 (*)    Protein, ur 100 (*)    All other components within normal limits  BETA-HYDROXYBUTYRIC ACID - Abnormal; Notable for the following components:   Beta-Hydroxybutyric Acid 0.67 (*)    All other components within normal limits  URINALYSIS, MICROSCOPIC (REFLEX) - Abnormal; Notable for the following components:   Bacteria, UA RARE (*)    All other components within normal limits  CBG MONITORING, ED - Abnormal; Notable for the following components:   Glucose-Capillary 332 (*)    All other components within normal limits   CULTURE, BLOOD (ROUTINE X 2)  CULTURE, BLOOD (ROUTINE X 2)  LACTIC ACID, PLASMA  PREGNANCY, URINE  LACTIC ACID, PLASMA    EKG None  Radiology DG Foot Complete Left  Result Date: 10/09/2022 CLINICAL DATA:  left fifth toe pain/drainage EXAM: LEFT FOOT - COMPLETE 3 VIEW; LEFT ANKLE COMPLETE - 3 VIEW COMPARISON:  None Available. FINDINGS: There is no evidence  of fracture, dislocation, or subluxation. There is no evidence of arthropathy or other focal bone abnormality. Soft tissue swelling noted at the ankle mortise. Fifth distal phalanx soft tissue swelling identified with suggestion of a defect consistent with a wound. Fifth distal phalanx is thin and sclerotic in the possibility of osteomyelitis cannot be excluded. IMPRESSION: Soft tissue swelling at the ankle mortise and fifth distal phalanx. Cannot rule out osteomyelitis of the fifth distal phalanx. Electronically Signed   By: Sammie Bench M.D.   On: 10/09/2022 10:47   DG Ankle Complete Left  Result Date: 10/09/2022 CLINICAL DATA:  left fifth toe pain/drainage EXAM: LEFT FOOT - COMPLETE 3 VIEW; LEFT ANKLE COMPLETE - 3 VIEW COMPARISON:  None Available. FINDINGS: There is no evidence of fracture, dislocation, or subluxation. There is no evidence of arthropathy or other focal bone abnormality. Soft tissue swelling noted at the ankle mortise. Fifth distal phalanx soft tissue swelling identified with suggestion of a defect consistent with a wound. Fifth distal phalanx is thin and sclerotic in the possibility of osteomyelitis cannot be excluded. IMPRESSION: Soft tissue swelling at the ankle mortise and fifth distal phalanx. Cannot rule out osteomyelitis of the fifth distal phalanx. Electronically Signed   By: Sammie Bench M.D.   On: 10/09/2022 10:47    Procedures Procedures    Medications Ordered in ED Medications  ceFEPIme (MAXIPIME) 2 g in sodium chloride 0.9 % 100 mL IVPB (has no administration in time range)  vancomycin (VANCOREADY)  IVPB 1250 mg/250 mL (has no administration in time range)  metroNIDAZOLE (FLAGYL) tablet 500 mg (500 mg Oral Given 10/09/22 1129)  ceFEPIme (MAXIPIME) 2 g in sodium chloride 0.9 % 100 mL IVPB (0 g Intravenous Stopped 10/09/22 1150)  vancomycin (VANCOCIN) IVPB 1000 mg/200 mL premix (0 mg Intravenous Stopped 10/09/22 1307)    And  vancomycin (VANCOCIN) IVPB 1000 mg/200 mL premix (0 mg Intravenous Stopped 10/09/22 1409)  diphenhydrAMINE (BENADRYL) injection 25 mg (25 mg Intravenous Given 10/09/22 1158)    ED Course/ Medical Decision Making/ A&P Clinical Course as of 10/09/22 1517  Tue Oct 09, 2022  1157 Discussed with patient plans for admission. Patient agreeable at this time.  [SB]  1406 Consult with hospitalist, Dr. Sloan Leiter who agrees with admission at this time.  [SB]    Clinical Course User Index [SB] Mikyah Alamo A, PA-C                             Medical Decision Making Amount and/or Complexity of Data Reviewed Labs: ordered. Radiology: ordered.  Risk Prescription drug management. Decision regarding hospitalization.   Pt presents with concerns for left fifth toe infection x 2 days.  History of similar symptoms.  Has diabetes and compliant with medication.  Patient afebrile.  On exam, pt with Tenderness to palpation noted to anterior aspect of left fifth toe with area of fluctuance noted.  Tenderness to palpation extends to the base of the left MTP and lateral malleolus.  Pitting edema noted to bilateral lower extremities.  Patient will full range of motion of left foot.  No appreciable drainage noted on today's examination.  No appreciable area of erythema.  Patient with increased warmth noted to the area. No acute cardiovascular, respiratory exam findings. Differential diagnosis includes diabetic foot infection, osteomyelitis, cellulitis, abscess.    Co morbidities that complicate the patient evaluation: Diabetes, CKD, CHF  Additional history obtained:  External records from outside  source obtained and reviewed  including: Patient was admitted to the hospital 02/25/2022 for similar symptoms due to concerns for diabetic foot infection.  Labs:  I ordered, and personally interpreted labs.  The pertinent results include:   Urine pregnancy negative Urinalysis without signs for acute cystitis Blood cultures ordered results pending at time of admission Lactic initial lactic at 1.3 BMP with elevated creatinine at 1.31, elevated BUN at 28, decreased GFR 51, stable from baseline values CBC without leukocytosis, hemoglobin stable at 9.2 CBG elevated 332 (patient notes that she missed her dose of her diabetic medications last night) Beta hydroxybutyrate slightly elevated at 0.67 decreased from previous admissions elevation  Imaging: I ordered imaging studies including left foot and ankle x-ray I independently visualized and interpreted imaging which showed:  Soft tissue swelling at the ankle mortise and fifth distal phalanx.  Cannot rule out osteomyelitis of the fifth distal phalanx.   I agree with the radiologist interpretation  Medications:  I ordered medication including vancomycin, cefepime, Flagyl, Benadryl for symptom management I have reviewed the patients home medicines and have made adjustments as needed   Consultations: I requested consultation with the Hospitalist, Dr. Sloan Leiter and discussed lab and imaging findings as well as pertinent plan - they recommend: Who agrees with admission at this time  Disposition: Presentation suspicious for osteomyelitis of left foot.  Doubt concerns at this time for cellulitis or abscess. After consideration of the diagnostic results and the patients response to treatment, I feel that the patient would benefit from Admission to the hospital.  Discussed with patient plans for admission.  Patient agreeable to admission at this time.  Patient appears safe for admission.   This chart was dictated using voice recognition software, Dragon.  Despite the best efforts of this provider to proofread and correct errors, errors may still occur which can change documentation meaning.   Final Clinical Impression(s) / ED Diagnoses Final diagnoses:  Osteomyelitis of left foot, unspecified type    Rx / DC Orders ED Discharge Orders     None         Abenezer Odonell A, PA-C 10/09/22 1633    Lajean Saver, MD 10/10/22 [phone]

## 2022-10-09 NOTE — ED Notes (Signed)
Report given to RN Melanie at [facility].

## 2022-10-10 ENCOUNTER — Inpatient Hospital Stay (HOSPITAL_COMMUNITY): Payer: Self-pay

## 2022-10-10 DIAGNOSIS — M869 Osteomyelitis, unspecified: Secondary | ICD-10-CM | POA: Insufficient documentation

## 2022-10-10 DIAGNOSIS — E669 Obesity, unspecified: Secondary | ICD-10-CM

## 2022-10-10 DIAGNOSIS — L0390 Cellulitis, unspecified: Secondary | ICD-10-CM

## 2022-10-10 LAB — GLUCOSE, CAPILLARY
Glucose-Capillary: 404 mg/dL — ABNORMAL HIGH (ref 70–99)
Glucose-Capillary: 201 mg/dL — ABNORMAL HIGH (ref 70–99)
Glucose-Capillary: 181 mg/dL — ABNORMAL HIGH (ref 70–99)
Glucose-Capillary: 115 mg/dL — ABNORMAL HIGH (ref 70–99)

## 2022-10-10 LAB — VAS US ABI WITH/WO TBI
Left ABI: 1.03
Right ABI: 1.07

## 2022-10-10 LAB — ABO/RH: ABO/RH(D): A POS

## 2022-10-10 LAB — TYPE AND SCREEN
ABO/RH(D): A POS
Antibody Screen: NEGATIVE

## 2022-10-10 MED ORDER — INSULIN ASPART 100 UNIT/ML IJ SOLN
15.0000 [IU] | Freq: Once | INTRAMUSCULAR | Status: AC
  Administered 2022-10-10: 15 [IU] via SUBCUTANEOUS

## 2022-10-10 MED ORDER — INSULIN GLARGINE-YFGN 100 UNIT/ML ~~LOC~~ SOLN
20.0000 [IU] | Freq: Every day | SUBCUTANEOUS | Status: DC
  Administered 2022-10-10: 20 [IU] via SUBCUTANEOUS
  Filled 2022-10-10 (×2): qty 0.2

## 2022-10-10 NOTE — Progress Notes (Signed)
ABI w/ TBI study completed.   Please see CV Proc for preliminary results.   Marzetta Lanza, RDMS, RVT  

## 2022-10-10 NOTE — Assessment & Plan Note (Signed)
Continue statin therapy.

## 2022-10-10 NOTE — Assessment & Plan Note (Addendum)
Uncontrolled hyperglycemia.   Patient was placed on basal insulin, pre meal short acting insulin, along with insulin sliding scale. At the time of her discharge her fasting glucose is 217 mg/dl.  Patient will resume her home insulin regimen at the time of her discharge.   Noted pseudohyponatremia due to hyperglycemia.

## 2022-10-10 NOTE — Progress Notes (Signed)
Progress Note   Patient: Abigail Moran [id number] DOB: October 30, 1975 DOA: 10/09/2022     1 DOS: the patient was seen and examined on 10/10/2022   Brief hospital course: Mrs. Prehn was admitted to the hospital with the working diagnosis of left diabetic foot infection.   47 yo female with the past medical history of T2DM, heart failure, hypertension, coronary artery disease, and dyslipidemia who presented with left toe pain for the last 2 days. Positive local pain at the left 5th toe, with drainage and discoloration. On her initial physical examination her blood pressure was 118/82, HR 93, RR 18 and 02 saturation 97%, lungs with no wheezing or rales, heart with S1 and S2 present and rhythmic, abdomen with no distention, no lower extremity edema. Left foot with left toe discoloration with local edema and drainage.   Na 131, K 4,0 Cl 99, bicarbonate 22, glucose 430, bun 28 cr 1,31 anion gap 10.  Lactic acid 1,3 and 1,5 Wbc 7,6 hgb 9,2 plt 259   Urine analysis with DG 1,025, protein 100, glucose >500, negative leukocytes.   Assessment and Plan: * Diabetic foot infection Left diabetic foot infection, 5th toe.  Left ankle radiograph with signs of osteomyelitis of distal left phalanx.   Plan to continue antibiotic therapy with Vancomycin, cefepime and metronidazole.  Continue main control with oral hydrocodone. Further work up with left foot MRI and left lower extremities ABI.  Case discussed with podiatry, with recommendations to consult vascular surgery. Possible amputation tomorrow or on Friday.   Primary hypertension Blood pressure has been stable, 103 to [id number] mmHg systolic.  Plan to continue to hold on antihypertensive medications.   Uncontrolled type 1 diabetes mellitus with hyperglycemia Uncontrolled hyperglycemia.   Will resume basal insulin 20 units daily, if persistent high glucose will increase to 20 bid (home regimen) Continue insulin sliding scale for glucose cover and  monitoring.  Will calculate insulin requirements for further dose adjustments.   Dyslipidemia, continue with statin therapy.   CAD (coronary artery disease) No chest pain. Continue blood pressure monitoring.  CHF (congestive heart failure) No clinical signs of heart failure exacerbation.  Echocardiogram from 2016 with preserved LV systolic function.  Currently chronic diastolic heart failure with no exacerbation.   Hyperlipidemia Continue statin therapy.  Class 2 obesity Calculated BMI is 36.4        Subjective: Patent with no chest pain or dyspnea, left foot pain is controlled with oral analgesics. No nausea or vomiting.  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

## 2022-10-10 NOTE — Assessment & Plan Note (Signed)
No clinical signs of heart failure exacerbation.  Echocardiogram from 2016 with preserved LV systolic function.  Currently chronic diastolic heart failure with no exacerbation.

## 2022-10-10 NOTE — Assessment & Plan Note (Signed)
Calculated BMI is 36.4

## 2022-10-10 NOTE — Consult Note (Addendum)
Hospital Consult    Reason for Consult: Diabetic left foot infection  Requesting Physician:  Dr. Cathlean Sauer MRN #:  [id number]  History of Present Illness: This is a 47 y.o. female with PMH of HTN, HLD, CHF, CAD, Type II Diabetes Mellitus, and PAD who presented to ER with left toe pain. She is known to [facility] from prior right foot infection. She had undergone Angiography by Dr. Carlis Abbott in August of 2023 that did not show any significant arterial disease. She subsequently underwent right foot debridement and amputation of right 4th and 5th toes. These have healed. She now started having left 5th toe pain over the past week. She reports that she has had a corn present on her toe and it became more sore. She said she squeezed on it and noticed some pink, puss like drainage. She does have some decreased sensation in both of her toes/ feet which has been present for some time. She denies any injury to the toe that she knows of. She does not report any pain in her legs on ambulation or rest. No other tissue loss. She has been under management of Podiatrist, Dr. Amalia Hailey. She has been evaluated by Podiatry, Dr. Loel Lofty during this admission with plans for 5th toe amputation following any vascular intervention.  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   Heart attack 05/04/15   History of DVT (deep vein thrombosis)    following hospitalization 2020   History of kidney stones    Menorrhagia with irregular cycle 10/11/2020   Renal disorder    Sepsis due to cellulitis 05/15/2021   Vitamin D deficiency 09/14/2014    Past Surgical History:  Procedure Laterality Date   ABDOMINAL AORTOGRAM W/LOWER EXTREMITY N/A 03/01/2022   Procedure: ABDOMINAL AORTOGRAM  W/LOWER EXTREMITY;  Surgeon: Marty Heck, MD;  Location: [facility];  Service: Cardiovascular;  Laterality: N/A;   AMPUTATION Right 03/02/2022   Procedure: AMPUTATION OF FOURTH AND FIFTH TOES, Irrigation and debridment;  Surgeon: Edrick Kins, DPM;  Location: [facility];  Service: Podiatry;  Laterality: Right;   CARDIAC CATHETERIZATION  2016   CHOLECYSTECTOMY  2001   I & D EXTREMITY Right 03/05/2022   Procedure: IRRIGATION AND DEBRIDEMENT AND CLOSURE RIGHT FOOT;  Surgeon: Lorenda Peck, DPM;  Location: [facility];  Service: Podiatry;  Laterality: Right;   WISDOM TOOTH EXTRACTION      Allergies  Allergen Reactions   Latex Itching    Tears skin   Tape Hives    Prior to Admission medications   Medication Sig Start Date End Date Taking? Authorizing Provider  Accu-Chek Softclix Lancets lancets USE FOUR TIMES DAILY 03/06/22   Little Ishikawa, MD  acetaminophen (TYLENOL) 325 MG tablet Take 325 mg by mouth daily as needed (pain).    [provider]  blood glucose meter kit and supplies KIT Dispense based on patient and insurance preference. Use up to four times daily as directed. E11.9 03/06/22   Little Ishikawa, MD  Blood Glucose Monitoring Suppl (ACCU-CHEK GUIDE) w/Device KIT Use 4 times Daily 03/06/22   Little Ishikawa, MD  cephALEXin (KEFLEX) 500 MG capsule Take 1 capsule (500 mg total) by mouth 3 (three)  times daily. 04/02/22   Edrick Kins, DPM  glucose blood test strip USE FOUR TIMES DAILY 03/06/22   Little Ishikawa, MD  ibuprofen (ADVIL) 200 MG tablet Take 400 mg by mouth daily as needed (pain).    [provider]  insulin NPH Human (NOVOLIN N) 100 UNIT/ML injection Inject 0.2 mLs (20 Units total) into the skin 2 (two) times daily before a meal. 03/06/22   Little Ishikawa, MD  insulin regular (NOVOLIN R) 100 units/mL injection Inject 5-8 Units  into the skin 3 (three) times daily before meals. 03/06/22   Little Ishikawa, MD  Insulin  Syringe-Needle U-100 30G X 1/2" 0.5 ML MISC Use up to 4 times daily 03/06/22   Little Ishikawa, MD  Iron, Ferrous Sulfate, 325 (65 Fe) MG TABS Take 325 mg by mouth every other day. 03/20/22   [name], NP  Multiple Vitamin (MULTIVITAMIN) tablet Take 1 tablet by mouth daily.    [provider]  oxyCODONE-acetaminophen (PERCOCET/ROXICET) 5-325 MG tablet Take 1-2 tablets by mouth every 4 (four) hours as needed for severe pain. 03/06/22   Little Ishikawa, MD  pravastatin (PRAVACHOL) 40 MG tablet Take 1 tablet (40 mg total) by mouth daily. 03/06/22   Little Ishikawa, MD  Vitamin D, Ergocalciferol, (DRISDOL) 1.25 MG (50000 UNIT) CAPS capsule Take 1 capsule (50,000 Units total) by mouth every 7 (seven) days. 03/20/22   [name], NP    Social History   Socioeconomic History   Marital status: Single    Spouse name: Not on file   Number of children: Not on file   Years of education: Not on file   Highest education level: Not on file  Occupational History   Not on file  Tobacco Use   Smoking status: Never   Smokeless tobacco: Never  Vaping Use   Vaping Use: Never used  Substance and Sexual Activity   Alcohol use: Not Currently    Comment: 3 x per month   Drug use: Not Currently    Frequency: 2.0 times per week    Types: Marijuana   Sexual activity: Not Currently  Other Topics Concern   Not on file  Social History Narrative   Works as Probation officer    Lives with common law husband   No children   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   Grew up in [place] Determinants of Health   Financial Resource Strain: Not on file  Food Insecurity: No Food Insecurity (10/09/2022)   Hunger Vital Sign    Worried About Running Out of Food in the Last Year: Never true    Ran Out of Food in the Last Year: Never true  Transportation Needs: No Transportation Needs (10/09/2022)   PRAPARE - Hydrologist (Medical): No    Lack of Transportation  (Non-Medical): No  Physical Activity: Not on file  Stress: Not on file  Social Connections: Not on file  Intimate Partner Violence: Not At Risk (10/09/2022)   Humiliation, Afraid, Rape, and Kick questionnaire    Fear of Current or Ex-Partner: No    Emotionally Abused: No    Physically Abused: No    Sexually Abused: No     Family History  Problem Relation Age of Onset   Diabetes Father    Hypertension Father    Hyperlipidemia Father    Liver cancer Maternal Grandfather    Kidney disease Neg Hx    Heart disease Neg Hx  Colon polyps Neg Hx    Colon cancer Neg Hx    Stomach cancer Neg Hx    Rectal cancer Neg Hx     ROS: Otherwise negative unless mentioned in HPI  Physical Examination  Vitals:   10/10/22 0500 10/10/22 0845  BP: 103/63 122/71  Pulse: 96 97  Resp: 16 14  Temp: 99 F (37.2 C) 98.9 F (37.2 C)  SpO2: 97% 98%   Body mass index is 36.49 kg/m.  General:  WDWN in NAD Gait: Not observed HENT: WNL, normocephalic Pulmonary: normal non-labored breathing, without Rales, rhonchi,  wheezing Cardiac: regular Abdomen: soft Vascular Exam/Pulses: 2+ femoral,  2+ Dp and PT pulses bilaterally. Feet warm and well perfused Extremities: without ischemic changes, without Gangrene , without cellulitis; with wound of left 5th toe. No tenderness present extending onto dorsum of foot. Toe itself is tender with firm compression. No appreciable drainage  Musculoskeletal: no muscle wasting or atrophy  Neurologic: A&O X 3;  No focal weakness or paresthesias are detected; speech is fluent/normal Psychiatric:  The pt has Normal affect.  CBC    Component Value Date/Time   WBC 7.6 10/09/2022 1100   RBC 3.25 (L) 10/09/2022 1100   HGB 9.2 (L) 10/09/2022 1100   HGB 8.7 (L) 10/10/2020 1031   HCT 28.7 (L) 10/09/2022 1100   PLT 259 10/09/2022 1100   PLT 236 10/10/2020 1031   MCV 88.3 10/09/2022 1100   MCH 28.3 10/09/2022 1100   MCHC 32.1 10/09/2022 1100   RDW 13.6 10/09/2022  1100   LYMPHSABS 1.1 10/09/2022 1100   MONOABS 0.4 10/09/2022 1100   EOSABS 0.1 10/09/2022 1100   BASOSABS 0.0 10/09/2022 1100    BMET    Component Value Date/Time   NA 131 (L) 10/09/2022 1100   K 4.0 10/09/2022 1100   CL 99 10/09/2022 1100   CO2 22 10/09/2022 1100   GLUCOSE 430 (H) 10/09/2022 1100   BUN 28 (H) 10/09/2022 1100   CREATININE 1.31 (H) 10/09/2022 1100   CREATININE 0.93 05/26/2021 1611   CALCIUM 8.5 (L) 10/09/2022 1100   GFRNONAA 51 (L) 10/09/2022 1100   GFRNONAA 53 (L) 10/10/2020 1031   GFRAA >60 03/19/2020 0335    COAGS: Lab Results  Component Value Date   INR 1.0 03/17/2020     Non-Invasive Vascular Imaging:   ABI pending  Statin:  Yes.   Beta Blocker:  No. Aspirin:  No. ACEI:  No. ARB:  No. CCB use:  No Other antiplatelets/anticoagulants:  No.    ASSESSMENT/PLAN: This is a 47 y.o. female PMH of HTN, HLD, CHF, CAD, Type II Diabetes Mellitus, and PAD who presented to ER with left toe pain. She started having left 5th toe pain over the past week. She reports that she has had a corn present on her toe and it became more sore. She said she squeezed on it and noticed some pink, puss like drainage. She does have some decreased sensation in both of her toes/ feet which has been present for some time. She denies any injury to the toe that she knows of. She does not have any pain on ambulation or rest. She has been under management of Podiatrist, Dr. Amalia Hailey. She has been evaluated by Podiatry, Dr. Loel Lofty during this admission with plans for 5th toe amputation following any vascular intervention. She has palpable pulses on examination. I suspect that she does not have any arterial insufficiency in BLE but will assess further with ABI. ABI is pending. I did  place orders for possible Angiogram with Dr. Carlis Abbott for tomorrow pending ABI results, however she may not even need this if they are normal. The on call vascular surgeon, Dr. Stanford Breed will see patient later this  afternoon to provide further management plans.     Karoline Caldwell PA-C [facility] [phone] 10/10/2022  9:01 AM  VASCULAR STAFF ADDENDUM: I have independently interviewed and examined the patient. I agree with the above.  Reviewed clinical exam and non-invasive findings with the patient. Low clinical utility for angiogram. Suspect this will be normal. She and I agreed to defer angiogram for now. She can proceed with toe amputation at any time. Should she fail to heal or have issues with healing, we will see her and re-evaluate angiography. Please call for any questions.  Yevonne Aline. Stanford Breed, MD [facility] Phone Number: [phone] 10/10/2022 7:36 PM

## 2022-10-10 NOTE — Hospital Course (Addendum)
Mrs. Motamedi was admitted to the hospital with the working diagnosis of left diabetic foot infection.   47 yo female with the past medical history of T2DM, heart failure, hypertension, coronary artery disease, and dyslipidemia who presented with left toe pain for the last 2 days. Positive local pain at the left 5th toe, with drainage and discoloration. On her initial physical examination her blood pressure was 118/82, HR 93, RR 18 and 02 saturation 97%, lungs with no wheezing or rales, heart with S1 and S2 present and rhythmic, abdomen with no distention, no lower extremity edema. Left foot with left toe discoloration with local edema and drainage.   Na 131, K 4,0 Cl 99, bicarbonate 22, glucose 430, bun 28 cr 1,31 anion gap 10.  Lactic acid 1,3 and 1,5 Wbc 7,6 hgb 9,2 plt 259   Urine analysis with DG 1,025, protein 100, glucose >500, negative leukocytes.   04/04 plan for 5th toe amputation today.  04/05 post op amputation of 5th toe left foot with no complications.  Patient with hyperglycemia.  04/06 surgical culture positive for gram positive cocci and gram positive rods.

## 2022-10-10 NOTE — Plan of Care (Signed)

## 2022-10-10 NOTE — Inpatient Diabetes Management (Signed)
Inpatient Diabetes Program Recommendations  AACE/ADA: New Consensus Statement on Inpatient Glycemic Control (2015)  Target Ranges:  Prepandial:   less than 140 mg/dL      Peak postprandial:   less than 180 mg/dL (1-2 hours)      Critically ill patients:  140 - 180 mg/dL   Lab Results  Component Value Date   GLUCAP 201 (H) 10/10/2022   HGBA1C 13.1 (H) 02/26/2022    Latest Reference Range & Units 10/09/22 09:41 10/09/22 19:02 10/09/22 21:08 10/10/22 08:34 10/10/22 12:10  Glucose-Capillary 70 - 99 mg/dL 332 (H) 190 (H) 366 (H) Novolog 5 units 404 (H) Novolog 15 units 201 (H) Novolog 5 units  (H): Data is abnormally high  Diabetes history: DM1 (Makes no insulin-requires basal, meal coverage if eating and correction) Outpatient Diabetes medications: NPH 20 units BID, Novolin R 5-8 units TID  Current orders for Inpatient glycemic control: Semglee 20 units qd, Novolog correction 0-15 units tid, 0-5 units hs  Inpatient Diabetes Program Recommendations:   DM coordinator spoke with patient on prior admission on 02/26/22 regarding A1c of 13.1 and glycemic management.Was dismissed from Dr Arman Filter office in December 2022. Noted patient will be NPO for possible surgery in am.  Please consider: -Decrease Novolog correction to 0-9 units tid, 0-5 units hs when eating, then q 4 hrs. When transitions to NPO -Add Novolog 3-4 units tid meal coverage when eating 50% meals -Increase Semglee as needed  Thank you, Bethena Roys E. Jaevin Medearis, RN, MSN, CDE  Diabetes Coordinator Inpatient Glycemic Control Team Team Pager [phone] (8am-5pm) 10/10/2022 12:43 PM

## 2022-10-10 NOTE — Consult Note (Signed)
PODIATRY CONSULTATION  NAME Abigail Moran MRN [id number] DOB 07/13/1975 DOA 10/09/2022   Reason for consult:  Chief Complaint  Patient presents with   Toe Pain    Left fifith    Attending/Consulting physician: Arrien MD  History of present illness: 47 y.o. female w /medical history significant of diabetes, hypertension, hyperlipidemia, CHF, CAD, gastroparesis, menorrhagia presenting with toe pain. Patient presenting with left toe pain for the past 2 days.  It is specifically at her left fifth toe which has been painful, itchy, black with pink drainage.  Reports that the pain radiates up to her ankle.  She reports he had similar issue in the past and was admitted to the hospital for. She has previously had R 4th and 5th toe amp. She had vascular procedure done prior to the that surgery to ensure blood flow adequate for healing but reports it did heal well. Has not seen Dr. Amalia Hailey since last September when final post op for R foot surgery occurred.   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   Heart attack 05/04/15   History of DVT (deep vein thrombosis)    following hospitalization 2020   History of kidney stones    Menorrhagia with irregular cycle 10/11/2020   Renal disorder    Sepsis due to cellulitis 05/15/2021   Vitamin D deficiency 09/14/2014       Latest Ref Rng & Units 10/09/2022   11:00 AM 03/19/2022   11:43 AM 03/06/2022    3:35 AM  CBC  WBC 4.0 - 10.5 K/uL 7.6  2.9  8.7   Hemoglobin 12.0 - 15.0 g/dL 9.2  9.9  8.9   Hematocrit 36.0 - 46.0 % 28.7  30.7  28.1   Platelets 150 - 400 K/uL 259  386.0  409        Latest Ref Rng & Units 10/09/2022   11:00 AM 03/19/2022   11:43 AM 03/06/2022    3:35 AM  BMP  Glucose 70 - 99  mg/dL 430  232  315   BUN 6 - 20 mg/dL 28  23  16    Creatinine 0.44 - 1.00 mg/dL 1.31  1.18  1.07   Sodium 135 - 145 mmol/L 131  134  135   Potassium 3.5 - 5.1 mmol/L 4.0  4.1  4.5   Chloride 98 - 111 mmol/L 99  101  102   CO2 22 - 32 mmol/L 22  23  20    Calcium 8.9 - 10.3 mg/dL 8.5  9.2  8.7       Physical Exam: Lower Extremity Exam Vasc: R - PT non palpable, DP palpable. Cap refill < 3 sec to digits  L - PT non palpable, DP palpable. Cap refill <3 sec to digits  Derm: R - Normal temp/texture/turgor with no open lesion or clinical signs of infection  L - Ulceration to the distal lateral 5th toe, gangrenous changes with hyperkeratotic tissue, edema and mild pain of th L 5th toe      MSK:  R - Prior R 4th and 5th toe amp  L -  No gross deformities. Compartments soft, non-tender, compressible  Neuro: R - Gross sensation diminished. Gross motor  function intact   L - Gross sensation diminished. Gross motor function intact    ASSESSMENT/PLAN OF CARE 47 y.o. female with PMHx significant for medical history significant of diabetes w neuropathy and angiopathy, PAD, prior R 4th and 5th toe amp, hypertension, hyperlipidemia, CHF, CAD, gastroparesis, menorrhagia  with ulceration and gangrenous changes to the L 5th toe, concern for osteomyelitis of the L 5th toe.   AF VSS WBC 7.6 XR L foot: Soft tissue swelling at the ankle mortise and fifth distal phalanx. Cannot rule out osteomyelitis of the fifth distal phalanx. MRI L foot: p ABI/PVR: p    - Plan for vascular surgery eval for ensuring adequate flow for L 5th digit amputation, abi/pvr scheduled for 1pm today - Likely plan for L 5th toe amp at MPJ level, tmrw vs Friday afternoon - MRI ordered for L foot wo contrast - Continue IV abx broad spectrum pending further culture data - Anticoagulation: per primary, hold pending OR - Wound care: betadine pain prn to toe - WB status: WBAT to LLE - Will continue to follow   Thank you for  the consult.  Please contact me directly with any questions or concerns.           Everitt Amber, DPM [facility]    [address], [place] [address]                Office [phone]  Fax [phone]

## 2022-10-10 NOTE — Assessment & Plan Note (Addendum)
Left diabetic foot infection, 5th toe. (No sepsis).  Left ankle radiograph with signs of osteomyelitis of distal left phalanx.  MRI left foot with fifth toe cellulitis and soft tissue ulcer with underlying osteomyelitis of the distal and middle phalanges and early osteomyelitis of the proximal phalanx. Adjacent plantar fluid collection measuring 0,8 x 0,4 x 0,8 cm that could represent and abscess.   Left ABI are normal.  Vascular surgery consultation with no vascular intervention needed at this point.   04/04 left foot amputation of 5th toe at metatarsal phalangeal joint level.   Patient was treated with IV antibiotic therapy during her hospitalization with vancomycin, cefepime and metronidazole, OR cultures positive for gram positive cocci and gram positive rods, it has been re incubated for better growth. She has remained afebrile and her discharge wbc is 6.7   Case discussed with ID, Dr, Daiva Eves will transition to oral linezoild  for 1 month and follow up with ID as outpatient.  Continue post op care per podiatry, weight bearing as tolerated with post op shoe.

## 2022-10-10 NOTE — TOC Initial Note (Signed)
Transition of Care [facility]) - Initial/Assessment Note    Patient Details  Name: Abigail Moran MRN: [id number] Date of Birth: [birth year]  Transition of [facility]) CM/SW Contact:    Cyndi Bender, RN Phone Number: 10/10/2022, 1:15 PM  Clinical Narrative:                  Spoke to patient regarding transition needs.  Patient states she missed one payment for insurance but she can make the payment and she will have insurance.  Patient plans to make payment and will give hospital insurance information.  TOC following.  Expected Discharge Plan: Home/Self Care  Barriers to Discharge: Continued Medical Work up   Patient Goals and CMS Choice Patient states their goals for this hospitalization and ongoing recovery are:: return home          Expected Discharge Plan and Services       Living arrangements for the past 2 months: Single Family Home                                      Prior Living Arrangements/Services Living arrangements for the past 2 months: Single Family Home   Patient language and need for interpreter reviewed:: Yes Do you feel safe going back to the place where you live?: Yes      Need for Family Participation in Patient Care: Yes (Comment) Care giver support system in place?: Yes (comment)   Criminal Activity/Legal Involvement Pertinent to Current Situation/Hospitalization: No - Comment as needed  Activities of Daily Living Home Assistive Devices/Equipment: None ADL Screening (condition at time of admission) Patient's cognitive ability adequate to safely complete daily activities?: Yes Is the patient deaf or have difficulty hearing?: No Does the patient have difficulty seeing, even when wearing glasses/contacts?: No Does the patient have difficulty concentrating, remembering, or making decisions?: No Patient able to express need for assistance with ADLs?: Yes Does the patient have difficulty dressing or bathing?: No Independently performs  ADLs?: Yes (appropriate for developmental age) Does the patient have difficulty walking or climbing stairs?: No Weakness of Legs: None Weakness of Arms/Hands: None  Permission Sought/Granted                  Emotional Assessment Appearance:: Appears stated age Attitude/Demeanor/Rapport: Gracious Affect (typically observed): Accepting Orientation: : Oriented to Self, Oriented to Place, Oriented to  Time, Oriented to Situation Alcohol / Substance Use: Not Applicable Psych Involvement: No (comment)  Admission diagnosis:  Diabetic foot infection [E11.628, L08.9] Osteomyelitis of left foot, unspecified type [M86.9] Patient Active Problem List   Diagnosis Date Noted   Class 2 obesity 10/10/2022   Osteomyelitis of left foot 10/10/2022   Diabetic foot infection 02/26/2022   Acute-on-chronic kidney injury 02/26/2022   DKA, type 1 02/25/2022   Primary hypertension 05/26/2021   Hyponatremia 05/15/2021   Hypocalcemia 05/15/2021   Right shoulder pain 05/09/2021   Onychomycosis 03/31/2021   Menstrual bleeding problem 03/31/2021   Hyperlipidemia 03/31/2021   Menorrhagia with irregular cycle 10/11/2020   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   Iron deficiency anemia due to chronic blood loss 09/28/2020   Renal insufficiency    Gastroparesis 08/08/2016   Carpal tunnel syndrome 08/08/2016   CAD (coronary artery disease) 06/07/2015   CHF (congestive heart failure) 06/07/2015   Preventative health care 09/27/2014   Vitamin D deficiency 09/14/2014   Uncontrolled type 1 diabetes mellitus with hyperglycemia 10/06/2013  PCP:  [name], NP Pharmacy:   [facility] [address], [place] AT [facility] OF [place] [address] Phone: [phone] Fax: [phone]  [facility] [address], [place] [id number] [place] [address] Phone: [phone] Fax:  [phone]  [facility] [address] [place] [address] Phone: [phone] Fax: [phone]     Social Determinants of Health (SDOH) Social History: SDOH Screenings   Food Insecurity: No Food Insecurity (10/09/2022)  Housing: Low Risk  (10/09/2022)  Transportation Needs: No Transportation Needs (10/09/2022)  Utilities: Not At Risk (10/09/2022)  Depression (PHQ2-9): Low Risk  (03/31/2021)  Tobacco Use: Low Risk  (10/09/2022)   SDOH Interventions:     Readmission Risk Interventions    03/06/2022    2:06 PM  Readmission Risk Prevention Plan  Transportation Screening Complete  Home Care Screening Complete  Medication Review (RN CM) Complete

## 2022-10-10 NOTE — Assessment & Plan Note (Addendum)
Systolic blood pressure 120 to 130 mmHg.  Patient at home not on antihypertensive medications, will continue close monitoring for now.

## 2022-10-10 NOTE — Assessment & Plan Note (Signed)
No chest pain.  Continue blood pressure monitoring.  

## 2022-10-11 ENCOUNTER — Encounter (HOSPITAL_COMMUNITY): Payer: Self-pay | Admitting: Internal Medicine

## 2022-10-11 ENCOUNTER — Inpatient Hospital Stay (HOSPITAL_COMMUNITY): Payer: Self-pay

## 2022-10-11 ENCOUNTER — Inpatient Hospital Stay (HOSPITAL_COMMUNITY): Payer: Self-pay | Admitting: Certified Registered Nurse Anesthetist

## 2022-10-11 ENCOUNTER — Encounter (HOSPITAL_COMMUNITY)
Admission: EM | Disposition: A | Discharge status: Home / Self Care | Payer: Self-pay | Source: Home / Self Care | Attending: Internal Medicine

## 2022-10-11 ENCOUNTER — Other Ambulatory Visit: Payer: Self-pay

## 2022-10-11 ENCOUNTER — Encounter: Payer: Self-pay | Admitting: Hematology & Oncology

## 2022-10-11 DIAGNOSIS — I509 Heart failure, unspecified: Secondary | ICD-10-CM

## 2022-10-11 DIAGNOSIS — I110 Hypertensive heart disease with heart failure: Secondary | ICD-10-CM

## 2022-10-11 DIAGNOSIS — M869 Osteomyelitis, unspecified: Secondary | ICD-10-CM

## 2022-10-11 DIAGNOSIS — E1169 Type 2 diabetes mellitus with other specified complication: Secondary | ICD-10-CM

## 2022-10-11 DIAGNOSIS — I2510 Atherosclerotic heart disease of native coronary artery without angina pectoris: Secondary | ICD-10-CM

## 2022-10-11 DIAGNOSIS — Z794 Long term (current) use of insulin: Secondary | ICD-10-CM

## 2022-10-11 DIAGNOSIS — I252 Old myocardial infarction: Secondary | ICD-10-CM

## 2022-10-11 DIAGNOSIS — Z7984 Long term (current) use of oral hypoglycemic drugs: Secondary | ICD-10-CM

## 2022-10-11 HISTORY — PX: AMPUTATION TOE: SHX6595

## 2022-10-11 LAB — BASIC METABOLIC PANEL
Anion gap: 13 (ref 5–15)
BUN: 24 mg/dL — ABNORMAL HIGH (ref 6–20)
CO2: 19 mmol/L — ABNORMAL LOW (ref 22–32)
Calcium: 8.7 mg/dL — ABNORMAL LOW (ref 8.9–10.3)
Chloride: 100 mmol/L (ref 98–111)
Creatinine, Ser: 1.23 mg/dL — ABNORMAL HIGH (ref 0.44–1.00)
GFR, Estimated: 55 mL/min — ABNORMAL LOW (ref >60)
Glucose, Bld: 306 mg/dL — ABNORMAL HIGH (ref 70–99)
Potassium: 4.4 mmol/L (ref 3.5–5.1)
Sodium: 132 mmol/L — ABNORMAL LOW (ref 135–145)

## 2022-10-11 LAB — CBC WITH DIFFERENTIAL/PLATELET
Abs Immature Granulocytes: 0.03 10*3/uL (ref 0.00–0.07)
Basophils Absolute: 0 10*3/uL (ref 0.0–0.1)
Basophils Relative: 0 %
Eosinophils Absolute: 0.1 10*3/uL (ref 0.0–0.5)
Eosinophils Relative: 2 %
HCT: 27 % — ABNORMAL LOW (ref 36.0–46.0)
Hemoglobin: 8.8 g/dL — ABNORMAL LOW (ref 12.0–15.0)
Immature Granulocytes: 0 %
Lymphocytes Relative: 15 %
Lymphs Abs: 1.1 10*3/uL (ref 0.7–4.0)
MCH: 28.9 pg (ref 26.0–34.0)
MCHC: 32.6 g/dL (ref 30.0–36.0)
MCV: 88.5 fL (ref 80.0–100.0)
Monocytes Absolute: 0.5 10*3/uL (ref 0.1–1.0)
Monocytes Relative: 7 %
Neutro Abs: 5.4 10*3/uL (ref 1.7–7.7)
Neutrophils Relative %: 76 %
Platelets: 235 10*3/uL (ref 150–400)
RBC: 3.05 MIL/uL — ABNORMAL LOW (ref 3.87–5.11)
RDW: 13.7 % (ref 11.5–15.5)
WBC: 7.3 10*3/uL (ref 4.0–10.5)
nRBC: 0 % (ref 0.0–0.2)

## 2022-10-11 LAB — GLUCOSE, CAPILLARY
Glucose-Capillary: 363 mg/dL — ABNORMAL HIGH (ref 70–99)
Glucose-Capillary: 137 mg/dL — ABNORMAL HIGH (ref 70–99)
Glucose-Capillary: 175 mg/dL — ABNORMAL HIGH (ref 70–99)
Glucose-Capillary: 29 mg/dL — CL (ref 70–99)
Glucose-Capillary: 42 mg/dL — CL (ref 70–99)
Glucose-Capillary: 295 mg/dL — ABNORMAL HIGH (ref 70–99)
Glucose-Capillary: 370 mg/dL — ABNORMAL HIGH (ref 70–99)

## 2022-10-11 SURGERY — AMPUTATION, TOE
Anesthesia: General | Site: Toe | Laterality: Left

## 2022-10-11 MED ORDER — HYDROMORPHONE HCL 1 MG/ML IJ SOLN: 0.2500 mg | INTRAMUSCULAR | Status: DC | PRN

## 2022-10-11 MED ORDER — DEXTROSE 50 % IV SOLN
1.0000 | Freq: Once | INTRAVENOUS | Status: AC
  Administered 2022-10-11: 50 mL via INTRAVENOUS

## 2022-10-11 MED ORDER — INSULIN GLARGINE-YFGN 100 UNIT/ML ~~LOC~~ SOLN
10.0000 [IU] | Freq: Once | SUBCUTANEOUS | Status: AC
  Administered 2022-10-11: 10 [IU] via SUBCUTANEOUS
  Filled 2022-10-11: qty 0.1

## 2022-10-11 MED ORDER — PHENYLEPHRINE 80 MCG/ML (10ML) SYRINGE FOR IV PUSH (FOR BLOOD PRESSURE SUPPORT)
PREFILLED_SYRINGE | INTRAVENOUS | Status: DC | PRN
  Administered 2022-10-11 (×4): 160 ug via INTRAVENOUS

## 2022-10-11 MED ORDER — CHLORHEXIDINE GLUCONATE 0.12 % MT SOLN: 15.0000 mL | Freq: Once | OROMUCOSAL | Status: AC

## 2022-10-11 MED ORDER — MIDAZOLAM HCL 2 MG/2ML IJ SOLN
INTRAMUSCULAR | Status: DC | PRN
  Administered 2022-10-11: 1 mg via INTRAVENOUS

## 2022-10-11 MED ORDER — 0.9 % SODIUM CHLORIDE (POUR BTL) OPTIME
TOPICAL | Status: DC | PRN
  Administered 2022-10-11: 1000 mL

## 2022-10-11 MED ORDER — FENTANYL CITRATE (PF) 250 MCG/5ML IJ SOLN
INTRAMUSCULAR | Status: AC
  Filled 2022-10-11: qty 5

## 2022-10-11 MED ORDER — MIDAZOLAM HCL 2 MG/2ML IJ SOLN
INTRAMUSCULAR | Status: AC
  Filled 2022-10-11: qty 2

## 2022-10-11 MED ORDER — ONDANSETRON HCL 4 MG/2ML IJ SOLN
INTRAMUSCULAR | Status: DC | PRN
  Administered 2022-10-11: 4 mg via INTRAVENOUS

## 2022-10-11 MED ORDER — BUPIVACAINE HCL (PF) 0.5 % IJ SOLN
INTRAMUSCULAR | Status: DC | PRN
  Administered 2022-10-11: 5 mL

## 2022-10-11 MED ORDER — CHLORHEXIDINE GLUCONATE 0.12 % MT SOLN
OROMUCOSAL | Status: AC
  Filled 2022-10-11: qty 15

## 2022-10-11 MED ORDER — ONDANSETRON HCL 4 MG/2ML IJ SOLN
INTRAMUSCULAR | Status: AC
  Filled 2022-10-11: qty 2

## 2022-10-11 MED ORDER — LACTATED RINGERS IV SOLN: INTRAVENOUS | Status: DC

## 2022-10-11 MED ORDER — BUPIVACAINE HCL (PF) 0.5 % IJ SOLN
INTRAMUSCULAR | Status: AC
  Filled 2022-10-11: qty 30

## 2022-10-11 MED ORDER — DEXAMETHASONE SODIUM PHOSPHATE 10 MG/ML IJ SOLN
INTRAMUSCULAR | Status: DC | PRN
  Administered 2022-10-11: 4 mg via INTRAVENOUS

## 2022-10-11 MED ORDER — INSULIN GLARGINE-YFGN 100 UNIT/ML ~~LOC~~ SOLN
20.0000 [IU] | Freq: Two times a day (BID) | SUBCUTANEOUS | Status: DC
  Administered 2022-10-11 – 2022-10-12 (×2): 20 [IU] via SUBCUTANEOUS
  Filled 2022-10-11 (×4): qty 0.2

## 2022-10-11 MED ORDER — ACETAMINOPHEN 500 MG PO TABS
1000.0000 mg | ORAL_TABLET | Freq: Once | ORAL | Status: AC
  Administered 2022-10-11: 1000 mg via ORAL
  Filled 2022-10-11: qty 2

## 2022-10-11 MED ORDER — INSULIN ASPART 100 UNIT/ML IJ SOLN
0.0000 [IU] | Freq: Three times a day (TID) | INTRAMUSCULAR | Status: DC
  Administered 2022-10-11: 5 [IU] via SUBCUTANEOUS
  Administered 2022-10-12: 3 [IU] via SUBCUTANEOUS
  Administered 2022-10-12 (×2): 9 [IU] via SUBCUTANEOUS
  Administered 2022-10-13: 1 [IU] via SUBCUTANEOUS

## 2022-10-11 MED ORDER — CHLORHEXIDINE GLUCONATE 0.12 % MT SOLN
OROMUCOSAL | Status: AC
  Administered 2022-10-11: 15 mL via OROMUCOSAL
  Filled 2022-10-11: qty 15

## 2022-10-11 MED ORDER — FENTANYL CITRATE (PF) 250 MCG/5ML IJ SOLN
INTRAMUSCULAR | Status: DC | PRN
  Administered 2022-10-11: 25 ug via INTRAVENOUS

## 2022-10-11 MED ORDER — LIDOCAINE HCL (PF) 1 % IJ SOLN
INTRAMUSCULAR | Status: AC
  Filled 2022-10-11: qty 30

## 2022-10-11 MED ORDER — LIDOCAINE HCL 2 % IJ SOLN
INTRAMUSCULAR | Status: AC
  Filled 2022-10-11: qty 20

## 2022-10-11 MED ORDER — DEXTROSE 50 % IV SOLN
INTRAVENOUS | Status: AC
  Filled 2022-10-11: qty 50

## 2022-10-11 MED ORDER — LIDOCAINE 2% (20 MG/ML) 5 ML SYRINGE
INTRAMUSCULAR | Status: DC | PRN
  Administered 2022-10-11: 60 mg via INTRAVENOUS

## 2022-10-11 MED ORDER — DEXAMETHASONE SODIUM PHOSPHATE 10 MG/ML IJ SOLN
INTRAMUSCULAR | Status: AC
  Filled 2022-10-11: qty 1

## 2022-10-11 MED ORDER — INSULIN ASPART 100 UNIT/ML IJ SOLN
3.0000 [IU] | Freq: Three times a day (TID) | INTRAMUSCULAR | Status: DC
  Administered 2022-10-11 – 2022-10-12 (×3): 3 [IU] via SUBCUTANEOUS

## 2022-10-11 MED ORDER — PROPOFOL 10 MG/ML IV BOLUS
INTRAVENOUS | Status: DC | PRN
  Administered 2022-10-11: 150 mg via INTRAVENOUS

## 2022-10-11 MED ORDER — ORAL CARE MOUTH RINSE: 15.0000 mL | Freq: Once | OROMUCOSAL | Status: AC

## 2022-10-11 MED ORDER — INSULIN GLARGINE-YFGN 100 UNIT/ML ~~LOC~~ SOLN: 20.0000 [IU] | Freq: Two times a day (BID) | SUBCUTANEOUS | Status: DC

## 2022-10-11 SURGICAL SUPPLY — 37 items
BAG COUNTER SPONGE SURGICOUNT (BAG) ×1 IMPLANT
BLADE SURG 15 STRL LF DISP TIS (BLADE) IMPLANT
BLADE SURG 15 STRL SS (BLADE)
BNDG ELASTIC 4X5.8 VLCR STR LF (GAUZE/BANDAGES/DRESSINGS) IMPLANT
BNDG GAUZE DERMACEA FLUFF 4 (GAUZE/BANDAGES/DRESSINGS) ×1 IMPLANT
CHLORAPREP W/TINT 26 (MISCELLANEOUS) IMPLANT
COVER SURGICAL LIGHT HANDLE (MISCELLANEOUS) ×1 IMPLANT
DRSG EMULSION OIL 3X3 NADH (GAUZE/BANDAGES/DRESSINGS) IMPLANT
ELECT REM PT RETURN 9FT ADLT (ELECTROSURGICAL)
ELECTRODE REM PT RTRN 9FT ADLT (ELECTROSURGICAL) IMPLANT
GAUZE PAD ABD 8X10 STRL (GAUZE/BANDAGES/DRESSINGS) ×1 IMPLANT
GAUZE SPONGE 4X4 12PLY STRL (GAUZE/BANDAGES/DRESSINGS) ×1 IMPLANT
GAUZE XEROFORM 1X8 LF (GAUZE/BANDAGES/DRESSINGS) ×1 IMPLANT
GLOVE BIO SURGEON STRL SZ8 (GLOVE) ×1 IMPLANT
GLOVE BIOGEL PI IND STRL 8 (GLOVE) ×1 IMPLANT
GOWN STRL REUS W/ TWL LRG LVL3 (GOWN DISPOSABLE) ×2 IMPLANT
GOWN STRL REUS W/TWL LRG LVL3 (GOWN DISPOSABLE) ×2
KIT BASIN OR (CUSTOM PROCEDURE TRAY) ×1 IMPLANT
KIT TURNOVER KIT B (KITS) ×1 IMPLANT
NDL PRECISIONGLIDE 27X1.5 (NEEDLE) ×1 IMPLANT
NEEDLE PRECISIONGLIDE 27X1.5 (NEEDLE) ×1 IMPLANT
NS IRRIG 1000ML POUR BTL (IV SOLUTION) ×1 IMPLANT
PACK ORTHO EXTREMITY (CUSTOM PROCEDURE TRAY) ×1 IMPLANT
PAD ABD 8X10 STRL (GAUZE/BANDAGES/DRESSINGS) IMPLANT
PAD ARMBOARD 7.5X6 YLW CONV (MISCELLANEOUS) ×2 IMPLANT
PAD CAST 4YDX4 CTTN HI CHSV (CAST SUPPLIES) ×1 IMPLANT
PADDING CAST COTTON 4X4 STRL (CAST SUPPLIES) ×1
SOL PREP POV-IOD 4OZ 10% (MISCELLANEOUS) ×1 IMPLANT
STAPLER VISISTAT 35W (STAPLE) IMPLANT
SUT PROLENE 4 0 PS 2 18 (SUTURE) IMPLANT
SUT VIC AB 3-0 PS2 18 (SUTURE) IMPLANT
SUT VICRYL 4-0 PS2 18IN ABS (SUTURE) IMPLANT
SYR CONTROL 10ML LL (SYRINGE) ×1 IMPLANT
TOWEL GREEN STERILE (TOWEL DISPOSABLE) ×1 IMPLANT
TOWEL GREEN STERILE FF (TOWEL DISPOSABLE) ×1 IMPLANT
TUBE CONNECTING 12X1/4 (SUCTIONS) ×1 IMPLANT
YANKAUER SUCT BULB TIP NO VENT (SUCTIONS) IMPLANT

## 2022-10-11 NOTE — Anesthesia Procedure Notes (Signed)
Procedure Name: LMA Insertion Date/Time: 10/11/2022 12:02 PM  Performed by: Janene Harvey, CRNAPre-anesthesia Checklist: Patient identified, Emergency Drugs available, Suction available and Patient being monitored Patient Re-evaluated:Patient Re-evaluated prior to induction Oxygen Delivery Method: Circle system utilized Preoxygenation: Pre-oxygenation with 100% oxygen Induction Type: IV induction LMA: LMA inserted LMA Size: 3.0 Placement Confirmation: positive ETCO2 Dental Injury: Teeth and Oropharynx as per pre-operative assessment

## 2022-10-11 NOTE — Anesthesia Preprocedure Evaluation (Addendum)
Anesthesia Evaluation  Patient identified by MRN, date of birth, ID band Patient awake    Reviewed: Allergy & Precautions, H&P , NPO status , Patient's Chart, lab work & pertinent test results  Airway Mallampati: II  TM Distance: >3 FB Neck ROM: Full    Dental no notable dental hx. (+) Teeth Intact, Dental Advisory Given   Pulmonary neg pulmonary ROS   Pulmonary exam normal breath sounds clear to auscultation       Cardiovascular hypertension, + CAD, + Past MI and +CHF   Rhythm:Regular Rate:Normal     Neuro/Psych negative neurological ROS  negative psych ROS   GI/Hepatic negative GI ROS, Neg liver ROS,,,  Endo/Other  diabetes, Poorly Controlled, Insulin Dependent, Oral Hypoglycemic Agents  Morbid obesity  Renal/GU Renal InsufficiencyRenal disease  negative genitourinary   Musculoskeletal   Abdominal   Peds  Hematology  (+) Blood dyscrasia, anemia   Anesthesia Other Findings   Reproductive/Obstetrics negative OB ROS                             Anesthesia Physical Anesthesia Plan  ASA: 3  Anesthesia Plan: General   Post-op Pain Management: Tylenol PO (pre-op)*   Induction: Intravenous  PONV Risk Score and Plan: 4 or greater and Ondansetron, Midazolam and Treatment may vary due to age or medical condition  Airway Management Planned: LMA  Additional Equipment:   Intra-op Plan:   Post-operative Plan: Extubation in OR  Informed Consent: I have reviewed the patients History and Physical, chart, labs and discussed the procedure including the risks, benefits and alternatives for the proposed anesthesia with the patient or authorized representative who has indicated his/her understanding and acceptance.     Dental advisory given  Plan Discussed with: CRNA  Anesthesia Plan Comments:        Anesthesia Quick Evaluation

## 2022-10-11 NOTE — Plan of Care (Signed)

## 2022-10-11 NOTE — Progress Notes (Signed)
Progress Note   Patient: Abigail Moran [id number] DOB: 01-10-1976 DOA: 10/09/2022     2 DOS: the patient was seen and examined on 10/11/2022   Brief hospital course: Mrs. Gonzalez was admitted to the hospital with the working diagnosis of left diabetic foot infection.   47 yo female with the past medical history of T2DM, heart failure, hypertension, coronary artery disease, and dyslipidemia who presented with left toe pain for the last 2 days. Positive local pain at the left 5th toe, with drainage and discoloration. On her initial physical examination her blood pressure was 118/82, HR 93, RR 18 and 02 saturation 97%, lungs with no wheezing or rales, heart with S1 and S2 present and rhythmic, abdomen with no distention, no lower extremity edema. Left foot with left toe discoloration with local edema and drainage.   Na 131, K 4,0 Cl 99, bicarbonate 22, glucose 430, bun 28 cr 1,31 anion gap 10.  Lactic acid 1,3 and 1,5 Wbc 7,6 hgb 9,2 plt 259   Urine analysis with DG 1,025, protein 100, glucose >500, negative leukocytes.   04/04 plan for 5th toe amputation today.   Assessment and Plan: * Diabetic foot infection Left diabetic foot infection, 5th toe.  Left ankle radiograph with signs of osteomyelitis of distal left phalanx.  MRI left foot with fifth toe cellulitis and soft tissue ulcer with underlying osteomyelitis of the distal and middle phalanges and early osteomyelitis of the proximal phalanx. Adjacent plantar fluid collection measuring 0,8 x 0,4 x 0,8 cm that could represent and abscess.   Left ABI are normal.  Vascular surgery consultation with no vascular intervention needed at this point.   Plan to continue antibiotic therapy with Vancomycin, cefepime and metronidazole.  Continue main control with oral hydrocodone. Plan for surgical intervention, left 5th toe amputation today.   Primary hypertension Blood pressure has been stable, off antihypertensive medications, plan to continue  close follow up and blood pressure monitoring.   Uncontrolled type 1 diabetes mellitus with hyperglycemia Uncontrolled hyperglycemia.   Plan to increase basal insulin to 20 units bid. Because NPO status will give only 10 units this am. Added 3 units pre meal of short acting insulin.  Continue sliding scale, will decrease dose intensity and have close follow up on capillary glucose.   Dyslipidemia, continue with statin therapy.   CAD (coronary artery disease) No chest pain. Continue blood pressure monitoring.  CHF (congestive heart failure) No clinical signs of heart failure exacerbation.  Echocardiogram from 2016 with preserved LV systolic function.  Currently chronic diastolic heart failure with no exacerbation.   Hyperlipidemia Continue statin therapy.  Class 2 obesity Calculated BMI is 36.4        Subjective: Patient with no foot pain, no chest pain or dyspnea.   Physical Exam: Vitals:   10/10/22 1935 10/11/22 0414 10/11/22 0925 10/11/22 1051  BP: 122/83 129/86 115/77 121/87  Pulse: 98 99 95 95  Resp: 16 16 16 18   Temp: 98.8 F (37.1 C) 98.6 F (37 C) 98.7 F (37.1 C) 98.2 F (36.8 C)  TempSrc: Oral Oral Oral Oral  SpO2: 99% 100% 98% 99%  Weight:    99.8 kg  Height:    5\' 5"  (1.651 m)   Neurology awake and alert ENT with no pallor Cardiovascular with S1 and S2 present and rhythmic with no gallops, rubs or murmurs Respiratory with no rales or wheezing Abdomen with no distention  Left foot with dressing in place.  Data Reviewed:    Family  Communication: no family at the bedside   Disposition: Status is: Inpatient Remains inpatient appropriate because: IV antibiotics and per op care.   Planned Discharge Destination: Home   Author: Tawni Millers, MD 10/11/2022 12:05 PM  For on call review [email or web address].

## 2022-10-11 NOTE — Op Note (Signed)
Full Operative Report  Date of Operation: 6:56 AM, 10/11/2022   Patient: Abigail Moran - 47 y.o. female  Surgeon: Yevonne Pax, DPM   Assistant: None  Diagnosis: osteomyelitis  Procedure:  1. Amputation of 5th toe at metatarsal phalangeal joint level, left foot    Anesthesia: Anesthesia type not filed in the log.  No responsible provider has been recorded for the case.  No anesthesia staff entered.   Estimated Blood Loss: Minimal   Hemostasis: 1) Anatomical dissection, mechanical compression, electrocautery 2) no tourniquet was used  Implants: * No surgical log found *  Materials: prolene  Injectables: 1) Pre-operatively: 5 cc of 0.5% marcaine plain 2) Post-operatively: None  Specimens: 5th toe for pathology and deep wound culture 5th toe   Antibiotics: abx given as scheduled from the floor  Drains: None  Complications: Patient tolerated the procedure well without complication.   Findings: as below  Indications for Procedure: Abigail Moran presents to [employer], [name], DPM with a chief complaint of non healing ulcer with infection to the left 5th toe, concern for osteomyelitis of the distal and middle phalanx on MRI. The patient has failed conservative treatments of various modalities. At this time the patient has elected to proceed with surgical correction. All alternatives, risks, and complications of the procedures were thoroughly explained to the patient. Patient exhibits appropriate understanding of all discussion points and informed consent was signed and obtained in the chart with no guarantees to surgical outcome given or implied.  Description of Procedure: Patient was brought to the operating room and placed on the operative table in the supine position. Patient was secured to the table with safety belt, a contralateral SCD was placed, and all bony prominences were well padded. A surgical timeout was performed and all members of the  operating room, the procedure, and the surgical site were identified. Local with MAC anesthesia occurred. Local anesthetic as previously described was then injected about the operative field in a local infiltrative block.   The left lower extremity was then prepped and draped in the usual sterile manner.The following procedure then began.  Attention was directed to the 5th digit on the left foot. A full-thickness incision encompassing the entire digit was made using a #15 blade. There was 2-3 cc purulence expressed at that time. Dissection was carried down to bone. A deep tissue culture was obtained at this time.  The toe was secured with a towel clamp, further dissected in its entirety, and disarticulated at the metatarsal phalangeal joint and passed to the back table as a gross specimen. This was then labled and sent to pathology. The bone was noted to be soft and eroded, and consistent with osteomyelitis. All remaining necrotic and devitalized soft tissue structures were visualized and dissected away using sharp and dull dissection. Care was taken to protect all neurovascular structures throughout the dissection. Bleeding was noted to be minimal. The area was then flushed with copious amounts of sterile saline. Then using the suture materials previously described, the site was closed in anatomic layers and the skin was well approximated under minimal tension.  The surgical site was then dressed with betadine adaptic 4x4 kerlix ace.  The patient tolerated both the procedure and anesthesia well with vital signs stable throughout. The patient was transferred from the OR to recovery under the discretion of anesthesia.  Condition: Patient was taken to PACU in good condition and all vital signs stable and neurovascular status intact to the operative limb.  Discharge: Patient will  be readmitted to the floor for onging monitoring  [name], [name], DPM will follow the patient throughout the entire  post-operative course and the patient is aware of all post-operative protocols in place. The patient will follow the protocol of rest, ice, and elevation. The patient will be weightbearing  in a post op show to the operative limb until further instructed. The dressing is to remain clean, dry, and intact.   [name], DPM

## 2022-10-11 NOTE — Transfer of Care (Signed)
Immediate Anesthesia Transfer of Care Note  Patient: Abigail Moran  Procedure(s) Performed: AMPUTATION OF FIFTH TOE (Left: Toe)  Patient Location: PACU  Anesthesia Type:General  Level of Consciousness: drowsy and patient cooperative  Airway & Oxygen Therapy: Patient Spontanous Breathing and Patient connected to face mask oxygen  Post-op Assessment: Report given to RN and Post -op Vital signs reviewed and stable  Post vital signs: Reviewed and stable  Last Vitals:  Vitals Value Taken Time  BP 95/64 10/11/22 1234  Temp    Pulse 83 10/11/22 1236  Resp 15 10/11/22 1236  SpO2 99 % 10/11/22 1236  Vitals shown include unvalidated device data.  Last Pain:  Vitals:   10/11/22 1051  TempSrc: Oral  PainSc:          Complications: No notable events documented.

## 2022-10-11 NOTE — Anesthesia Postprocedure Evaluation (Signed)
Anesthesia Post Note  Patient: Abigail Moran  Procedure(s) Performed: AMPUTATION OF FIFTH TOE (Left: Toe)     Patient location during evaluation: PACU Anesthesia Type: General Level of consciousness: awake and alert Pain management: pain level controlled Vital Signs Assessment: post-procedure vital signs reviewed and stable Respiratory status: spontaneous breathing, nonlabored ventilation and respiratory function stable Cardiovascular status: blood pressure returned to baseline and stable Postop Assessment: no apparent nausea or vomiting Anesthetic complications: no  No notable events documented.  Last Vitals:  Vitals:   10/11/22 1300 10/11/22 1315  BP: 117/85 128/88  Pulse: 87 86  Resp: 14 12  Temp:  36.7 C  SpO2: 100% 99%    Last Pain:  Vitals:   10/11/22 1300  TempSrc:   PainSc: 0-No pain                 Jabar Krysiak,W. EDMOND

## 2022-10-11 NOTE — Progress Notes (Signed)
PODIATRY PROGRESS NOTE Patient Name: Abigail Moran  DOB 03-Oct-1975 DOA 10/09/2022  Hospital Day: 3  Assessment:  47 y.o. female with PMHx significant for medical history significant of diabetes w neuropathy and angiopathy, PAD, prior R 4th and 5th toe amp, hypertension, hyperlipidemia, CHF, CAD, gastroparesis, menorrhagia  with ulceration and gangrenous changes to the L 5th toe, concern for osteomyelitis of the L 5th toe.    AF VSS WBC 7.3 XR L foot: Soft tissue swelling at the ankle mortise and fifth distal phalanx. Cannot rule out osteomyelitis of the fifth distal phalanx. MRI L foot: Fifth toe cellulitis and soft tissue ulcer with underlying osteomyelitis of the distal and middle phalanges, and early osteomyelitis of the proximal phalanx. Adjacent plantar fluid collection measuring 0.8 x 0.4 x 0.8 cm, which could represent an abscess. ABI/PVR:    Plan:  - NPO for OR today at lunch for Left 5th toe amputation - Continue IV abx broad spectrum pending further culture data - Anticoagulation: per primary, hold pending OR - Wound care: betadine pain prn to toe - WB status: WBAT to LLE - Will continue to follow         Everitt Amber, DPM [facility]    Subjective:  Pt seen in pre op. Discussed MRI findings, aware of plans for Left fifth toe amputation today  Objective:   Vitals:   10/10/22 1935 10/11/22 0414  BP: 122/83 129/86  Pulse: 98 99  Resp: 16 16  Temp: 98.8 F (37.1 C) 98.6 F (37 C)  SpO2: 99% 100%       Latest Ref Rng & Units 10/11/2022   12:56 AM 10/09/2022   11:00 AM 03/19/2022   11:43 AM  CBC  WBC 4.0 - 10.5 K/uL 7.3  7.6  2.9   Hemoglobin 12.0 - 15.0 g/dL 8.8  9.2  9.9   Hematocrit 36.0 - 46.0 % 27.0  28.7  30.7   Platelets 150 - 400 K/uL 235  259  386.0        Latest Ref Rng & Units 10/11/2022   12:56 AM 10/09/2022   11:00 AM 03/19/2022   11:43 AM  BMP  Glucose 70 - 99 mg/dL 306  430  232   BUN 6 - 20 mg/dL 24  28  23     Creatinine 0.44 - 1.00 mg/dL 1.23  1.31  1.18   Sodium 135 - 145 mmol/L 132  131  134   Potassium 3.5 - 5.1 mmol/L 4.4  4.0  4.1   Chloride 98 - 111 mmol/L 100  99  101   CO2 22 - 32 mmol/L 19  22  23    Calcium 8.9 - 10.3 mg/dL 8.7  8.5  9.2     General: AAOx3, NAD  Lower Extremity Exam Vasc:     R - PT non palpable, DP palpable. Cap refill < 3 sec to digits               L - PT non palpable, DP palpable. Cap refill <3 sec to digits   Derm:    R - Normal temp/texture/turgor with no open lesion or clinical signs of infection               L - Ulceration to the distal lateral 5th toe, gangrenous changes with hyperkeratotic tissue, edema and mild pain of th L 5th toe         MSK:     R - Prior  R 4th and 5th toe amp               L -  No gross deformities. Compartments soft, non-tender, compressible   Neuro:   R - Gross sensation diminished. Gross motor function intact                 L - Gross sensation diminished. Gross motor function intact     Radiology:  Results reviewed. See assessment for pertinent imaging results

## 2022-10-12 ENCOUNTER — Encounter (HOSPITAL_COMMUNITY): Payer: Self-pay | Admitting: Podiatry

## 2022-10-12 ENCOUNTER — Encounter: Payer: Self-pay | Admitting: Hematology & Oncology

## 2022-10-12 DIAGNOSIS — M86172 Other acute osteomyelitis, left ankle and foot: Secondary | ICD-10-CM

## 2022-10-12 LAB — CBC
HCT: 28.6 % — ABNORMAL LOW (ref 36.0–46.0)
Hemoglobin: 8.9 g/dL — ABNORMAL LOW (ref 12.0–15.0)
MCH: 28 pg (ref 26.0–34.0)
MCHC: 31.1 g/dL (ref 30.0–36.0)
MCV: 89.9 fL (ref 80.0–100.0)
Platelets: 243 10*3/uL (ref 150–400)
RBC: 3.18 MIL/uL — ABNORMAL LOW (ref 3.87–5.11)
RDW: 13.4 % (ref 11.5–15.5)
WBC: 6.7 10*3/uL (ref 4.0–10.5)
nRBC: 0 % (ref 0.0–0.2)

## 2022-10-12 LAB — BASIC METABOLIC PANEL
Anion gap: 10 (ref 5–15)
BUN: 28 mg/dL — ABNORMAL HIGH (ref 6–20)
CO2: 18 mmol/L — ABNORMAL LOW (ref 22–32)
Calcium: 8.4 mg/dL — ABNORMAL LOW (ref 8.9–10.3)
Chloride: 99 mmol/L (ref 98–111)
Creatinine, Ser: 1.28 mg/dL — ABNORMAL HIGH (ref 0.44–1.00)
GFR, Estimated: 52 mL/min — ABNORMAL LOW (ref >60)
Glucose, Bld: 476 mg/dL — ABNORMAL HIGH (ref 70–99)
Potassium: 4.3 mmol/L (ref 3.5–5.1)
Sodium: 127 mmol/L — ABNORMAL LOW (ref 135–145)

## 2022-10-12 LAB — HEMOGLOBIN A1C
Hgb A1c MFr Bld: 9.1 % — ABNORMAL HIGH (ref 4.8–5.6)
Mean Plasma Glucose: 214 mg/dL

## 2022-10-12 LAB — GLUCOSE, CAPILLARY
Glucose-Capillary: 389 mg/dL — ABNORMAL HIGH (ref 70–99)
Glucose-Capillary: 359 mg/dL — ABNORMAL HIGH (ref 70–99)
Glucose-Capillary: 231 mg/dL — ABNORMAL HIGH (ref 70–99)
Glucose-Capillary: 168 mg/dL — ABNORMAL HIGH (ref 70–99)

## 2022-10-12 LAB — SURGICAL PATHOLOGY

## 2022-10-12 MED ORDER — INSULIN ASPART 100 UNIT/ML IJ SOLN
5.0000 [IU] | Freq: Three times a day (TID) | INTRAMUSCULAR | Status: DC
  Administered 2022-10-12 – 2022-10-13 (×2): 5 [IU] via SUBCUTANEOUS

## 2022-10-12 MED ORDER — INSULIN GLARGINE-YFGN 100 UNIT/ML ~~LOC~~ SOLN
22.0000 [IU] | Freq: Two times a day (BID) | SUBCUTANEOUS | Status: DC
  Administered 2022-10-12 – 2022-10-13 (×2): 22 [IU] via SUBCUTANEOUS
  Filled 2022-10-12 (×3): qty 0.22

## 2022-10-12 NOTE — Plan of Care (Signed)

## 2022-10-12 NOTE — Inpatient Diabetes Management (Signed)
Inpatient Diabetes Program Recommendations  AACE/ADA: New Consensus Statement on Inpatient Glycemic Control (2015)  Target Ranges:  Prepandial:   less than 140 mg/dL      Peak postprandial:   less than 180 mg/dL (1-2 hours)      Critically ill patients:  140 - 180 mg/dL   Lab Results  Component Value Date   GLUCAP 359 (H) 10/12/2022   HGBA1C 9.1 (H) 10/09/2022    Review of Glycemic Control  Diabetes history: DM1 Outpatient Diabetes medications: NPH 20 BID, Novolin R 5-8 units TID Current orders for Inpatient glycemic control: Semglee 20 BID, Novolog 0-9 units TID + 3 units TID  CBGs today 389, 359  Inpatient Diabetes Program Recommendations:    Increase Semglee to 22 units BID  Increase Novolog to 5 units TID with meals if eating > 50%.  Continue to follow trends.  Thank you. Ailene Ards, RD, LDN, CDCES Inpatient Diabetes Coordinator [phone]

## 2022-10-12 NOTE — Progress Notes (Signed)
Progress Note   Patient: Abigail Moran WIO:[id number] DOB: Apr 28, 1976 DOA: 10/09/2022     3 DOS: the patient was seen and examined on 10/12/2022   Brief hospital course: Abigail Moran was admitted to the hospital with the working diagnosis of left diabetic foot infection.   47 yo female with the past medical history of T2DM, heart failure, hypertension, coronary artery disease, and dyslipidemia who presented with left toe pain for the last 2 days. Positive local pain at the left 5th toe, with drainage and discoloration. On her initial physical examination her blood pressure was 118/82, HR 93, RR 18 and 02 saturation 97%, lungs with no wheezing or rales, heart with S1 and S2 present and rhythmic, abdomen with no distention, no lower extremity edema. Left foot with left toe discoloration with local edema and drainage.   Na 131, K 4,0 Cl 99, bicarbonate 22, glucose 430, bun 28 cr 1,31 anion gap 10.  Lactic acid 1,3 and 1,5 Wbc 7,6 hgb 9,2 plt 259   Urine analysis with DG 1,025, protein 100, glucose >500, negative leukocytes.   04/04 plan for 5th toe amputation today.  04/05 post op amputation of 5th toe left foot with no complications.  Patient with hyperglycemia.   Assessment and Plan: * Diabetic foot infection Left diabetic foot infection, 5th toe.  Left ankle radiograph with signs of osteomyelitis of distal left phalanx.  MRI left foot with fifth toe cellulitis and soft tissue ulcer with underlying osteomyelitis of the distal and middle phalanges and early osteomyelitis of the proximal phalanx. Adjacent plantar fluid collection measuring 0,8 x 0,4 x 0,8 cm that could represent and abscess.   Left ABI are normal.  Vascular surgery consultation with no vascular intervention needed at this point.   04/04 left foot amputation of 5th toe at metatarsal phalangeal joint level.   Continue antibiotic therapy with Vancomycin, cefepime and metronidazole, until further recommendations from surgical  team.  Continue main control with oral hydrocodone. Continue pos op care per podiatry.   Primary hypertension Systolic blood pressure 120 to 130 mmHg.  Patient at home not on antihypertensive medications, will continue close monitoring for now.   Uncontrolled type 1 diabetes mellitus with hyperglycemia Uncontrolled hyperglycemia.   Increase basal insulin to 22 units bid and to 5 units pre meal of short acting insulin.  Continue sliding scale, will decrease dose intensity and have close follow up on capillary glucose.   Dyslipidemia, continue with statin therapy.   CAD (coronary artery disease) No chest pain. Continue blood pressure monitoring.  CHF (congestive heart failure) No clinical signs of heart failure exacerbation.  Echocardiogram from 2016 with preserved LV systolic function.  Currently chronic diastolic heart failure with no exacerbation.   Hyperlipidemia Continue statin therapy.  Class 2 obesity Calculated BMI is 36.4        Subjective: Patient is feeling well, no significant pain at the surgical site, no chest pain or dyspnea   Physical Exam: Vitals:   10/11/22 1957 10/12/22 0355 10/12/22 0800 10/12/22 0810  BP: 123/82 128/61 (!) 123/100 (!) 136/90  Pulse: 97 96    Resp: 16 16 17    Temp: 97.9 F (36.6 C) 98.3 F (36.8 C) 98.9 F (37.2 C)   TempSrc: Oral Oral Oral   SpO2: 99% 99% 100%   Weight:      Height:       Neurology awake and alert ENT with mild pallor Cardiovascular with S1 and S2 present and rhythmic with no gallops, rubs or  murmurs Respiratory with no rales or wheezing Abdomen with no distention  Left foot with dressing in place  Data Reviewed:    Family Communication: no family at the bedside   Disposition: Status is: Inpatient Remains inpatient appropriate because: post op care  Planned Discharge Destination: Home    Author: Coralie Keens, MD 10/12/2022 3:11 PM  For on call review [email or web address].

## 2022-10-12 NOTE — Progress Notes (Addendum)
PODIATRY PROGRESS NOTE Patient Name: Abigail Moran  DOB Jun 27, 1976 DOA 10/09/2022  Hospital Day: 4  Assessment:  47 y.o. female with PMHx significant for medical history significant of diabetes w neuropathy and angiopathy, PAD, prior R 4th and 5th toe amp, hypertension, hyperlipidemia, CHF, CAD, gastroparesis, menorrhagia  with ulceration and gangrenous changes to the L 5th toe, concern for osteomyelitis of the L 5th toe.    POD1 s/p L 5th toe amputation, progressing well no issues  AF VSS WBC 7.3 XR L foot: Soft tissue swelling at the ankle mortise and fifth distal phalanx. Cannot rule out osteomyelitis of the fifth distal phalanx. MRI L foot: Fifth toe cellulitis and soft tissue ulcer with underlying osteomyelitis of the distal and middle phalanges, and early osteomyelitis of the proximal phalanx. Adjacent plantar fluid collection measuring 0.8 x 0.4 x 0.8 cm, which could represent an abscess.   Plan:  - S/p L 5th toe amputation. Healing well. Amputation site healthy without sings of residual infection - Ok with narrowing abx per culture rec ID consult for PO abx recs for dC likely need multiple given GPC and GPR - Anticoagulation: per primary ok to resume - Wound care: Dressing changed today. Leave intact until follow up next week in [place] office Wed or Thurs - WB status: WBAT to LLE in post op shoe - ordered - Patient stable for discharge from my standpoint, hopefull tmrw pending Abx plan / cultures         Corinna Gab, DPM [facility]    Subjective:  Pt seen bedside, denies pain, aware of findings from OR and follow up / discharge instructions  Objective:   Vitals:   10/12/22 0810 10/12/22 1700  BP: (!) 136/90 106/73  Pulse:  92  Resp:  16  Temp:  98.5 F (36.9 C)  SpO2:  100%       Latest Ref Rng & Units 10/12/2022    4:00 AM 10/11/2022   12:56 AM 10/09/2022   11:00 AM  CBC  WBC 4.0 - 10.5 K/uL 6.7  7.3  7.6   Hemoglobin 12.0 - 15.0 g/dL 8.9   8.8  9.2   Hematocrit 36.0 - 46.0 % 28.6  27.0  28.7   Platelets 150 - 400 K/uL 243  235  259        Latest Ref Rng & Units 10/12/2022    4:00 AM 10/11/2022   12:56 AM 10/09/2022   11:00 AM  BMP  Glucose 70 - 99 mg/dL 409  811  914   BUN 6 - 20 mg/dL 28  24  28    Creatinine 0.44 - 1.00 mg/dL 7.82  9.56  2.13   Sodium 135 - 145 mmol/L 127  132  131   Potassium 3.5 - 5.1 mmol/L 4.3  4.4  4.0   Chloride 98 - 111 mmol/L 99  100  99   CO2 22 - 32 mmol/L 18  19  22    Calcium 8.9 - 10.3 mg/dL 8.4  8.7  8.5     General: AAOx3, NAD  Lower Extremity Exam Vasc:     R - PT non palpable, DP palpable. Cap refill < 3 sec to digits               L - PT non palpable, DP palpable. Cap refill <3 sec to digits   Derm:    R - Normal temp/texture/turgor with no open lesion or clinical signs of infection  L - Amputation site healing well with minimal eschar, no dehisence or residual infection no erythema or drainage         MSK:     R - Prior R 4th and 5th toe amp               L -   S/p L 5th toe amputation   Neuro:   R - Gross sensation diminished. Gross motor function intact                 L - Gross sensation diminished. Gross motor function intact     Radiology:  Results reviewed. See assessment for pertinent imaging results

## 2022-10-12 NOTE — Progress Notes (Signed)
Orthopedic Tech Progress Note Patient Details:  Abigail Moran Apr 23, 1976 [id number]  Ortho Devices Type of Ortho Device: Postop shoe/boot Ortho Device/Splint Interventions: Ordered   Post Interventions Instructions Provided: Care of device, Adjustment of device  Abigail Moran 10/12/2022, 7:15 PM

## 2022-10-13 LAB — BASIC METABOLIC PANEL
Anion gap: 7 (ref 5–15)
BUN: 28 mg/dL — ABNORMAL HIGH (ref 6–20)
CO2: 20 mmol/L — ABNORMAL LOW (ref 22–32)
Calcium: 8.3 mg/dL — ABNORMAL LOW (ref 8.9–10.3)
Chloride: 106 mmol/L (ref 98–111)
Creatinine, Ser: 1.08 mg/dL — ABNORMAL HIGH (ref 0.44–1.00)
GFR, Estimated: >60 mL/min (ref >60)
Glucose, Bld: 217 mg/dL — ABNORMAL HIGH (ref 70–99)
Potassium: 3.8 mmol/L (ref 3.5–5.1)
Sodium: 133 mmol/L — ABNORMAL LOW (ref 135–145)

## 2022-10-13 LAB — GLUCOSE, CAPILLARY
Glucose-Capillary: 135 mg/dL — ABNORMAL HIGH (ref 70–99)
Glucose-Capillary: 106 mg/dL — ABNORMAL HIGH (ref 70–99)

## 2022-10-13 MED ORDER — LINEZOLID 600 MG PO TABS: 600.0000 mg | ORAL_TABLET | Freq: Two times a day (BID) | ORAL | 0 refills | Status: DC

## 2022-10-13 MED ORDER — LINEZOLID 600 MG PO TABS: 600.0000 mg | ORAL_TABLET | Freq: Two times a day (BID) | ORAL | 0 refills | Status: AC

## 2022-10-13 MED ORDER — ACETAMINOPHEN 325 MG PO TABS: 650.0000 mg | ORAL_TABLET | Freq: Four times a day (QID) | ORAL | Status: DC | PRN

## 2022-10-13 MED ORDER — LINEZOLID 600 MG PO TABS
600.0000 mg | ORAL_TABLET | Freq: Two times a day (BID) | ORAL | Status: DC
  Administered 2022-10-13: 600 mg via ORAL
  Filled 2022-10-13 (×2): qty 1

## 2022-10-13 NOTE — Discharge Summary (Addendum)
Physician Discharge Summary   Patient: Abigail Moran MRN: [id number] DOB: Aug 10, 1975  Admit date:     10/09/2022  Discharge date: 10/13/22  Discharge Physician: Abigail Moran   PCP: Abigail Craze, NP   Recommendations at discharge:    Patient will continue antibiotic therapy with linezolid 600 mg bid for 1 month, with follow up with ID clinic as outpatient in 3 weeks.  wound dressing to leave intact until follow up next week in [place] office Wed or Thurs - WB status: WBAT to LLE in post op shoe - ordered.   Discharge Diagnoses: Principal Problem:   Diabetic foot infection Active Problems:   Primary hypertension   Uncontrolled type 1 diabetes mellitus with hyperglycemia   CAD (coronary artery disease)   CHF (congestive heart failure)   Hyperlipidemia   Class 2 obesity   Osteomyelitis of left foot  Resolved Problems:   * No resolved hospital problems. [facility] Course: [name] was admitted to the hospital with the working diagnosis of left diabetic foot infection.   47 yo female with the past medical history of T2DM, heart failure, hypertension, coronary artery disease, and dyslipidemia who presented with left toe pain for the last 2 days. Positive local pain at the left 5th toe, with drainage and discoloration. On her initial physical examination her blood pressure was 118/82, HR 93, RR 18 and 02 saturation 97%, lungs with no wheezing or rales, heart with S1 and S2 present and rhythmic, abdomen with no distention, no lower extremity edema. Left foot with left toe discoloration with local edema and drainage.   Na 131, K 4,0 Cl 99, bicarbonate 22, glucose 430, bun 28 cr 1,31 anion gap 10.  Lactic acid 1,3 and 1,5 Wbc 7,6 hgb 9,2 plt 259   Urine analysis with DG 1,025, protein 100, glucose >500, negative leukocytes.   04/04 plan for 5th toe amputation today.  04/05 post op amputation of 5th toe left foot with no complications.  Patient with hyperglycemia.    Assessment and Plan: * Diabetic foot infection Left diabetic foot infection, 5th toe. (No sepsis).  Left ankle radiograph with signs of osteomyelitis of distal left phalanx.  MRI left foot with fifth toe cellulitis and soft tissue ulcer with underlying osteomyelitis of the distal and middle phalanges and early osteomyelitis of the proximal phalanx. Adjacent plantar fluid collection measuring 0,8 x 0,4 x 0,8 cm that could represent and abscess.   Left ABI are normal.  Vascular surgery consultation with no vascular intervention needed at this point.   04/04 left foot amputation of 5th toe at metatarsal phalangeal joint level.   Patient was treated with IV antibiotic therapy during her hospitalization with vancomycin, cefepime and metronidazole, OR cultures positive for gram positive cocci and gram positive rods, it has been re incubated for better growth. She has remained afebrile and her discharge wbc is 6.7   Case discussed with ID, Dr, Daiva Eves will transition to oral linezoild  for 1 month and follow up with ID as outpatient.  Continue post op care per podiatry, weight bearing as tolerated with post op shoe.   Primary hypertension At the time of her discharge her blood pressure has been 116 to 120 mmHg. Plan to follow up as outpatient.   Uncontrolled type 1 diabetes mellitus with hyperglycemia Uncontrolled hyperglycemia.   Patient was placed on basal insulin, pre meal short acting insulin, along with insulin sliding scale. At the time of her discharge her fasting glucose is 217  mg/dl.  Patient will resume her home insulin regimen at the time of her discharge.   Noted pseudohyponatremia due to hyperglycemia.   CAD (coronary artery disease) No chest pain. Continue blood pressure monitoring.  CHF (congestive heart failure) No clinical signs of heart failure exacerbation.  Echocardiogram from 2016 with preserved LV systolic function.  Currently chronic diastolic heart failure  with no exacerbation.   Hyperlipidemia Continue statin therapy.  Class 2 obesity Calculated BMI is 36.4         Consultants: podiatry, vascular surgery, ID (over the phone).  Procedures performed:  Amputation of 5th toe at metatarsal phalangeal joint level, left foot  Disposition: Home Diet recommendation:  Cardiac and Carb modified diet DISCHARGE MEDICATION: Allergies as of 10/13/2022       Reactions   Latex Itching   Tears skin   Tape Hives        Medication List     STOP taking these medications    cephALEXin 500 MG capsule Commonly known as: KEFLEX   ibuprofen 200 MG tablet Commonly known as: ADVIL   oxyCODONE-acetaminophen 5-325 MG tablet Commonly known as: PERCOCET/ROXICET   pravastatin 40 MG tablet Commonly known as: PRAVACHOL       TAKE these medications    Accu-Chek Guide test strip Generic drug: glucose blood USE FOUR TIMES DAILY   Accu-Chek Guide w/Device Kit Use 4 times Daily   Accu-Chek Softclix Lancets lancets USE FOUR TIMES DAILY   acetaminophen 325 MG tablet Commonly known as: TYLENOL Take 2 tablets (650 mg total) by mouth every 6 (six) hours as needed for moderate pain.   blood glucose meter kit and supplies Kit Dispense based on patient and insurance preference. Use up to four times daily as directed. E11.9   HumuLIN N 100 UNIT/ML injection Generic drug: insulin NPH Human Inject 0.2 mLs (20 Units total) into the skin 2 (two) times daily before a meal.   Iron (Ferrous Sulfate) 325 (65 Fe) MG Tabs Take 325 mg by mouth every other day. What changed: when to take this   linezolid 600 MG tablet Commonly known as: ZYVOX Take 1 tablet (600 mg total) by mouth every 12 (twelve) hours.   multivitamin tablet Take 1 tablet by mouth daily.   NovoLIN R 100 units/mL injection Generic drug: insulin regular Inject 5-8 Units  into the skin 3 (three) times daily before meals.   UltiCare Insulin Syringe 30G X 1/2" 0.5 ML  Misc Generic drug: Insulin Syringe-Needle U-100 Use up to 4 times daily   Vitamin D (Ergocalciferol) 1.25 MG (50000 UNIT) Caps capsule Commonly known as: DRISDOL Take 1 capsule (50,000 Units total) by mouth every 7 (seven) days.        Discharge Exam: Filed Weights   10/09/22 0935 10/09/22 1715 10/11/22 1051  Weight: 102.1 kg 101 kg 99.8 kg   BP 120/86 (BP Location: Right Arm)   Pulse 92   Temp 98.7 F (37.1 C) (Oral)   Resp 18   Ht 5\' 5"  (1.651 m)   Wt 99.8 kg   LMP 09/02/2022 (Approximate)   SpO2 100%   BMI 36.61 kg/m   Patient is feeling well, no significant pain at the surgical site, no nausea or vomiting,.  Neurology awake and alert ENT with no pallor Cardiovascular with S1 and S2 present and rhythmic Respiratory with no rales Abdomen with no distention  No lower extremity edema, left foot with dressing in place.   Condition at discharge: stable  The results of significant  diagnostics from this hospitalization (including imaging, microbiology, ancillary and laboratory) are listed below for reference.   Imaging Studies: DG Foot 2 Views Left  Result Date: 10/11/2022 CLINICAL DATA:  Post amputation. EXAM: LEFT FOOT - 2 VIEW COMPARISON:  MRI left foot from yesterday. Left foot x-rays dated October 09, 2022. FINDINGS: Postsurgical changes from interval fifth toe amputation. No acute fracture or dislocation. Joint spaces are preserved. Bone mineralization is normal. Mild diffuse forefoot soft tissue swelling. IMPRESSION: 1. Postsurgical changes from interval fifth toe amputation. Electronically Signed   By: Obie DredgeWilliam T Derry M.D.   On: 10/11/2022 13:38   VAS US ABI WITH/WO TBI  Result Date: 10/10/2022  LOWER EXTREMITY DOPPLER STUDY Patient Name:  Estell HarpinAMARAH Saraceni  Date of Exam:   10/10/2022 Medical Rec #: [id number]       Accession #:    [id number] Date of Birth: 04/05/1976        Patient Gender: F Patient Age:   [age] years [facility] Procedure:      VAS US  ABI WITH/WO TBI Referring Phys: Lyn HollingsheadALEXANDER MELVIN --------------------------------------------------------------------------------  Indications: Ulceration. Left toe. S/P RT fourth and fifth toe amputation High Risk         Hypertension, hyperlipidemia, Diabetes, coronary artery Factors:          disease.  Comparison Study: Prior ABI 02-26-2022 Performing Technologist: Jean RosenthalHodge, Rachel RDMS RVT  Examination Guidelines: A complete evaluation includes at minimum, Doppler waveform signals and systolic blood pressure reading at the level of bilateral brachial, anterior tibial, and posterior tibial arteries, when vessel segments are accessible. Bilateral testing is considered an integral part of a complete examination. Photoelectric Plethysmograph (PPG) waveforms and toe systolic pressure readings are included as required and additional duplex testing as needed. Limited examinations for reoccurring indications may be performed as noted.  ABI Findings: +---------+------------------+-----+---------+--------+ Right    Rt Pressure (mmHg)IndexWaveform Comment  +---------+------------------+-----+---------+--------+ Brachial 121                    triphasic         +---------+------------------+-----+---------+--------+ PTA      127               1.05 triphasic         +---------+------------------+-----+---------+--------+ DP       129               1.07 triphasic         +---------+------------------+-----+---------+--------+ Great Toe80                0.66 Abnormal          +---------+------------------+-----+---------+--------+ +---------+------------------+-----+---------+-------+ Left     Lt Pressure (mmHg)IndexWaveform Comment +---------+------------------+-----+---------+-------+ Brachial 116                    triphasic        +---------+------------------+-----+---------+-------+ PTA      125               1.03 triphasic         +---------+------------------+-----+---------+-------+ DP       125               1.03 triphasic        +---------+------------------+-----+---------+-------+ Great Toe82                0.68 Abnormal         +---------+------------------+-----+---------+-------+ +-------+-----------+-----------+------------+------------+ ABI/TBIToday's ABIToday's TBIPrevious ABIPrevious TBI +-------+-----------+-----------+------------+------------+ Right  1.07  0.66       1.13        0.60         +-------+-----------+-----------+------------+------------+ Left   1.03       0.68       1.17        0.63         +-------+-----------+-----------+------------+------------+  Right ABIs appear essentially unchanged compared to prior study on 02-26-2022. Left ABIs appear essentially unchanged compared to prior study on 02-26-2022.  Summary: Right: Resting right ankle-brachial index is within normal range. The right toe-brachial index is abnormal. Left: Resting left ankle-brachial index is within normal range. The left toe-brachial index is abnormal. *See table(s) above for measurements and observations.  Electronically signed by Heath Lark on 10/10/2022 at 9:59:32 PM.    Final    MR FOOT LEFT WO CONTRAST  Result Date: 10/10/2022 CLINICAL DATA:  Eval for 5th toe OM EXAM: MRI OF THE LEFT FOOT WITHOUT CONTRAST TECHNIQUE: Multiplanar, multisequence MR imaging of the left forefoot was performed. No intravenous contrast was administered. COMPARISON:  Left foot radiograph 10/09/2022 FINDINGS: Bones/Joint/Cartilage There is marrow edema and low T1 signal within the fifth toe distal phalanx and middle phalanx. There is marrow edema in the proximal phalanges. Ligaments Intact Lisfranc ligament. Muscles and Tendons Chronic muscle denervation changes.  No acute tendon tear. Soft tissues Diffuse soft tissue swelling of the foot with skin thickening soft tissue swelling of the fifth toe and adjacent fifth toe ulcer.  There is a plantar fluid collection measuring 0.8 x 0.4 x 0.8 cm (axial T2 image 17, sagittal STIR image 25). IMPRESSION: Fifth toe cellulitis and soft tissue ulcer with underlying osteomyelitis of the distal and middle phalanges, and early osteomyelitis of the proximal phalanx. Adjacent plantar fluid collection measuring 0.8 x 0.4 x 0.8 cm, which could represent an abscess. Electronically Signed   By: Caprice Renshaw M.D.   On: 10/10/2022 15:25   DG Foot Complete Left  Result Date: 10/09/2022 CLINICAL DATA:  left fifth toe pain/drainage EXAM: LEFT FOOT - COMPLETE 3 VIEW; LEFT ANKLE COMPLETE - 3 VIEW COMPARISON:  None Available. FINDINGS: There is no evidence of fracture, dislocation, or subluxation. There is no evidence of arthropathy or other focal bone abnormality. Soft tissue swelling noted at the ankle mortise. Fifth distal phalanx soft tissue swelling identified with suggestion of a defect consistent with a wound. Fifth distal phalanx is thin and sclerotic in the possibility of osteomyelitis cannot be excluded. IMPRESSION: Soft tissue swelling at the ankle mortise and fifth distal phalanx. Cannot rule out osteomyelitis of the fifth distal phalanx. Electronically Signed   By: Layla Maw M.D.   On: 10/09/2022 10:47   DG Ankle Complete Left  Result Date: 10/09/2022 CLINICAL DATA:  left fifth toe pain/drainage EXAM: LEFT FOOT - COMPLETE 3 VIEW; LEFT ANKLE COMPLETE - 3 VIEW COMPARISON:  None Available. FINDINGS: There is no evidence of fracture, dislocation, or subluxation. There is no evidence of arthropathy or other focal bone abnormality. Soft tissue swelling noted at the ankle mortise. Fifth distal phalanx soft tissue swelling identified with suggestion of a defect consistent with a wound. Fifth distal phalanx is thin and sclerotic in the possibility of osteomyelitis cannot be excluded. IMPRESSION: Soft tissue swelling at the ankle mortise and fifth distal phalanx. Cannot rule out osteomyelitis of the  fifth distal phalanx. Electronically Signed   By: Layla Maw M.D.   On: 10/09/2022 10:47    Microbiology: Results for orders placed or performed during  the hospital encounter of 10/09/22  Culture, blood (routine x 2)     Status: None (Preliminary result)   Collection Time: 10/09/22 10:59 AM   Specimen: BLOOD  Result Value Ref Range Status   Specimen Description   Final    BLOOD LEFT ANTECUBITAL Performed at [facility], [address] [place], [place] [address]    Special Requests   Final    BOTTLES DRAWN AEROBIC AND ANAEROBIC Blood Culture results may not be optimal due to an excessive volume of blood received in culture bottles Performed at [facility], [address] [place], [place] [address]    Culture   Final    NO GROWTH 3 DAYS Performed at [facility], [address], [place], [place] [address]    Report Status PENDING  Incomplete  Culture, blood (routine x 2)     Status: None (Preliminary result)   Collection Time: 10/09/22  6:53 PM   Specimen: BLOOD LEFT ARM  Result Value Ref Range Status   Specimen Description BLOOD LEFT ARM  Final   Special Requests   Final    BOTTLES DRAWN AEROBIC ONLY Blood Culture results may not be optimal due to an inadequate volume of blood received in culture bottles   Culture   Final    NO GROWTH 3 DAYS Performed at [facility], [address] [place], [place] [address]    Report Status PENDING  Incomplete  Aerobic/Anaerobic Culture w Gram Stain (surgical/deep wound)     Status: None (Preliminary result)   Collection Time: 10/11/22 12:31 PM   Specimen: PATH Other; Tissue  Result Value Ref Range Status   Specimen Description TOE  Final   Special Requests NONE  Final   Gram Stain   Final    NO WBC SEEN RARE GRAM POSITIVE COCCI RARE GRAM POSITIVE RODS    Culture   Final    CULTURE REINCUBATED FOR BETTER GROWTH Performed at [facility], [address], [place], [place] [address]     Report Status PENDING  Incomplete    Labs: CBC: Recent Labs  Lab 10/09/22 1100 10/11/22 0056 10/12/22 0400  WBC 7.6 7.3 6.7  NEUTROABS 6.0 5.4  --   HGB 9.2* 8.8* 8.9*  HCT 28.7* 27.0* 28.6*  MCV 88.3 88.5 89.9  PLT 259 235 243   Basic Metabolic Panel: Recent Labs  Lab 10/09/22 1100 10/11/22 0056 10/12/22 0400 10/13/22 0253  NA 131* 132* 127* 133*  K 4.0 4.4 4.3 3.8  CL 99 100 99 106  CO2 22 19* 18* 20*  GLUCOSE 430* 306* 476* 217*  BUN 28* 24* 28* 28*  CREATININE 1.31* 1.23* 1.28* 1.08*  CALCIUM 8.5* 8.7* 8.4* 8.3*   Liver Function Tests: No results for input(s): "AST", "ALT", "ALKPHOS", "BILITOT", "PROT", "ALBUMIN" in the last 168 hours. CBG: Recent Labs  Lab 10/12/22 0805 10/12/22 1351 10/12/22 1706 10/12/22 2030 10/13/22 0719  GLUCAP 389* 359* 231* 168* 135*    Discharge time spent: greater than 30 minutes.  Signed: Coralie Keens, MD [facility] 10/13/2022

## 2022-10-13 NOTE — TOC Transition Note (Signed)
Transition of Care [facility]) - CM/SW Discharge Note   Patient Details  Name: Abigail Moran MRN: [id number] Date of Birth: [birth year]  Transition of Care [facility] CM/SW Contact:  Lawerance Sabal, RN Phone Number: 10/13/2022, 9:49 AM   Clinical Narrative:     Sherron Monday w patient at bedside. Provided with MATCH, overide for abx, pharmacy updated by MD to participating location, discussed w patient. No other TCO needs identified.   Final next level of care: Home/Self Care Barriers to Discharge: No Barriers Identified   Patient Goals and CMS Choice      Discharge Placement                         Discharge Plan and Services Additional resources added to the After Visit Summary for                                       Social Determinants of Health (SDOH) Interventions SDOH Screenings   Food Insecurity: No Food Insecurity (10/09/2022)  Housing: Low Risk  (10/09/2022)  Transportation Needs: No Transportation Needs (10/09/2022)  Utilities: Not At Risk (10/09/2022)  Depression (PHQ2-9): Low Risk  (03/31/2021)  Tobacco Use: Low Risk  (10/12/2022)     Readmission Risk Interventions    03/06/2022    2:06 PM  Readmission Risk Prevention Plan  Transportation Screening Complete  Home Care Screening Complete  Medication Review (RN CM) Complete

## 2022-10-14 ENCOUNTER — Telehealth: Payer: Self-pay | Admitting: Infectious Disease

## 2022-10-14 LAB — CULTURE, BLOOD (ROUTINE X 2)
Culture: NO GROWTH
Culture: NO GROWTH

## 2022-10-14 NOTE — Telephone Encounter (Signed)
This patient needs to be seen as a new patient in clinic I have scheduled her on 22 April with Dr. Thedore Mins.  She has a history of diabetic foot infection with osteomyelitis status post amputation she is on Zyvox currently.  We inform her of her appointment and make sure this will work for her.

## 2022-10-15 ENCOUNTER — Encounter: Payer: Self-pay | Admitting: Hematology & Oncology

## 2022-10-15 ENCOUNTER — Other Ambulatory Visit: Payer: Self-pay | Admitting: Infectious Disease

## 2022-10-15 ENCOUNTER — Other Ambulatory Visit (HOSPITAL_COMMUNITY): Payer: Self-pay

## 2022-10-15 ENCOUNTER — Telehealth: Payer: Self-pay

## 2022-10-15 MED ORDER — DOXYCYCLINE HYCLATE 100 MG PO CAPS: 100.0000 mg | ORAL_CAPSULE | Freq: Two times a day (BID) | ORAL | 0 refills | Status: AC

## 2022-10-15 MED ORDER — LINEZOLID 600 MG PO TABS
600.0000 mg | ORAL_TABLET | Freq: Two times a day (BID) | ORAL | 0 refills | Status: DC
  Filled 2022-10-15: qty 60, 30d supply, fill #0

## 2022-10-15 NOTE — Transitions of Care (Post Inpatient/ED Visit) (Signed)
   10/15/2022  Name: Abigail Moran MRN: [id number] DOB: 04-Oct-1975  Today's TOC FU Call Status: Today's TOC FU Call Status:: Successful TOC FU Call Competed TOC FU Call Complete Date: 10/15/22  Transition Care Management Follow-up Telephone Call Date of Discharge: 10/13/22 Discharge Facility[facility]) Type of Discharge: Inpatient Admission Primary Inpatient Discharge Diagnosis:: osteomylitis How have you been since you were released from the hospital?: Better Any questions or concerns?: No  Items Reviewed: Did you receive and understand the discharge instructions provided?: Yes Medications obtained and verified?: Yes (Medications Reviewed) Any new allergies since your discharge?: No Dietary orders reviewed?: Yes Do you have support at home?: No  Home Care and Equipment/Supplies: Were Home Health Services Ordered?: NA Any new equipment or medical supplies ordered?: NA  Functional Questionnaire: Do you need assistance with bathing/showering or dressing?: No Do you need assistance with meal preparation?: No Do you need assistance with eating?: No Do you have difficulty maintaining continence: No Do you need assistance with getting out of bed/getting out of a chair/moving?: No Do you have difficulty managing or taking your medications?: No  Follow up appointments reviewed: PCP Follow-up appointment confirmed?: NA Specialist Hospital Follow-up appointment confirmed?: Yes Date of Specialist follow-up appointment?: 10/16/22 Follow-Up Specialty Provider:: [facility] Do you need transportation to your follow-up appointment?: No Do you understand care options if your condition(s) worsen?: Yes-patient verbalized understanding    SIGNATURE Karena Addison, LPN [facility] Nurse Health Advisor Direct Dial [phone]

## 2022-10-16 ENCOUNTER — Ambulatory Visit (INDEPENDENT_AMBULATORY_CARE_PROVIDER_SITE_OTHER): Payer: Self-pay | Admitting: Podiatry

## 2022-10-16 DIAGNOSIS — Z89422 Acquired absence of other left toe(s): Secondary | ICD-10-CM

## 2022-10-16 LAB — AEROBIC/ANAEROBIC CULTURE W GRAM STAIN (SURGICAL/DEEP WOUND): Gram Stain: NONE SEEN

## 2022-10-16 NOTE — Progress Notes (Signed)
  Subjective:  Patient ID: Abigail Moran, female    DOB: 1976-04-23,  MRN: [id number]  Chief Complaint  Patient presents with   Post-op Follow-up    DOS 10/11/2022 5TH TOE AMPUTATION, NO PAIN, PATIENT STATED SHE WAS DOING WELL     DOS: 10/11/2022 Procedure: Amputation of fifth toe on left foot at MPJ level    47 y.o. female returns for post-op check.  Patient reports she is doing well she denies pain at this time.  She has kept the dressing clean dry and intact since leaving the hospital.  Has been walking as tolerated in a postop shoe  Review of Systems: Negative except as noted in the HPI. Denies N/V/F/Ch.   Objective:  There were no vitals filed for this visit. There is no height or weight on file to calculate BMI. Constitutional Well developed. Well nourished.  Vascular Foot warm and well perfused. Capillary refill normal to all digits.  Calf is soft and supple, no posterior calf or knee pain, negative Homans' sign  Neurologic Normal speech. Oriented to person, place, and time. Epicritic sensation to light touch grossly present bilaterally.  Dermatologic Skin healing well without signs of infection. Skin edges well coapted without signs of infection.  Amputation site healing without evidence of necrosis  Orthopedic: No tenderness to palpation noted about the surgical site.   Multiple view plain film radiographs: Deferred at this visit there is amputation only Assessment:   1. S/P amputation of lesser toe, left    Plan:  Patient was evaluated and treated and all questions answered.  S/p foot surgery left fifth toe amputation at MPJ level for osteomyelitis -Progressing as expected post-operatively. -XR: Deferred -WB Status: Weightbearing as tolerated in postop shoe -Sutures: To remain intact until next follow-up. -Medications: Tylenol as needed -Foot redressed.  Patient to begin changing the dressing after another 5 days.  Recommend Betadine and adhesive bandage or gauze  dressing over the amputation site. -Advised not to get the left foot wet until the sutures are removed in 2 to 3 weeks.  No follow-ups on file.         Corinna Gab, DPM [facility] / [facility]

## 2022-10-19 IMAGING — MG DIGITAL DIAGNOSTIC BILAT W/ TOMO W/ CAD
8 of 15 series · 8 of 40 positions shown · non-contrast
Comparison: Previous exam(s).

CLINICAL DATA: Patient describes a palpable lump within the LEFT
breast.

EXAM:
DIGITAL DIAGNOSTIC BILATERAL MAMMOGRAM WITH TOMOSYNTHESIS AND CAD;
ULTRASOUND LEFT BREAST LIMITED
TECHNIQUE: Bilateral digital diagnostic mammography and breast tomosynthesis
was performed. The images were evaluated with computer-aided
detection.; Targeted ultrasound examination of the left breast was
performed

[L MLO synth-2D (1 of 2)]
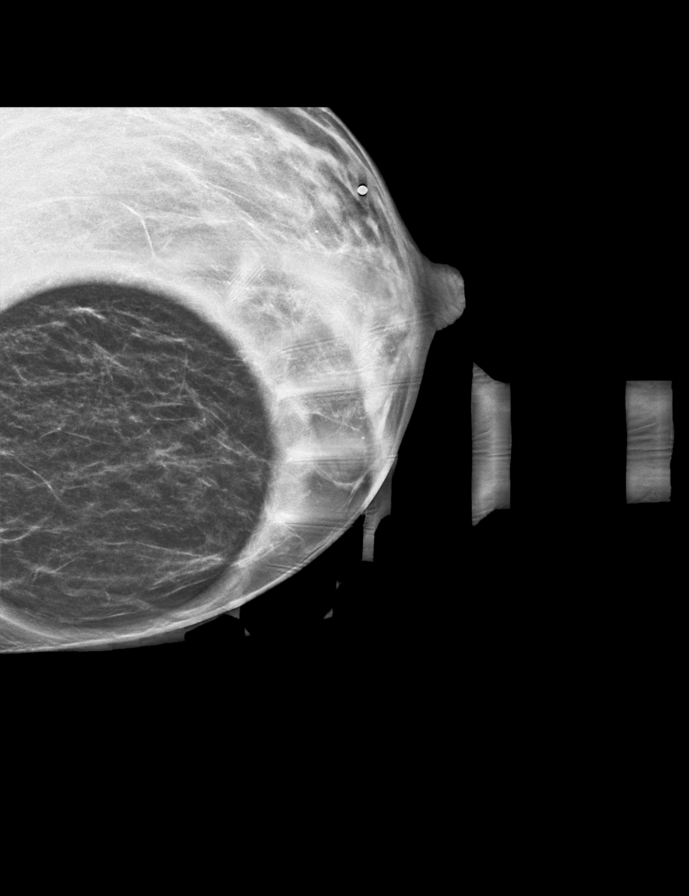

[R CC synth-2D]
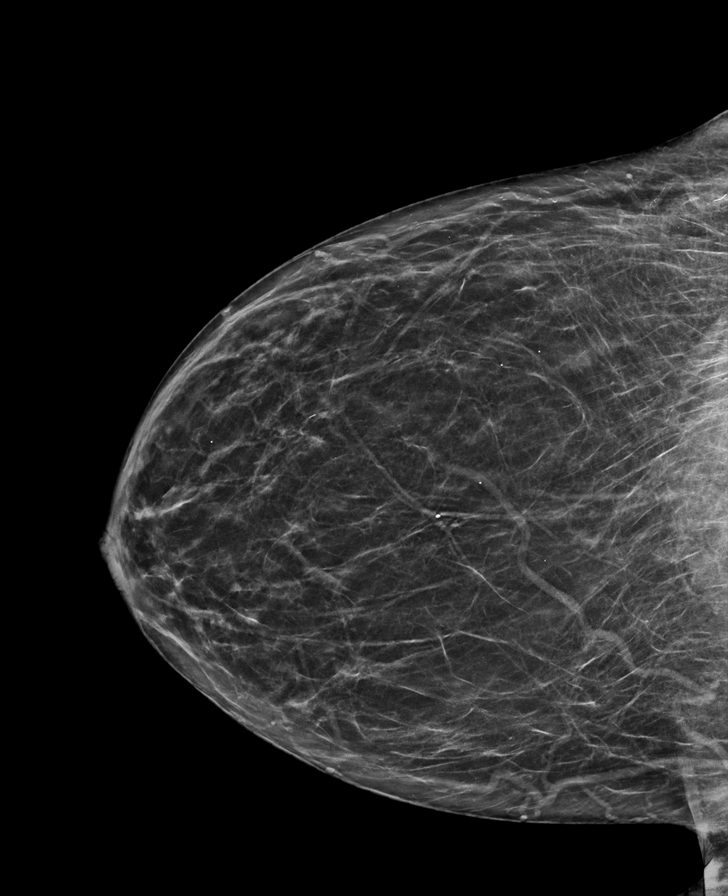

[L CC synth-2D (1 of 2)]
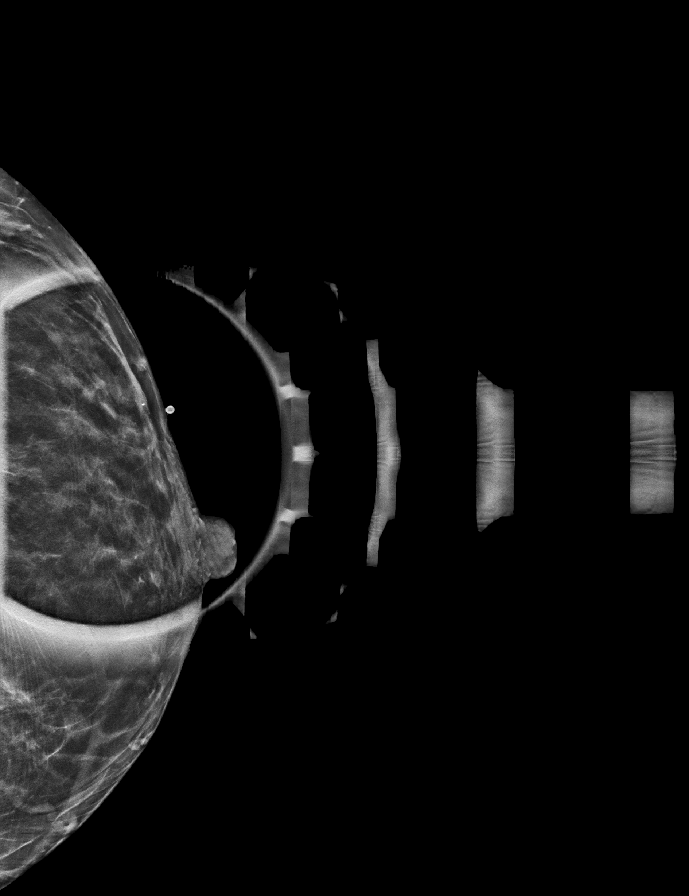

[L ML synth-2D]
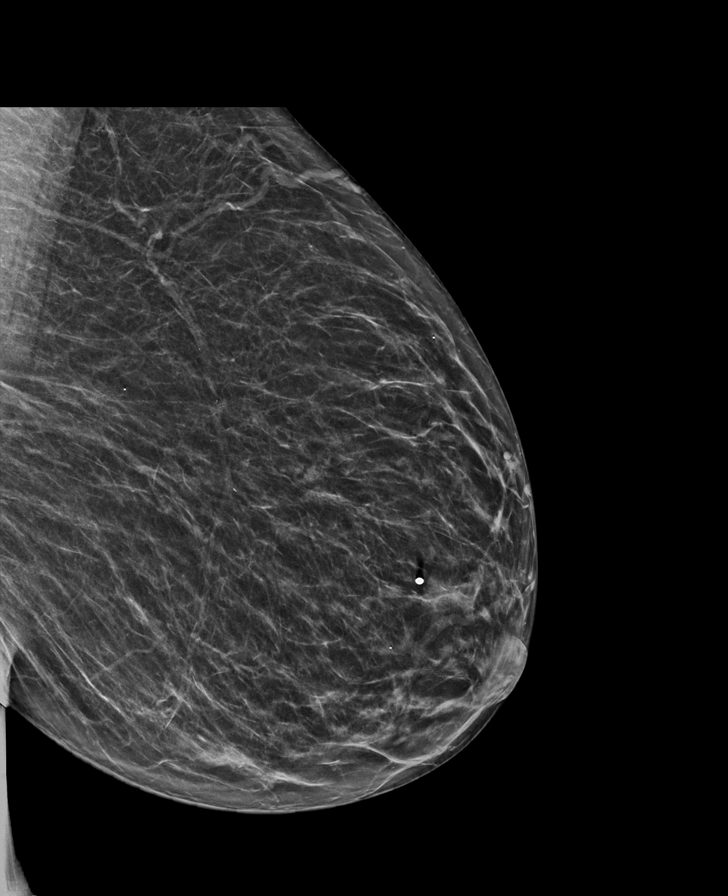

[R MLO synth-2D]
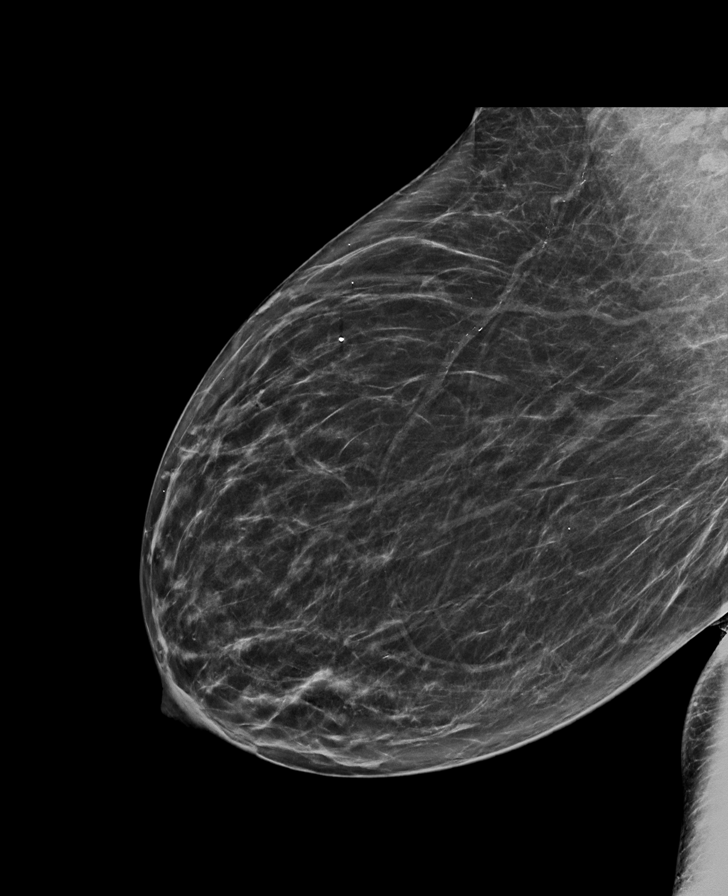

[L CC synth-2D (2 of 2)]
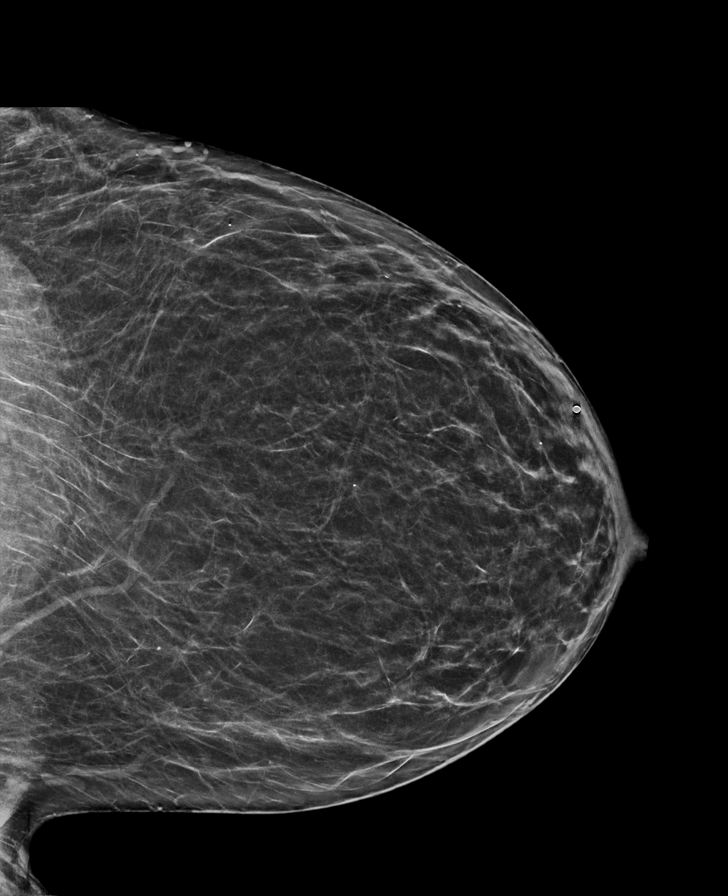

[L MLO synth-2D (2 of 2)]
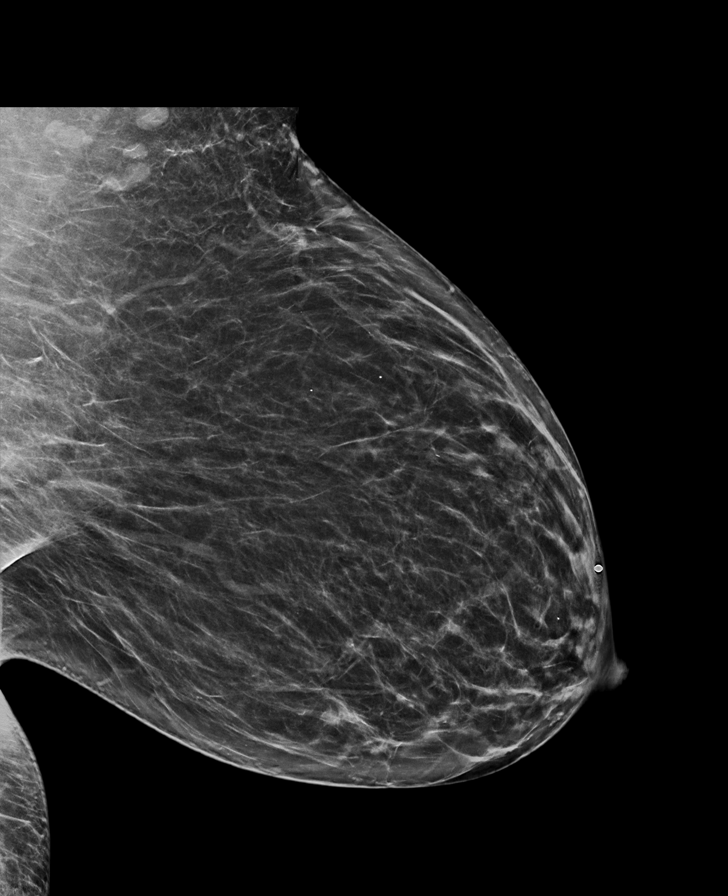

[L MLO tomo · tomo slice 30/44.0]
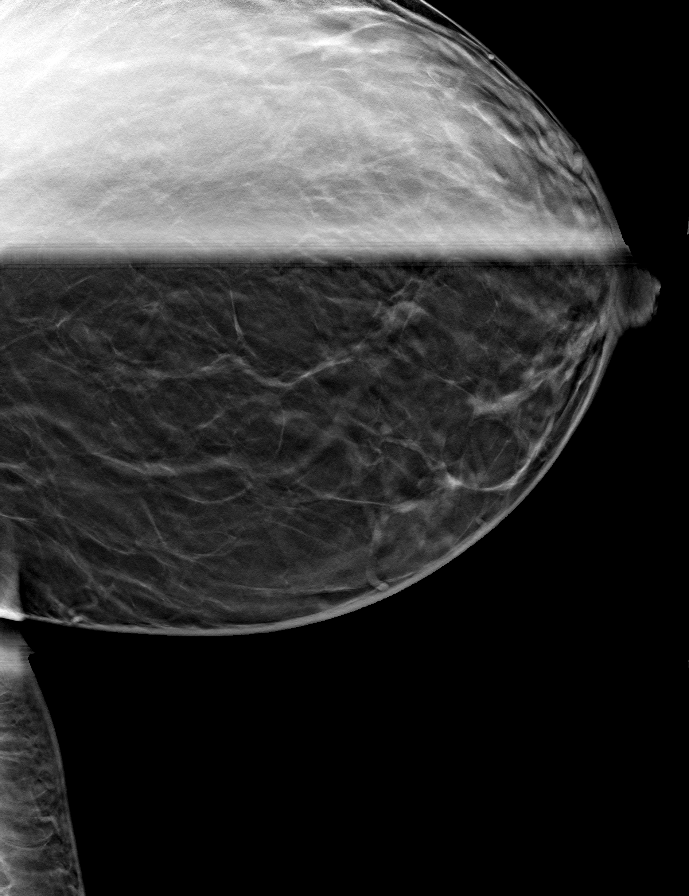

[8 of 40 positions shown; findings below may reference images not displayed]

ACR Breast Density Category b: There are scattered areas of
fibroglandular density.
FINDINGS: Bilateral diagnostic mammogram: There is a partially obscured
low-density mass within the upper-outer quadrant of the LEFT breast,
at superficial depth, measuring approximately 1 cm greatest
dimension, corresponding to the area of clinical concern with
overlying skin marker in place.

There are no new dominant masses, suspicious calcifications or
secondary signs of malignancy elsewhere within either breast.

Targeted ultrasound is performed, showing a mixed echogenicity mass
in the LEFT breast at the 2 o'clock axis, 1 cm from the nipple,
measuring 1.2 x 0.7 x 1.1 cm, corresponding to the area of clinical
concern.

LEFT axilla was evaluated with ultrasound showing no enlarged or
morphologically abnormal lymph nodes.
IMPRESSION: 1. Indeterminate mixed-echogenicity mass in the LEFT breast at the 2
o'clock axis, 1 cm from the nipple, measuring 1.2 cm, corresponding
to the palpable area of concern. This may represent fat necrosis.
Ultrasound-guided biopsy is recommended to exclude malignancy.
2. No evidence of malignancy within the RIGHT breast.

RECOMMENDATION:
Ultrasound-guided biopsy for the LEFT breast mass at the 2 o'clock
axis, 1 cm from the nipple, measuring 1.2 cm.

Ultrasound-guided biopsy is scheduled for [REDACTED].

I have discussed the findings and recommendations with the patient.
If applicable, a reminder letter will be sent to the patient
regarding the next appointment.

BI-RADS CATEGORY  4: Suspicious.

## 2022-10-29 ENCOUNTER — Ambulatory Visit: Payer: Self-pay | Admitting: Internal Medicine

## 2022-10-29 NOTE — Progress Notes (Deleted)
Patient: Abigail Moran  DOB: [birth year] MRN: [id number] PCP: Sandford Craze, NP  Referring Provider: ***  No chief complaint on file.    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  Presents for new patient appointment.She is followed by Dr. Annamary Rummage podiatry.  Patient underwent fifth toe amputation 10/11/2022 athe MPJ levle.  Seen at post-op visit on 10/16/2022 noted to be progressing well. OR Cx+ corynebacteium species, Staph Lugdensis(methacillin R).   Patient presented to the ED on 4/2 with left toe pain x 2 days, pink drainage.  On arrival to the ED vital stable.  She started on Vanco, cefepime, Flagyl.  MRI (10/10/2022) revealed fifth toe cellulitis soft tissue ulcer and underlying osteomyelitis distal and middle phalanges and early osteo proximal phalanges.  0.8 X.4X 0.8 cm plantar fluid collection consistent with abscess..  Taken to the OR with  podiatry and underwent amputation of fifth toe at MTP joint, cultures sent, path sent past showed osteomyelitis.  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     blood glucose meter kit and supplies KIT Dispense based on patient and insurance preference. Use up to four times daily as directed. E11.9 1 each 0   Blood Glucose Monitoring Suppl (ACCU-CHEK GUIDE) w/Device KIT Use 4 times Daily 1 kit 0   doxycycline (VIBRAMYCIN) 100 MG capsule Take 1 capsule (100 mg total) by mouth 2 (two) times daily. 60 capsule 0   glucose blood test strip USE FOUR TIMES DAILY 300 each 0   insulin NPH Human (NOVOLIN N) 100 UNIT/ML injection Inject 0.2 mLs (20 Units total) into the skin 2 (  two) times daily before a meal. 10 mL 1   insulin regular (NOVOLIN R) 100 units/mL injection Inject 5-8 Units  into the skin 3 (three) times daily before meals. 10 mL 1   Insulin Syringe-Needle U-100 30G X 1/2" 0.5 ML MISC Use up to 4 times daily 300 each 3   Iron, Ferrous Sulfate, 325 (65 Fe) MG TABS Take 325 mg by mouth every other day. (Patient taking differently: Take 325 mg by mouth daily.) 30 tablet    linezolid (ZYVOX) 600 MG tablet Take 1  tablet (600 mg total) by mouth every 12 (twelve) hours. 60 tablet 0   linezolid (ZYVOX) 600 MG tablet Take 1 tablet (600 mg total) by mouth every 12 (twelve) hours. 60 tablet 0   Multiple Vitamin (MULTIVITAMIN) tablet Take 1 tablet by mouth daily.     Vitamin D, Ergocalciferol, (DRISDOL) 1.25 MG (50000 UNIT) CAPS capsule Take 1 capsule (50,000 Units total) by mouth every 7 (seven) days. 12 capsule 0   No facility-administered medications prior to visit.     Allergies  Allergen Reactions   Latex Itching    Tears skin   Tape Hives    Social History   Tobacco Use   Smoking status: Never   Smokeless tobacco: Never  Vaping Use   Vaping Use: Never used  Substance Use Topics   Alcohol use: Not Currently    Comment: 3 x per month   Drug use: Not Currently    Frequency: 2.0 times per week    Types: Marijuana    Family History  Problem Relation Age of Onset   Diabetes Father    Hypertension Father    Hyperlipidemia Father    Liver cancer Maternal Grandfather    Kidney disease Neg Hx    Heart disease Neg Hx    Colon polyps Neg Hx    Colon cancer Neg Hx    Stomach cancer Neg Hx    Rectal cancer Neg Hx     Objective:  There were no vitals filed for this visit. There is no height or weight on file to calculate BMI.  Physical Exam  Lab Results: Lab Results  Component Value Date   WBC 6.7 10/12/2022   HGB 8.9 (L) 10/12/2022   HCT 28.6 (L) 10/12/2022   MCV 89.9 10/12/2022   PLT 243 10/12/2022    Lab Results  Component Value Date   CREATININE 1.08 (H) 10/13/2022   BUN 28 (H) 10/13/2022   NA 133 (L) 10/13/2022   K 3.8 10/13/2022   CL 106 10/13/2022   CO2 20 (L) 10/13/2022    Lab Results  Component Value Date   ALT 11 02/25/2022   AST 15 02/25/2022   ALKPHOS 130 (H) 02/25/2022   BILITOT 1.2 02/25/2022     Assessment & Plan:  #Osteomyelitis  #DM  -A1c 9.1  Danelle Earthly, MD [facility]   10/29/22   8:51 AM

## 2022-10-31 ENCOUNTER — Other Ambulatory Visit (HOSPITAL_COMMUNITY): Payer: Self-pay

## 2022-11-09 ENCOUNTER — Ambulatory Visit (INDEPENDENT_AMBULATORY_CARE_PROVIDER_SITE_OTHER): Payer: Self-pay | Admitting: Podiatry

## 2022-11-09 DIAGNOSIS — Z9889 Other specified postprocedural states: Secondary | ICD-10-CM

## 2022-11-09 DIAGNOSIS — Z89422 Acquired absence of other left toe(s): Secondary | ICD-10-CM

## 2022-11-09 NOTE — Progress Notes (Signed)
  Subjective:  Patient ID: Abigail Moran, female    DOB: 1976-01-24,  MRN: [id number]  Chief Complaint  Patient presents with   Routine Post Op    DOS 10/11/2022 5TH TOE AMPUTATION F/U LEFT FOOT. Sutures are intact. Denies any pain.     DOS: 10/11/2022 Procedure: Amputation of fifth toe on left foot at MPJ level    47 y.o. female returns for post-op check.  Patient reports she is doing well she denies pain at this time.  She has been changing the dressing.  Patient denies any drainage no issues at the amputation site.  No pain.  Review of Systems: Negative except as noted in the HPI. Denies N/V/F/Ch.   Objective:  There were no vitals filed for this visit. There is no height or weight on file to calculate BMI. Constitutional Well developed. Well nourished.  Vascular Foot warm and well perfused. Capillary refill normal to all digits.  Calf is soft and supple, no posterior calf or knee pain, negative Homans' sign  Neurologic Normal speech. Oriented to person, place, and time. Epicritic sensation to light touch grossly present bilaterally.  Dermatologic Skin healing well without signs of infection. Skin edges well coapted without signs of infection.  Amputation site healing without evidence of necrosis  Orthopedic: No tenderness to palpation noted about the surgical site.   Multiple view plain film radiographs: Deferred at this visit there is amputation only Assessment:   1. S/P amputation of lesser toe, left (HCC)   2. Post-operative state    Plan:  Patient was evaluated and treated and all questions answered.  S/p foot surgery left fifth toe amputation at MPJ level for osteomyelitis -Progressing as expected post-operatively. -XR: Deferred -WB Status: Weightbearing as tolerated in postop shoe for 1-2 more weeks and then transition back to regular shoe -Sutures: Open total at this visit -Medications: None needed -Foot redressed with and Band-Aid. -Okay to get foot wet and wash  with warm soapy water and then reapply Band-Aid  Return in about 3 weeks (around 11/30/2022) for f/u L 5th toe amp.         Corinna Gab, DPM [facility] / [facility]

## 2022-11-27 ENCOUNTER — Ambulatory Visit (INDEPENDENT_AMBULATORY_CARE_PROVIDER_SITE_OTHER): Payer: Self-pay | Admitting: Podiatry

## 2022-11-27 DIAGNOSIS — Z9889 Other specified postprocedural states: Secondary | ICD-10-CM

## 2022-11-27 DIAGNOSIS — Z89422 Acquired absence of other left toe(s): Secondary | ICD-10-CM

## 2022-11-27 DIAGNOSIS — L97522 Non-pressure chronic ulcer of other part of left foot with fat layer exposed: Secondary | ICD-10-CM

## 2022-11-27 NOTE — Progress Notes (Signed)
  Subjective:  Patient ID: Abigail Moran, female    DOB: Jul 29, 1975,  MRN: [id number]  Chief Complaint  Patient presents with   Routine Post Op    Patient came in tody for left foot 5th toe amputation, patient denies any pain, NO N/V/F/C/SOB, A1c- 11 BG- 140,     DOS: 10/11/2022 Procedure: Amputation of fifth toe on left foot at MPJ level    47 y.o. female returns for post-op check.  Patient reports she is doing well she denies pain at this time.  She has been changing the dressing.  Patient denies any drainage no issues at the amputation site.  No pain. She does note that a new spot has opened up near the amp site due to rubbing on the area from shoes.   Review of Systems: Negative except as noted in the HPI. Denies N/V/F/Ch.   Objective:  There were no vitals filed for this visit. There is no height or weight on file to calculate BMI. Constitutional Well developed. Well nourished.  Vascular Foot warm and well perfused. Capillary refill normal to all digits.  Calf is soft and supple, no posterior calf or knee pain, negative Homans' sign  Neurologic Normal speech. Oriented to person, place, and time. Epicritic sensation to light touch grossly present bilaterally.  Dermatologic Skin healing well without signs of infection. Skin edges well coapted without signs of infection.  Amputation site healing without evidence of necrosis. New ulceration measuring 0.6x0.4x0.2 cm at the lateral spect of the 5th met head. No maceration or purulence no erythema surrounding.   Orthopedic: No tenderness to palpation noted about the surgical site.   Multiple view plain film radiographs: Deferred at this visit there is amputation only Assessment:   1. S/P amputation of lesser toe, left (HCC)   2. Post-operative state   3. Ulcer of left foot with fat layer exposed (HCC)     Plan:  Patient was evaluated and treated and all questions answered.  S/p foot surgery left fifth toe amputation at MPJ  level for osteomyelitis -Progressing as expected post-operatively. -XR: Deferred -WB Status: ok to continue in regular shoes but offload / keep pressure of the lateral 5th met head -Medications: None needed -Foot redressed with and Band-Aid. -Okay to get foot wet and wash with warm soapy water and then reapply betadine Band-Aid  Ulcer lateral aspect fifth metatarsal head left foot -We discussed the etiology and factors that are a part of the wound healing process.  We also discussed the risk of infection both soft tissue and osteomyelitis from open ulceration.  Discussed the risk of limb loss if this happens or worsens. -Debridement as below. -Dressed with betadine, DSD. -Continue home dressing changes daily with betadine and band aid -Continue off-loading with felt padding or post op shoe -Vascular testing defered -Last antibiotics: none indicated -Imaging: deferred  Procedure: Excisional Debridement of Wound Rationale: Removal of non-viable soft tissue from the wound to promote healing.  Anesthesia: none Post-Debridement Wound Measurements: 0.6 cm x 0.4 cm x 0.2  cm  Type of Debridement: Sharp Excisional Tissue Removed: Non-viable soft tissue Depth of Debridement: subcutaneous tissue. Technique: Sharp excisional debridement to bleeding, viable wound base.  Dressing: Dry, sterile, compression dressing. Disposition: Patient tolerated procedure well.    Return in about 4 weeks (around 12/25/2022) for f/u L foot ulcer.         Corinna Gab, DPM [facility] / [facility]

## 2022-12-10 ENCOUNTER — Ambulatory Visit (INDEPENDENT_AMBULATORY_CARE_PROVIDER_SITE_OTHER): Payer: Self-pay | Admitting: Internal Medicine

## 2022-12-10 ENCOUNTER — Encounter: Payer: Self-pay | Admitting: Internal Medicine

## 2022-12-10 VITALS — BP 112/70 | HR 98 | Ht 65.0 in | Wt 224.0 lb

## 2022-12-10 DIAGNOSIS — E785 Hyperlipidemia, unspecified: Secondary | ICD-10-CM

## 2022-12-10 DIAGNOSIS — S98131A Complete traumatic amputation of one right lesser toe, initial encounter: Secondary | ICD-10-CM

## 2022-12-10 DIAGNOSIS — S98131S Complete traumatic amputation of one right lesser toe, sequela: Secondary | ICD-10-CM

## 2022-12-10 DIAGNOSIS — S98132S Complete traumatic amputation of one left lesser toe, sequela: Secondary | ICD-10-CM

## 2022-12-10 DIAGNOSIS — Z89422 Acquired absence of other left toe(s): Secondary | ICD-10-CM

## 2022-12-10 DIAGNOSIS — E1059 Type 1 diabetes mellitus with other circulatory complications: Secondary | ICD-10-CM

## 2022-12-10 DIAGNOSIS — E1042 Type 1 diabetes mellitus with diabetic polyneuropathy: Secondary | ICD-10-CM

## 2022-12-10 MED ORDER — ATORVASTATIN CALCIUM 10 MG PO TABS: 10.0000 mg | ORAL_TABLET | Freq: Every day | ORAL | 3 refills | Status: DC

## 2022-12-10 NOTE — Patient Instructions (Signed)
Novolin- N 25 units every morning, 20 units at bedtime Novolin- R 12 units before each meal Novolin-R correctional insulin: ADD extra units on insulin to your meal-time Novolin-R dose if your blood sugars are higher than 155. Use the scale below to help guide you:   Blood sugar before meal Number of units to inject  Less than 155 0 unit  156 -  180 1 units  181 -  205 2 units  206 -  230 3 units  231 -  255 4 units  256 -  280 5 units  281 -  305 6 units  306 -  330 7 units  331 -  355 8 units  356 - 380 9 units   HOW TO TREAT LOW BLOOD SUGARS (Blood sugar LESS THAN 70 MG/DL) Please follow the RULE OF 15 for the treatment of hypoglycemia treatment (when your (blood sugars are less than 70 mg/dL)   STEP 1: Take 15 grams of carbohydrates when your blood sugar is low, which includes:  3-4 GLUCOSE TABS  OR 3-4 OZ OF JUICE OR REGULAR SODA OR ONE TUBE OF GLUCOSE GEL    STEP 2: RECHECK blood sugar in 15 MINUTES STEP 3: If your blood sugar is still low at the 15 minute recheck --> then, go back to STEP 1 and treat AGAIN with another 15 grams of carbohydrates.

## 2022-12-10 NOTE — Progress Notes (Signed)
Name: Abigail Moran  MRN/ DOB: [id number], Apr 02, 1976   Age/ Sex: 47 y.o., female    PCP: Sandford Craze, NP   Reason for Endocrinology Evaluation: Type 1 Diabetes Mellitus     Date of Initial Endocrinology Visit: 12/10/2022     PATIENT IDENTIFIER: Abigail Moran is a 47 y.o. female with a past medical history of DM, CAD. The patient presented for initial endocrinology clinic visit on 12/10/2022 for consultative assistance with her diabetes management.    HPI: Ms. Perdomo was    Diagnosed with DM in 1997 Prior Medications tried/Intolerance: Was started on insulin in 2001 Currently checking blood sugars 2 x / day Hypoglycemia episodes : no             Hemoglobin A1c has ranged from 8.5% in 2016, peaking at 13.1% in 2023. Patient has history of DKA  She was following with Dr. Elvera Lennox until 2022 when she was discharged from the practice,   Eats 2 meals a day, snacks occasionally.  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 Denies: CKD Last eye exam: Completed 2022  Macrovascular complications:  CAD Denies:  PVD, CVA   PAST HISTORY: Past Medical History:  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   Heart attack (HCC) 05/04/15   History of DVT (deep vein  thrombosis)    following hospitalization 2020   History of kidney stones    Menorrhagia with irregular cycle 10/11/2020   Renal disorder    Sepsis due to cellulitis (HCC) 05/15/2021   Vitamin D deficiency 09/14/2014   Past Surgical History:  Past Surgical History:  Procedure Laterality Date   ABDOMINAL AORTOGRAM W/LOWER EXTREMITY N/A 03/01/2022   Procedure: ABDOMINAL AORTOGRAM W/LOWER EXTREMITY;  Surgeon: Cephus Shelling, MD;  Location: [facility];  Service: Cardiovascular;  Laterality: N/A;   AMPUTATION Right 03/02/2022   Procedure: AMPUTATION OF FOURTH AND FIFTH TOES, Irrigation and debridment;  Surgeon: Felecia Shelling, DPM;  Location: [facility] OR;  Service: Podiatry;  Laterality: Right;   AMPUTATION TOE Left 10/11/2022   Procedure: AMPUTATION OF FIFTH TOE;  Surgeon: Pilar Plate, DPM;  Location: [facility] OR;  Service: Podiatry;  Laterality: Left;   CARDIAC CATHETERIZATION  2016   CHOLECYSTECTOMY  2001   I & D EXTREMITY Right 03/05/2022   Procedure: IRRIGATION AND DEBRIDEMENT AND CLOSURE RIGHT FOOT;  Surgeon: Louann Sjogren, DPM;  Location: [facility] OR;  Service: Podiatry;  Laterality: Right;   WISDOM TOOTH EXTRACTION      Social History:  reports that she has never smoked. She has never used smokeless tobacco. She reports that she does not currently use alcohol. She reports that she does not currently use drugs after having used the following drugs: Marijuana. Frequency: 2.00  times per week. Family History:  Family History  Problem Relation Age of Onset   Diabetes Father    Hypertension Father    Hyperlipidemia Father    Liver cancer Maternal Grandfather    Kidney disease Neg Hx    Heart disease Neg Hx    Colon polyps Neg Hx    Colon cancer Neg Hx    Stomach cancer Neg Hx    Rectal cancer Neg Hx      HOME MEDICATIONS: Allergies as of 12/10/2022       Reactions   Latex Itching   Tears skin   Tape Hives        Medication List        Accurate as of December 10, 2022 10:02  AM. If you have any questions, ask your nurse or doctor.          Accu-Chek Guide test strip Generic drug: glucose blood USE FOUR TIMES DAILY   Accu-Chek Guide w/Device Kit Use 4 times Daily   Accu-Chek Softclix Lancets lancets USE FOUR TIMES DAILY   acetaminophen 325 MG tablet Commonly known as: TYLENOL Take 2 tablets (650 mg total) by mouth every 6 (six) hours as needed for moderate pain.   blood glucose meter kit and supplies Kit Dispense based on patient and insurance preference. Use up to four times daily as directed. E11.9   HumuLIN N 100 UNIT/ML injection Generic drug: insulin NPH Human Inject 0.2 mLs (20 Units total) into the skin 2 (two) times daily before a meal.   Iron (Ferrous Sulfate) 325 (65 Fe) MG Tabs Take 325 mg by mouth every other day. What changed: when to take this   linezolid 600 MG tablet Commonly known as: ZYVOX Take 1 tablet (600 mg total) by mouth every 12 (twelve) hours.   multivitamin tablet Take 1 tablet by mouth daily.   NovoLIN R 100 units/mL injection Generic drug: insulin regular Inject 5-8 Units  into the skin 3 (three) times daily before meals.   UltiCare Insulin Syringe 30G X 1/2" 0.5 ML Misc Generic drug: Insulin Syringe-Needle U-100 Use up to 4 times daily   Vitamin D (Ergocalciferol) 1.25 MG (50000 UNIT) Caps capsule Commonly known as: DRISDOL Take 1 capsule (50,000 Units total) by mouth every 7 (seven) days.         ALLERGIES: Allergies  Allergen Reactions   Latex Itching    Tears skin   Tape Hives     REVIEW OF SYSTEMS: A comprehensive ROS was conducted with the patient and is negative except as per HPI     OBJECTIVE:   VITAL SIGNS: BP 112/70 (BP Location: Left Arm, Patient Position: Sitting, Cuff Size: Large)   Pulse 98   Ht 5\' 5"  (1.651 m)   Wt 224 lb (101.6 kg)   SpO2 100%   BMI 37.28 kg/m    PHYSICAL EXAM:  General: Pt appears well and is in NAD  Neck: General: Supple without adenopathy or  carotid bruits. Thyroid: Thyroid size normal.  No goiter or nodules appreciated.   Lungs: Clear with good BS bilat   Heart: RRR   Abdomen:  soft, nontender  Extremities:  Lower extremities - trace pretibial edema.   Neuro: MS is good with appropriate affect, pt is alert and Ox3    DM foot exam: 12/10/2022   Right foot fourth and fifth toe amputations, left foot fifth toe amputation The pedal pulses are 2+ on right and 2+ on left. The sensation is decreased to  a screening 5.07, 10 gram monofilament bilaterally   DATA REVIEWED:  Lab Results  Component Value Date   HGBA1C 9.1 (H) 10/09/2022   HGBA1C 13.1 (H) 02/26/2022   HGBA1C 12.6 (H) 03/31/2021     Latest Reference Range & Units 10/13/22 02:53  Sodium 135 - 145 mmol/L 133 (L)  Potassium 3.5 - 5.1 mmol/L 3.8  Chloride 98 - 111 mmol/L 106  CO2 22 - 32 mmol/L 20 (L)  Glucose 70 - 99 mg/dL 161 (H)  BUN 6 - 20 mg/dL 28 (H)  Creatinine 0.96 - 1.00 mg/dL 0.45 (H)  Calcium 8.9 - 10.3 mg/dL 8.3 (L)  Anion gap 5 - 15  7  GFR, Estimated >60 mL/min >60  (L): Data is abnormally low (H): Data is abnormally high  ASSESSMENT / PLAN / RECOMMENDATIONS:   1) Type 1 Diabetes Mellitus, poorly controlled, With neuropathic,  and macrovascular complications, toe amputations- Most recent A1c of 7.0 %. Goal A1c < 7.0 %.    Plan: GENERAL: I have discussed with the patient the pathophysiology of diabetes. We stressed the importance of lifestyle changes. I explained the complications associated with diabetes including retinopathy, nephropathy, neuropathy as well as increased risk of cardiovascular disease. We went over the benefit seen with glycemic control.  I explained to the patient that diabetic patients are at higher than normal risk for amputations.  Patient assistance form for [employer] was provided today Unfortunately, patient admits to medication nonadherence, I have offered to switch her regular insulin to pens for ease of use during  working hours, but due to cost she would like to remain on the vials, I have advised the patient to take regular insulin 20 minutes before meal I will increase her NPH in the morning, she is not consistent with taking it at night She will be given a standing dose of regular insulin with each meal plus a correction scale to be used before each meal Patient took her NPH as well as regular insulin 10 units but did not eat breakfast yet, I did explain to the patient that she is at high risk for hypoglycemia if she takes regular insulin and does not eat within the next 20-30 minutes of taking it, we discussed the importance of eating within 20 minutes of taking regular insulin She is interested in an insulin pump, but unfortunately due to lack of health insurance this is cost prohibitive  MEDICATIONS: Novolin-N 25 units every morning and 20 units at bedtime Novolin-R 12 units 3 times daily before every meal CF: Novolin-R (BG -130/25)  EDUCATION / INSTRUCTIONS: BG monitoring instructions: Patient is instructed to check her blood sugars 3 times a day. Call [facility] if: BG persistently < 70  I reviewed the Rule of 15 for the treatment of hypoglycemia in detail with the patient. Literature supplied.   2) Diabetic complications:  Eye: Does  have known diabetic retinopathy.  Neuro/ Feet: Does  have known diabetic peripheral neuropathy. Renal: Patient does  have known baseline CKD. She is not on an ACEI/ARB at present.  3)Dyslipidemia:  -Historically, LDL above goal -Discussed cardiovascular benefits of statin therapy -She initially declined statin therapy but by the end of the visit she agreed to start atorvastatin   Medication Start atorvastatin 10 mg daily   Follow-up in 3 months   Signed electronically by: Lyndle Herrlich, MD  [facility] End[facility]  [facility] [address] [place] [place], [place] [address] Phone: [phone] FAX:  [phone]  CC: Sandford Craze, NP [address] [place] [address] Phone: [phone]  Fax: [phone]    Return to Endocrinology clinic as below: Future Appointments  Date Time Provider Department Center  12/24/2022 11:15 AM Standiford, Jenelle Mages, DPM [facility]

## 2022-12-18 ENCOUNTER — Telehealth: Payer: Self-pay

## 2022-12-18 NOTE — Telephone Encounter (Signed)
Patient states her sugar were running low in 50-60 range last week. She states that today her sugar is 89 not fasting but she hasn't took any of the Novolin R. Patient states that she was woken up yesterday by EMS due to her sugar been low. She didn't have a actually number for yesterday. She feels like the Novolin R is too much at 12 units per meal plus sliding scale. She has still been taking the Novolin N 20 units BID.

## 2022-12-18 NOTE — Telephone Encounter (Signed)
Patient advised and will make changes  

## 2022-12-24 ENCOUNTER — Ambulatory Visit (INDEPENDENT_AMBULATORY_CARE_PROVIDER_SITE_OTHER): Payer: Self-pay | Admitting: Podiatry

## 2022-12-24 ENCOUNTER — Encounter: Payer: Self-pay | Admitting: Hematology & Oncology

## 2022-12-24 DIAGNOSIS — L97522 Non-pressure chronic ulcer of other part of left foot with fat layer exposed: Secondary | ICD-10-CM

## 2022-12-24 DIAGNOSIS — Z89422 Acquired absence of other left toe(s): Secondary | ICD-10-CM

## 2022-12-24 NOTE — Progress Notes (Signed)
  Subjective:  Patient ID: Abigail Moran, female    DOB: Oct 26, 1975,  MRN: [id number]  Chief Complaint  Patient presents with   Follow-up    f/u left 5th toe amputation     DOS: 10/11/2022 Procedure: Amputation of fifth toe on left foot at MPJ level   47 y.o. female returns for post-op check.  Patient states that she has been applying Betadine and dressing to the small area of wound at the amputation site plantar lateral left fifth MPJ.  Denies drainage.  Review of Systems: Negative except as noted in the HPI. Denies N/V/F/Ch.   Objective:  There were no vitals filed for this visit. There is no height or weight on file to calculate BMI. Constitutional Well developed. Well nourished.  Vascular Foot warm and well perfused. Capillary refill normal to all digits.  Calf is soft and supple, no posterior calf or knee pain, negative Homans' sign  Neurologic Normal speech. Oriented to person, place, and time. Epicritic sensation to light touch grossly present bilaterally.  Dermatologic Skin healing well without signs of infection. Skin edges well coapted without signs of infection.  Amputation site healing without evidence of necrosis.  Improving ulceration with eschar measuring 0.6x0.3x0.1 cm at the lateral spect of the 5th met head. No maceration or purulence no erythema surrounding.   Orthopedic: No tenderness to palpation noted about the surgical site.   Multiple view plain film radiographs: Deferred at this visit there is amputation only Assessment:   1. S/P amputation of lesser toe, left (HCC)   2. Ulcer of left foot with fat layer exposed (HCC)      Plan:  Patient was evaluated and treated and all questions answered.  S/p foot surgery left fifth toe amputation at MPJ level for osteomyelitis -Progressing as expected post-operatively. -XR: Deferred -WB Status: ok to continue in regular shoes but offload / keep pressure of the lateral 5th met head -Medications: None  needed -Foot redressed with and Band-Aid. -Okay to get foot wet and wash with warm soapy water and then reapply betadine Band-Aid  Ulcer lateral aspect fifth metatarsal head left foot -We discussed the etiology and factors that are a part of the wound healing process.  We also discussed the risk of infection both soft tissue and osteomyelitis from open ulceration.  Discussed the risk of limb loss if this happens or worsens. -Debridement as below. -Dressed with betadine, DSD. -Continue home dressing changes daily with betadine and band aid -Continue off-loading with felt padding or post op shoe -Vascular testing defered -Last antibiotics: none indicated -Imaging: deferred  Procedure: Excisional Debridement of Wound Rationale: Removal of non-viable soft tissue from the wound to promote healing.  Anesthesia: none Post-Debridement Wound Measurements: 0.6 cm x 0.3 cm x 0.1  cm  Type of Debridement: Sharp Excisional Tissue Removed: Non-viable soft tissue Depth of Debridement: subcutaneous tissue. Technique: Sharp excisional debridement to bleeding, viable wound base.  Dressing: Dry, sterile, compression dressing. Disposition: Patient tolerated procedure well.    Return in about 4 weeks (around 01/21/2023) for Follow-up left foot ulceration at the amputation site.         Corinna Gab, DPM [facility] / [facility]

## 2023-01-08 ENCOUNTER — Ambulatory Visit: Payer: Self-pay | Admitting: Podiatry

## 2023-01-28 ENCOUNTER — Ambulatory Visit (INDEPENDENT_AMBULATORY_CARE_PROVIDER_SITE_OTHER): Payer: Self-pay | Admitting: Podiatry

## 2023-01-28 DIAGNOSIS — Z91199 Patient's noncompliance with other medical treatment and regimen due to unspecified reason: Secondary | ICD-10-CM

## 2023-01-28 NOTE — Progress Notes (Signed)
No show for apt.

## 2023-03-10 ENCOUNTER — Emergency Department (HOSPITAL_BASED_OUTPATIENT_CLINIC_OR_DEPARTMENT_OTHER): Payer: Self-pay

## 2023-03-10 ENCOUNTER — Emergency Department (HOSPITAL_BASED_OUTPATIENT_CLINIC_OR_DEPARTMENT_OTHER)
Admission: EM | Admit: 2023-03-10 | Discharge: 2023-03-11 | Disposition: A | Discharge status: Home / Self Care | Payer: Self-pay | Attending: Emergency Medicine | Admitting: Emergency Medicine

## 2023-03-10 ENCOUNTER — Other Ambulatory Visit: Payer: Self-pay

## 2023-03-10 ENCOUNTER — Encounter (HOSPITAL_BASED_OUTPATIENT_CLINIC_OR_DEPARTMENT_OTHER): Payer: Self-pay | Admitting: Emergency Medicine

## 2023-03-10 DIAGNOSIS — Z91040 Latex allergy status: Secondary | ICD-10-CM | POA: Insufficient documentation

## 2023-03-10 DIAGNOSIS — Z4801 Encounter for change or removal of surgical wound dressing: Secondary | ICD-10-CM | POA: Insufficient documentation

## 2023-03-10 DIAGNOSIS — Z5189 Encounter for other specified aftercare: Secondary | ICD-10-CM

## 2023-03-10 NOTE — ED Triage Notes (Signed)
Pt c/o sore place to side of LT foot; had LT little toe amputated in April; concerned that she has another infection

## 2023-03-11 NOTE — ED Notes (Signed)
Bacitracin and bandage applied to foot.

## 2023-03-11 NOTE — ED Provider Notes (Signed)
[facility] [place] Provider Note   CSN: [id number] Arrival date & time: 03/10/23  2207     History  Chief Complaint  Patient presents with   Wound Check    Abigail Moran is a 47 y.o. female.  History of left small toe amputations in the past.  Being followed by podiatry.  She states that couple weeks ago she had a little discomfort there so she started caring for her and she did see some pus come out of it so she restarted her antibiotics (linezolid) and that seems to have improved.  She wrapped it up and has left it wrapped for the last few days.  No fevers or pain.  No consistent drainage.  She does want to get checked out to make sure that is not infected.  No redness or pain going up her leg or ankle.  Plans to get appointment with her podiatrist in September.   Wound Check       Home Medications Prior to Admission medications   Medication Sig Start Date End Date Taking? Authorizing Provider  Accu-Chek Softclix Lancets lancets USE FOUR TIMES DAILY 03/06/22   Azucena Fallen, MD  acetaminophen (TYLENOL) 325 MG tablet Take 2 tablets (650 mg total) by mouth every 6 (six) hours as needed for moderate pain. 10/13/22   Arrien, York Ram, MD  atorvastatin (LIPITOR) 10 MG tablet Take 1 tablet (10 mg total) by mouth daily. 12/10/22   Shamleffer, Konrad Dolores, MD  blood glucose meter kit and supplies KIT Dispense based on patient and insurance preference. Use up to four times daily as directed. E11.9 03/06/22   Azucena Fallen, MD  Blood Glucose Monitoring Suppl (ACCU-CHEK GUIDE) w/Device KIT Use 4 times Daily 03/06/22   Azucena Fallen, MD  glucose blood test strip USE FOUR TIMES DAILY 03/06/22   Azucena Fallen, MD  insulin NPH Human (NOVOLIN N) 100 UNIT/ML injection Inject 0.2 mLs (20 Units total) into the skin 2 (two) times daily before a meal. 03/06/22   Azucena Fallen, MD  insulin regular (NOVOLIN R) 100 units/mL  injection Inject 5-8 Units  into the skin 3 (three) times daily before meals. 03/06/22   Azucena Fallen, MD  Insulin Syringe-Needle U-100 30G X 1/2" 0.5 ML MISC Use up to 4 times daily 03/06/22   Azucena Fallen, MD  Iron, Ferrous Sulfate, 325 (65 Fe) MG TABS Take 325 mg by mouth every other day. Patient taking differently: Take 325 mg by mouth daily. 03/20/22   Sandford Craze, NP  linezolid (ZYVOX) 600 MG tablet Take 1 tablet (600 mg total) by mouth every 12 (twelve) hours. 10/13/22   Arrien, York Ram, MD  Multiple Vitamin (MULTIVITAMIN) tablet Take 1 tablet by mouth daily.    [provider]  Vitamin D, Ergocalciferol, (DRISDOL) 1.25 MG (50000 UNIT) CAPS capsule Take 1 capsule (50,000 Units total) by mouth every 7 (seven) days. 03/20/22   Sandford Craze, NP      Allergies    Latex and Tape    Review of Systems   Review of Systems  Physical Exam Updated Vital Signs BP 128/88   Pulse 100   Temp 98 F (36.7 C) (Oral)   Resp 16   Ht 5' 5.5" (1.664 m)   Wt 101.2 kg   LMP 02/06/2023   SpO2 97%   BMI 36.54 kg/m  Physical Exam Vitals and nursing note reviewed.  Constitutional:      Appearance:  She is well-developed.  HENT:     Head: Normocephalic and atraumatic.  Eyes:     Pupils: Pupils are equal, round, and reactive to light.  Cardiovascular:     Rate and Rhythm: Normal rate and regular rhythm.  Pulmonary:     Effort: No respiratory distress.     Breath sounds: No stridor.  Abdominal:     General: There is no distension.  Musculoskeletal:        General: Normal range of motion.     Cervical back: Normal range of motion.     Comments: Ulcer rated wound to the lateral aspect of her left distal fifth metatarsal without drainage.  Slightly dark skin.  Her feet have an odor to them but more consistent with normal foot odor than bacterial infection.  Skin:    General: Skin is warm and dry.  Neurological:     General: No focal deficit present.      Mental Status: She is alert.     ED Results / Procedures / Treatments   Labs (all labs ordered are listed, but only abnormal results are displayed) Labs Reviewed - No data to display  EKG None  Radiology DG Foot Complete Left  Result Date: 03/10/2023 CLINICAL DATA:  Sore placed to the site of the left foot. Fifth toe amputation in April. Some purulent drainage today. EXAM: LEFT FOOT - COMPLETE 3+ VIEW COMPARISON:  Left foot radiographs 10/11/2022 FINDINGS: Fifth toe amputation at the MTP joint. Mild adjacent soft tissue swelling about the fifth toe and dorsum of the foot. No evidence of osteomyelitis. No acute fracture or dislocation. IMPRESSION: Soft tissue swelling about the amputated fifth toe. No evidence of osteomyelitis. Electronically Signed   By: Minerva Fester M.D.   On: 03/10/2023 23:21    Procedures Procedures    Medications Ordered in ED Medications - No data to display  ED Course/ Medical Decision Making/ A&P                                 Medical Decision Making Amount and/or Complexity of Data Reviewed Radiology: ordered.   X-rays reassuring there is no evidence of osteomyelitis on it.  Understand this is not the best test she also does not have a fever, drainage or evidence of cellulitis at this time.  The wound bed actually looks pretty good.  She may have had an abscess before as there is some loose feeling skin around it.  She is still taking the linezolid.  She is going to follow-up with her podiatrist.  I did discuss with her proper wound care rather than leaving the wound wrapped for multiple days at time that she needed to let it air out and clean it at least once a day if not twice a day.  Also did discussed getting appropriate diabetic footwear.  If not continuing to improve we will follow-up with her podiatrist or return here for definitive testing.  Final Clinical Impression(s) / ED Diagnoses Final diagnoses:  Visit for wound check    Rx / DC  Orders ED Discharge Orders     None         Summit Arroyave, Barbara Cower, MD 03/11/23 [phone]

## 2023-03-25 ENCOUNTER — Ambulatory Visit (INDEPENDENT_AMBULATORY_CARE_PROVIDER_SITE_OTHER): Payer: Self-pay | Admitting: Podiatry

## 2023-03-25 DIAGNOSIS — L97522 Non-pressure chronic ulcer of other part of left foot with fat layer exposed: Secondary | ICD-10-CM

## 2023-03-25 DIAGNOSIS — Z89422 Acquired absence of other left toe(s): Secondary | ICD-10-CM

## 2023-03-25 NOTE — Progress Notes (Signed)
Subjective:  Patient ID: Abigail Moran, female    DOB: [birth year],  MRN: [id number]  Chief Complaint  Patient presents with   Wound Check    Open area to the lateral aspect of left foot. Patient has been applying iodine and covering area with a band-aid. She went to med center [place]'s.     47 y.o. female presents with concern for ulceration of the lateral aspect of the left fifth metatarsal head.  Previous had an amputation of the left fifth toe earlier this year.  Had fully healed at amputation site but then subsequently developed a wound on the outside of the forefoot she thinks may be because she got the area wet too quickly after the amputation.  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   Heart attack (HCC) 05/04/15   History of DVT (deep vein thrombosis)    following hospitalization 2020   History of kidney stones    Menorrhagia with irregular cycle 10/11/2020   Renal disorder    Sepsis due to cellulitis (HCC) 05/15/2021   Vitamin D deficiency 09/14/2014    Allergies  Allergen Reactions   Latex Itching    Tears skin   Tape Hives    ROS: Negative except as per HPI above  Objective:  General: AAO x3, NAD  Dermatological: Attention directed to the lateral aspect of the left forefoot there is no to be a small hyperkeratotic lesion with underlying ulceration present the lateral aspect of the fifth metatarsal head.  Upon debridement there is no purulence expressed a process about subcutaneous fat tissue mild hyperkeratotic tissue surrounding.  Wound base is relatively healthy with fibrotic and granular tissue present.  Wound measures 0.3 x 0.1 x 0.1 cm  postdebridement       Vascular:  Dorsalis Pedis artery and Posterior Tibial artery pedal pulses are 2/4 bilateral.  Capillary fill time < 3 sec to all digits.   Neruologic: Grossly diminished via light touch protective sensation absent  Musculoskeletal: Send post left fifth toe amputation  Gait: Unassisted, Nonantalgic.      Radiographs: Reviewed x-rays from March 10, 2023 [place] urgent care Soft tissue swelling about the amputated fifth toe. No evidence of osteomyelitis.  Assessment:   1. Ulcer of left foot with fat layer exposed (HCC)   2. S/P amputation of lesser toe, left (HCC)      Plan:  Patient was evaluated and treated and all questions answered.  # Ulceration at the lateral aspect of the fifth metatarsal head left foot status post prior fifth toe amputation  -We discussed the etiology and factors that are a part of the wound healing process.  We also discussed the risk of infection both soft tissue and osteomyelitis from open ulceration.  Discussed the risk of limb loss if this happens or worsens. -Debridement as below. -Dressed with abx ointment, DSD. -Continue home dressing changes daily with abx ointment and band aid -Continue off-loading with surgical shoe. -Vascular testing deferred -Last antibiotics: Patient currently taking linezolid 600 mg twice daily.  Continue until the course is completed -Imaging: x-ray reviewed, shows no signs of erosions, osteolysis, osteomyelitis or emphysema.  Procedure: Excisional Debridement  of Wound Rationale: Removal of non-viable soft tissue from the wound to promote healing.  Anesthesia: none Post-Debridement Wound Measurements: 0.3 cm x 0.1 cm x 0.1 cm  Type of Debridement: Sharp Excisional Tissue Removed: Non-viable soft tissue Depth of Debridement: subcutaneous tissue. Technique: Sharp excisional debridement to bleeding, viable wound base.  Dressing: Dry, sterile, compression dressing. Disposition: Patient  tolerated procedure well.   Return in about 3 weeks (around 04/15/2023) for f/u L foot ulcer.          Corinna Gab, DPM [facility] / [facility]

## 2023-04-08 ENCOUNTER — Ambulatory Visit (INDEPENDENT_AMBULATORY_CARE_PROVIDER_SITE_OTHER): Payer: Self-pay | Admitting: Podiatry

## 2023-04-08 DIAGNOSIS — Z91199 Patient's noncompliance with other medical treatment and regimen due to unspecified reason: Secondary | ICD-10-CM

## 2023-04-08 NOTE — Progress Notes (Signed)
No show

## 2023-04-15 ENCOUNTER — Ambulatory Visit: Payer: Self-pay | Admitting: Podiatry

## 2023-05-17 ENCOUNTER — Ambulatory Visit: Payer: Self-pay | Admitting: Internal Medicine

## 2023-05-17 NOTE — Progress Notes (Deleted)
Name: Abigail Moran  MRN/ DOB: [id number], 09-Jul-1976   Age/ Sex: 47 y.o., female    PCP: Sandford Craze, NP   Reason for Endocrinology Evaluation: Type 1 Diabetes Mellitus     Date of Initial Endocrinology Visit: 12/10/2022    PATIENT IDENTIFIER: Ms. Abigail Moran is a 47 y.o. female with a past medical history of DM, CAD. The patient presented for initial endocrinology clinic visit on 12/10/2022 for consultative assistance with her diabetes management.    HPI: [name] was    Diagnosed with DM in 1997 Prior Medications tried/Intolerance: Was started on insulin in 2001           Hemoglobin A1c has ranged from 8.5% in 2016, peaking at 13.1% in 2023. Patient has history of DKA  She was following with Dr. 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 Denies: CKD Last eye exam: Completed 2022  Macrovascular complications:  CAD Denies:  PVD, CVA   PAST HISTORY: Past Medical History:  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   Heart attack (HCC) 05/04/15   History of DVT (deep vein thrombosis)    following  hospitalization 2020   History of kidney stones    Menorrhagia with irregular cycle 10/11/2020   Renal disorder    Sepsis due to cellulitis (HCC) 05/15/2021   Vitamin D deficiency 09/14/2014   Past Surgical History:  Past Surgical History:  Procedure Laterality Date   ABDOMINAL AORTOGRAM W/LOWER EXTREMITY N/A 03/01/2022   Procedure: ABDOMINAL AORTOGRAM W/LOWER EXTREMITY;  Surgeon: Cephus Shelling, MD;  Location: [facility];  Service: Cardiovascular;  Laterality: N/A;   AMPUTATION Right 03/02/2022   Procedure: AMPUTATION OF FOURTH AND FIFTH TOES, Irrigation and debridment;  Surgeon: Felecia Shelling, DPM;  Location: [facility];  Service: Podiatry;  Laterality: Right;   AMPUTATION TOE Left 10/11/2022   Procedure: AMPUTATION OF FIFTH TOE;  Surgeon: Pilar Plate, DPM;  Location: [facility];  Service: Podiatry;  Laterality: Left;   CARDIAC CATHETERIZATION  2016   CHOLECYSTECTOMY  2001   I & D EXTREMITY Right 03/05/2022   Procedure: IRRIGATION AND DEBRIDEMENT AND CLOSURE RIGHT FOOT;  Surgeon: Louann Sjogren, DPM;  Location: [facility];  Service: Podiatry;  Laterality: Right;   WISDOM TOOTH EXTRACTION      Social History:  reports that she has never smoked. She has never used smokeless tobacco. She reports that she does not currently use alcohol. She reports that she does not currently use drugs after having used the following drugs: Marijuana. Frequency: 2.00 times per week. Family  History:  Family History  Problem Relation Age of Onset   Diabetes Father    Hypertension Father    Hyperlipidemia Father    Liver cancer Maternal Grandfather    Kidney disease Neg Hx    Heart disease Neg Hx    Colon polyps Neg Hx    Colon cancer Neg Hx    Stomach cancer Neg Hx    Rectal cancer Neg Hx      HOME MEDICATIONS: Allergies as of 05/17/2023       Reactions   Latex Itching   Tears skin   Tape Hives        Medication List        Accurate as of May 17, 2023  6:48 AM. If you have any  questions, ask your nurse or doctor.          Accu-Chek Guide test strip Generic drug: glucose blood USE FOUR TIMES DAILY   Accu-Chek Guide w/Device Kit Use 4 times Daily   Accu-Chek Softclix Lancets lancets USE FOUR TIMES DAILY   acetaminophen 325 MG tablet Commonly known as: TYLENOL Take 2 tablets (650 mg total) by mouth every 6 (six) hours as needed for moderate pain.   atorvastatin 10 MG tablet Commonly known as: LIPITOR Take 1 tablet (10 mg total) by mouth daily.   blood glucose meter kit and supplies Kit Dispense based on patient and insurance preference. Use up to four times daily as directed. E11.9   HumuLIN N 100 UNIT/ML injection Generic drug: insulin NPH Human Inject 0.2 mLs (20 Units total) into the skin 2 (two) times daily before a meal.   Iron (Ferrous Sulfate) 325 (65 Fe) MG Tabs Take 325 mg by mouth every other day. What changed: when to take this   linezolid 600 MG tablet Commonly known as: ZYVOX Take 1 tablet (600 mg total) by mouth every 12 (twelve) hours.   multivitamin tablet Take 1 tablet by mouth daily.   NovoLIN R 100 UNIT/ML injection Generic drug: insulin regular Inject 5-8 Units  into the skin 3 (three) times daily before meals.   UltiCare Insulin Syringe 30G X 1/2" 0.5 ML Misc Generic drug: Insulin Syringe-Needle U-100 Use up to 4 times daily   Vitamin D (Ergocalciferol) 1.25 MG (50000 UNIT) Caps capsule Commonly known as: DRISDOL Take 1 capsule (50,000 Units total) by mouth every 7 (seven) days.         ALLERGIES: Allergies  Allergen Reactions   Latex Itching    Tears skin   Tape Hives     REVIEW OF SYSTEMS: A comprehensive ROS was conducted with the patient and is negative except as per HPI     OBJECTIVE:   VITAL SIGNS: There were no vitals taken for this visit.   PHYSICAL EXAM:  General: Pt appears well and is in NAD  Neck: General: Supple without adenopathy or carotid bruits. Thyroid: Thyroid size normal.   No goiter or nodules appreciated.   Lungs: Clear with good BS bilat   Heart: RRR   Abdomen:  soft, nontender  Extremities:  Lower extremities - trace pretibial edema.   Neuro: MS is good with appropriate affect, pt is alert and Ox3    DM foot exam: 03/25/2023 per podiatry    DATA REVIEWED:  Lab Results  Component Value Date   HGBA1C 9.1 (H) 10/09/2022   HGBA1C 13.1 (H) 02/26/2022   HGBA1C 12.6 (H) 03/31/2021     Latest Reference Range & Units 10/13/22 02:53  Sodium 135 -  145 mmol/L 133 (L)  Potassium 3.5 - 5.1 mmol/L 3.8  Chloride 98 - 111 mmol/L 106  CO2 22 - 32 mmol/L 20 (L)  Glucose 70 - 99 mg/dL 409 (H)  BUN 6 - 20 mg/dL 28 (H)  Creatinine 8.11 - 1.00 mg/dL 9.14 (H)  Calcium 8.9 - 10.3 mg/dL 8.3 (L)  Anion gap 5 - 15  7  GFR, Estimated >60 mL/min >60  (L): Data is abnormally low (H): Data is abnormally high  ASSESSMENT / PLAN / RECOMMENDATIONS:   1) Type 1 Diabetes Mellitus, poorly controlled, With neuropathic,  and macrovascular complications, toe amputations- Most recent A1c of 7.0 %. Goal A1c < 7.0 %.    She is interested in an insulin pump, but unfortunately due to lack of health insurance this is cost prohibitive  MEDICATIONS: Novolin-N 25 units every morning and 20 units at bedtime Novolin-R 12 units 3 times daily before every meal CF: Novolin-R (BG -130/25)  EDUCATION / INSTRUCTIONS: BG monitoring instructions: Patient is instructed to check her blood sugars 3 times a day. Call [facility] if: BG persistently < 70  I reviewed the Rule of 15 for the treatment of hypoglycemia in detail with the patient. Literature supplied.   2) Diabetic complications:  Eye: Does  have known diabetic retinopathy.  Neuro/ Feet: Does  have known diabetic peripheral neuropathy. Renal: Patient does  have known baseline CKD. She is not on an ACEI/ARB at present.  3)Dyslipidemia:  -Historically, LDL above goal -Discussed cardiovascular benefits of statin  therapy -She initially declined statin therapy but by the end of the visit she agreed to start atorvastatin   Medication Start atorvastatin 10 mg daily   Follow-up in 3 months   Signed electronically by: Lyndle Herrlich, MD  [facility] Endocrinology  [facility] [address] [place], [place] [address] Phone: [phone] FAX: [phone]   CC: Sandford Craze, NP [address] [place] [address] Phone: [phone]  Fax: [phone]    Return to Endocrinology clinic as below: Future Appointments  Date Time Provider Department Center  05/17/2023  8:10 AM Johnthomas Lader, Konrad Dolores, MD [id number] None

## 2023-11-07 DEATH — deceased

## 2023-12-08 DEATH — deceased

## 2024-04-03 ENCOUNTER — Other Ambulatory Visit (HOSPITAL_COMMUNITY): Payer: Self-pay
